# Patient Record
Sex: Male | Born: 1970 | Race: White | Hispanic: No | Marital: Single | Smoking: Never smoker
Health system: Southern US, Community
[De-identification: ages and names within clinical notes are randomized; demographics above are authoritative.]

## PROBLEM LIST (undated history)

## (undated) DIAGNOSIS — R319 Hematuria, unspecified: Secondary | ICD-10-CM

## (undated) DIAGNOSIS — K219 Gastro-esophageal reflux disease without esophagitis: Secondary | ICD-10-CM

## (undated) DIAGNOSIS — G4730 Sleep apnea, unspecified: Secondary | ICD-10-CM

## (undated) DIAGNOSIS — N200 Calculus of kidney: Secondary | ICD-10-CM

## (undated) DIAGNOSIS — I10 Essential (primary) hypertension: Secondary | ICD-10-CM

## (undated) DIAGNOSIS — N209 Urinary calculus, unspecified: Secondary | ICD-10-CM

## (undated) HISTORY — DX: Gastro-esophageal reflux disease without esophagitis: K21.9

## (undated) HISTORY — PX: KNEE SURGERY: SHX244

## (undated) HISTORY — DX: Hematuria, unspecified: R31.9

## (undated) HISTORY — DX: Sleep apnea, unspecified: G47.30

---

## 2008-01-26 ENCOUNTER — Ambulatory Visit: Payer: Self-pay | Admitting: Family Medicine

## 2008-05-31 ENCOUNTER — Emergency Department: Payer: Self-pay | Admitting: Emergency Medicine

## 2012-08-01 HISTORY — PX: LAPAROSCOPIC GASTRIC SLEEVE RESECTION: SHX5895

## 2013-02-08 ENCOUNTER — Ambulatory Visit: Payer: Self-pay | Admitting: Orthopedic Surgery

## 2013-03-12 ENCOUNTER — Ambulatory Visit: Payer: Self-pay | Admitting: Orthopedic Surgery

## 2013-03-19 ENCOUNTER — Ambulatory Visit: Payer: Self-pay | Admitting: Orthopedic Surgery

## 2013-04-10 ENCOUNTER — Other Ambulatory Visit (HOSPITAL_COMMUNITY): Payer: Self-pay | Admitting: Marriage and Family Therapist

## 2013-04-10 ENCOUNTER — Other Ambulatory Visit: Payer: Self-pay | Admitting: Bariatrics

## 2013-04-11 ENCOUNTER — Ambulatory Visit: Payer: Self-pay | Admitting: Bariatrics

## 2013-04-11 LAB — COMPREHENSIVE METABOLIC PANEL
Anion Gap: 8 (ref 7–16)
Bilirubin,Total: 0.5 mg/dL (ref 0.2–1.0)
Chloride: 107 mmol/L (ref 98–107)
Co2: 24 mmol/L (ref 21–32)
Creatinine: 0.87 mg/dL (ref 0.60–1.30)
Sodium: 139 mmol/L (ref 136–145)
Total Protein: 7.3 g/dL (ref 6.4–8.2)

## 2013-04-11 LAB — CBC WITH DIFFERENTIAL/PLATELET
Eosinophil #: 0.4 10*3/uL (ref 0.0–0.7)
Eosinophil %: 2.7 %
HCT: 47.1 % (ref 40.0–52.0)
Lymphocyte %: 18.1 %
MCHC: 33.2 g/dL (ref 32.0–36.0)
MCV: 79 fL — ABNORMAL LOW (ref 80–100)
Monocyte %: 6.9 %
RDW: 13.9 % (ref 11.5–14.5)
WBC: 13.4 10*3/uL — ABNORMAL HIGH (ref 3.8–10.6)

## 2013-04-11 LAB — IRON AND TIBC
Iron Bind.Cap.(Total): 389 ug/dL (ref 250–450)
Iron Saturation: 17 %
Iron: 66 ug/dL (ref 65–175)

## 2013-04-11 LAB — PROTIME-INR: Prothrombin Time: 12.8 secs (ref 11.5–14.7)

## 2013-04-11 LAB — FOLATE: Folic Acid: 12.1 ng/mL (ref 3.1–100.0)

## 2013-04-11 LAB — TSH: Thyroid Stimulating Horm: 1.4 u[IU]/mL

## 2013-04-11 LAB — AMYLASE: Amylase: 38 U/L (ref 25–115)

## 2013-04-22 ENCOUNTER — Other Ambulatory Visit: Payer: Self-pay

## 2013-04-25 ENCOUNTER — Ambulatory Visit: Payer: Self-pay | Admitting: Bariatrics

## 2013-05-01 ENCOUNTER — Ambulatory Visit: Payer: Self-pay | Admitting: Bariatrics

## 2013-05-03 ENCOUNTER — Ambulatory Visit: Payer: Self-pay | Admitting: Gastroenterology

## 2013-05-23 ENCOUNTER — Emergency Department: Payer: Self-pay | Admitting: Emergency Medicine

## 2013-05-23 LAB — COMPREHENSIVE METABOLIC PANEL
Albumin: 4.1 g/dL (ref 3.4–5.0)
Alkaline Phosphatase: 96 U/L (ref 50–136)
Anion Gap: 12 (ref 7–16)
BUN: 22 mg/dL — ABNORMAL HIGH (ref 7–18)
Bilirubin,Total: 0.4 mg/dL (ref 0.2–1.0)
Calcium, Total: 10 mg/dL (ref 8.5–10.1)
EGFR (Non-African Amer.): >60
Osmolality: 285 (ref 275–301)
Potassium: 3.5 mmol/L (ref 3.5–5.1)
SGOT(AST): 21 U/L (ref 15–37)
Total Protein: 7.8 g/dL (ref 6.4–8.2)

## 2013-05-23 LAB — CBC WITH DIFFERENTIAL/PLATELET
Basophil #: 0.2 10*3/uL — ABNORMAL HIGH (ref 0.0–0.1)
Basophil %: 1.1 %
Eosinophil %: 2.5 %
HGB: 16 g/dL (ref 13.0–18.0)
Lymphocyte %: 37.1 %
MCHC: 32.8 g/dL (ref 32.0–36.0)
MCV: 79 fL — ABNORMAL LOW (ref 80–100)
Monocyte #: 1.2 x10 3/mm — ABNORMAL HIGH (ref 0.2–1.0)
Neutrophil #: 7.1 10*3/uL — ABNORMAL HIGH (ref 1.4–6.5)
Neutrophil %: 50.7 %
Platelet: 410 10*3/uL (ref 150–440)
RDW: 14 % (ref 11.5–14.5)

## 2013-05-23 LAB — URINALYSIS, COMPLETE
Bacteria: NONE SEEN
Bilirubin,UR: NEGATIVE
Glucose,UR: NEGATIVE mg/dL (ref 0–75)
Nitrite: NEGATIVE
Specific Gravity: 1.027 (ref 1.003–1.030)
Squamous Epithelial: <1

## 2013-05-23 LAB — LIPASE, BLOOD: Lipase: 71 U/L — ABNORMAL LOW (ref 73–393)

## 2013-06-13 DIAGNOSIS — R350 Frequency of micturition: Secondary | ICD-10-CM | POA: Insufficient documentation

## 2013-06-13 DIAGNOSIS — R351 Nocturia: Secondary | ICD-10-CM | POA: Insufficient documentation

## 2014-11-21 NOTE — Op Note (Signed)
PATIENT NAME:  [name], [name] MR#:  [id number] DATE OF BIRTH:  04-19-1971  DATE OF PROCEDURE:  03/19/2013  PREOPERATIVE DIAGNOSES: Degenerative changes, right knee; degenerative tear, medial meniscus, lateral meniscal fraying.   POSTOPERATIVE DIAGNOSES:  Tricompartmental cartilage wear, deficient posterior horn, medial meniscus; moderate fraying of free edge of lateral meniscus of the body and posterior horn.   SURGEON: Murlean HarkShalini Mahad Newstrom, M.D.   ASSISTANT: None.   ANESTHESIA: General anesthesia.   TOURNIQUET TIME: Tourniquet was not inflated.   FINDINGS: Arthroscopy reveals  grade III chondral wear, medial compartment; grade IV chondral wear, trochlear groove; grade III chondral wear, lateral tibial plateau; moderate fraying of the body and posterior horn of the lateral meniscus, posterior horn of the medial meniscus was found to be stable but very thin.   COMPLICATIONS: No immediate intraoperative or postoperative complications were noted.   INDICATIONS FOR PROCEDURE: [name] is a 44 year old gentleman who presented to my office multiple times over the past several months for evaluation of knee pain. After undergoing an extensive conservative course of treatment, including corticosteroid injections and extensive physical therapy and anti-inflammatory treatment, he ultimately decided to pursue arthroscopic intervention, given his ongoing pain and dysfunction. Risks and benefits were explained to the patient. Informed consent was obtained.   DESCRIPTION OF PROCEDURE: [name] was identified in the preoperative holding area. Right lower extremity was marked as the operative site. Informed consent was reviewed and questions are answered.   The patient was brought into the operating room and placed on the table in supine position. General anesthesia was administered. Right lower extremity had a tourniquet applied. The right lower extremity was prepared and draped in the usual sterile  fashion.   Surgical timeout was performed identifying the patient, procedure, confirming antibiotics, confirming images, confirming laterality, confirming consent form, administration of preoperative antibiotics and allergies.   Standard lateral viewing portal was made and arthroscope was inserted. Under direct visualization, medial portal was made and probe was inserted. Due to extensive synovitis in the patellofemoral articulation, the medial compartment was evaluated first. Medial compartment demonstrated grade III chondral wear on both the femoral side and the tibial side. There was extensive fraying of the femoral condyle. Evaluation of the medial meniscus revealed the body and anterior portion relatively well preserved. The posterior portion demonstrated no evidence of significant or unstable tearing, however, did demonstrate a very deficient posterior horn that was quite thin. Attention was now turned to the notch. ACL and PCL were found to be intact. Attention was turned to the lateral compartment. Lateral femoral condyle was relatively well preserved with only slight softening of the cartilage. The tibial plateau was significantly more worn with grade III cartilage wear and extensive fraying. Lateral meniscus demonstrated moderate fraying of the free edge of the posterior horn and mild fraying of the free edge of the body. At this time, a shaver was inserted. Collene MaresShaver was used to smooth the free edge of the lateral meniscus back to a clean, stable border. A full-radius shaver was then inserted to perform a chondroplasty of the lateral tibial plateau, as well as the medial femoral condyle and medial tibial plateau. Probe was used to assure that no unstable fragments of cartilage remained. Attention was now turned to the anterior portion of the joint, and an extensive synovectomy was carried out to improve visualization of the patellofemoral articulation. Evaluation of the patellofemoral articulation  revealed a large full-thickness cartilage flap in the trochlear groove. The flap was completely unstable along its course.  It measured approximately 1.5 cm in length and 1 cm in width. A full-radius chondral shaver was used to debride this back to a stable edge. The undersurface of the patella demonstrated grade I to II cartilage changes without extensive fraying. Medial and lateral gutters were checked for loose bodies. The joint was copiously irrigated and drained of excess fluid. At this time, the arthroscope and instruments were removed from the joint and portals were closed using nylon suture. Marcaine 0.25% was inserted subcutaneously. Sterile dressings were applied. The patient was placed in a hinged knee brace locked in extension. He will follow up in 2 days and begin physical therapy then.      ____________________________ Murlean Hark, MD sr:dmm D: 03/21/2013 10:02:25 ET T: 03/21/2013 10:14:51 ET JOB#: [id number]  cc: Murlean Hark, MD, <Dictator> Murlean Hark MD ELECTRONICALLY SIGNED 03/26/2013 10:26

## 2016-01-20 ENCOUNTER — Encounter: Payer: Self-pay | Admitting: Urology

## 2016-01-20 ENCOUNTER — Ambulatory Visit (INDEPENDENT_AMBULATORY_CARE_PROVIDER_SITE_OTHER): Payer: BC Managed Care – PPO | Admitting: Urology

## 2016-01-20 VITALS — BP 132/87 | HR 64 | Ht 66.0 in | Wt 193.0 lb

## 2016-01-20 DIAGNOSIS — R310 Gross hematuria: Secondary | ICD-10-CM

## 2016-01-20 DIAGNOSIS — Z87442 Personal history of urinary calculi: Secondary | ICD-10-CM | POA: Diagnosis not present

## 2016-01-20 LAB — URINALYSIS, COMPLETE
BILIRUBIN UA: NEGATIVE
GLUCOSE, UA: NEGATIVE
KETONES UA: NEGATIVE
Leukocytes, UA: NEGATIVE
NITRITE UA: NEGATIVE
Protein, UA: NEGATIVE
SPEC GRAV UA: 1.025 (ref 1.005–1.030)
UUROB: 0.2 mg/dL (ref 0.2–1.0)
pH, UA: 7 (ref 5.0–7.5)

## 2016-01-20 LAB — MICROSCOPIC EXAMINATION
Bacteria, UA: NONE SEEN
EPITHELIAL CELLS (NON RENAL): NONE SEEN /HPF (ref 0–10)

## 2016-01-20 NOTE — Progress Notes (Addendum)
01/20/2016 3:24 PM   Lucila MainePeter W Chojnowski [birth year] [id number]  Referring provider: Dione HousekeeperMario Ernesto Olmedo, MD [address] [place], [place] [id number]  Chief Complaint  Patient presents with  . Hematuria    referred by   . New Patient (Initial Visit)    HPI: Patient is a 45 -year-old Caucasian male who presents today as a referral from their PCP, Olmedo, for gross hematuria.    Patient doesn't have a prior history of microscopic hematuria.    He does not have a prior history of recurrent urinary tract infections,  trauma to the genitourinary tract, BPH or malignancies of the genitourinary tract.  He does have a history of nephrolithiasis.    He does not have a family medical history of nephrolithiasis or hematuria.  Father was diagnosed with prostate cancer at age 47.  Today, he is not having symptoms of frequent urination, urgency, dysuria, nocturia, incontinence, hesitancy, intermittency, straining to urinate or a weak urinary stream.  His UA today demonstrates 0-2 RBC's/hpf.  He had been experiencing one month of suprapubic discomfort and intermittent episodes of gross hematuria.Marland Kitchen.  He is not experiencing any suprapubic pain, abdominal pain or flank pain. He denies any recent fevers, chills, nausea or vomiting.   He did have a non contrast CT which demonstrated mild hydronephrosis and hydroureter secondary to a 1 mm stone. had any recent imaging studies.     He is not a smoker.   He is not exposed to secondhand smoke.  He has not worked with [employer].    PMH: Past Medical History  Diagnosis Date  . Acid reflux   . Sleep apnea   . Hematuria     Surgical History: Past Surgical History  Procedure Laterality Date  . Laparoscopic gastric sleeve resection    . Knee surgery      Home Medications:    Medication List       This list is accurate as of: 01/20/16  3:24 PM.  Always use your most recent med list.               omeprazole 20 MG capsule    Commonly known as:  PRILOSEC  Take by mouth.        Allergies:  Allergies  Allergen Reactions  . Penicillins Itching and Rash    Family History: Family History  Problem Relation Age of Onset  . Prostate cancer Father   . Kidney disease Neg Hx   . Kidney cancer Neg Hx     Social History:  reports that he has never smoked. He does not have any smokeless tobacco history on file. He reports that he drinks alcohol. He reports that he does not use illicit drugs.  ROS: UROLOGY Frequent Urination?: No Hard to postpone urination?: No Burning/pain with urination?: No Get up at night to urinate?: No Leakage of urine?: No Urine stream starts and stops?: No Trouble starting stream?: No Do you have to strain to urinate?: No Blood in urine?: Yes Urinary tract infection?: No Sexually transmitted disease?: No Injury to kidneys or bladder?: No Painful intercourse?: No Weak stream?: No Erection problems?: No Penile pain?: No  Gastrointestinal Nausea?: No Vomiting?: No Indigestion/heartburn?: Yes Diarrhea?: No Constipation?: No  Constitutional Fever: No Night sweats?: No Weight loss?: No Fatigue?: No  Skin Skin rash/lesions?: No Itching?: No  Eyes Blurred vision?: No Double vision?: No  Ears/Nose/Throat Sore throat?: No Sinus problems?: No  Hematologic/Lymphatic Swollen glands?: No Easy bruising?: No  Cardiovascular  Leg swelling?: No Chest pain?: No  Respiratory Cough?: No Shortness of breath?: No  Endocrine Excessive thirst?: No  Musculoskeletal Back pain?: No Joint pain?: No  Neurological Headaches?: Yes Dizziness?: No  Psychologic Depression?: No Anxiety?: No  Physical Exam: BP 132/87 mmHg  Pulse 64  Ht  (1.676 m)  Wt 193 lb (87.544 kg)  BMI 31.17 kg/m2  Constitutional: Well nourished. Alert and oriented, No acute distress. HEENT: [place] AT, moist mucus membranes. Trachea midline, no masses. Cardiovascular: No clubbing,  cyanosis, or edema. Respiratory: Normal respiratory effort, no increased work of breathing. GI: Abdomen is soft, non tender, non distended, no abdominal masses. Liver and spleen not palpable.  No hernias appreciated.  Stool sample for occult testing is not indicated.   GU: No CVA tenderness.  No bladder fullness or masses.  Patient with circumcised phallus.  Urethral meatus is patent.  No penile discharge. No penile lesions or rashes. Scrotum without lesions, cysts, rashes and/or edema.  Testicles are located scrotally bilaterally. No masses are appreciated in the testicles. Left and right epididymis are normal. Rectal: Patient with  normal sphincter tone. Anus and perineum without scarring or rashes. No rectal masses are appreciated. Prostate is approximately 45 grams, no nodules are appreciated. Seminal vesicles are normal. Skin: No rashes, bruises or suspicious lesions. Lymph: No cervical or inguinal adenopathy. Neurologic: Grossly intact, no focal deficits, moving all 4 extremities. Psychiatric: Normal mood and affect.  Laboratory Data: Lab Results  Component Value Date   WBC 14.0* 05/23/2013   HGB 16.0 05/23/2013   HCT 48.8 05/23/2013   MCV 79* 05/23/2013   PLT 410 05/23/2013    Lab Results  Component Value Date   CREATININE 1.07 05/23/2013     Lab Results  Component Value Date   HGBA1C 5.7 04/11/2013    Lab Results  Component Value Date   TSH 1.40 04/11/2013      Lab Results  Component Value Date   AST 21 05/23/2013   Lab Results  Component Value Date   ALT 49 05/23/2013     Urinalysis Results for orders placed or performed in visit on 01/20/16  CULTURE, URINE COMPREHENSIVE  Result Value Ref Range   Urine Culture, Comprehensive Final report    Result 1 Comment   Microscopic Examination  Result Value Ref Range   WBC, UA 0-5 0 -  5 /hpf   RBC, UA 0-2 0 -  2 /hpf   Epithelial Cells (non renal) None seen 0 - 10 /hpf   Bacteria, UA None seen None seen/Few   Urinalysis, Complete  Result Value Ref Range   Specific Gravity, UA 1.025 1.005 - 1.030   pH, UA 7.0 5.0 - 7.5   Color, UA Yellow Yellow   Appearance Ur Clear Clear   Leukocytes, UA Negative Negative   Protein, UA Negative Negative/Trace   Glucose, UA Negative Negative   Ketones, UA Negative Negative   RBC, UA 1+ (A) Negative   Bilirubin, UA Negative Negative   Urobilinogen, Ur 0.2 0.2 - 1.0 mg/dL   Nitrite, UA Negative Negative   Microscopic Examination See below:   BUN+Creat  Result Value Ref Range   BUN 14 6 - 24 mg/dL   Creatinine, Ser 7.82 0.76 - 1.27 mg/dL   GFR calc non Af Amer 107 >59 mL/min/1.73   GFR calc Af Amer 124 >59 mL/min/1.73   BUN/Creatinine Ratio 18 9 - 20    Pertinent Imaging: PRIOR REPORT IMPORTED FROM THE [employer] WORKFLOW SYSTEM  REASON FOR EXAM: flank pain  COMMENTS:   PROCEDURE: CT - CT ABDOMEN /PELVIS WO (STONE) - May 23 2013 6:53PM   RESULT: Axial noncontrast CT scanning was performed through the  abdomen  and pelvis with reconstructions at 3 mm intervals and slice thicknesses.  Review of multiplanar reconstructed images was performed separately on the  VIA monitor.   There is mild hydronephrosis and hydroureter on the left. This is due to a  1  mm diameter stone on image 134 in the distal left ureter. No other stones  are demonstrated on the left. There is likely an exophytic cyst from the  lower pole posterior laterally. On the right there is a punctate mid to  upper pole stone not producing obstruction. The partially distended  urinary  bladder is normal in appearance.   The liver, gallbladder, spleen, partially distended stomach, pancreas, and  adrenal glands are normal in appearance. The caliber of the abdominal  aorta  is normal. There is a retroaortic left renal vein. Unopacified loops of  small and large bowel exhibit no evidence of ileus nor of obstruction.   The lumbar vertebral bodies  are preserved in height. The lung bases are  clear.   IMPRESSION:  1. There is mild hydronephrosis and hydroureter secondary to a 1 mm  diameter  stone in the distal third of the left ureter visible on image 134. No  other  stones on the left are demonstrated.  2. On the right there is a punctate mid upper pole stone not producing  obstruction.  3. There is no acute hepatobiliary abnormality nor acute bowel  abnormality.  4. There is no intra-abdominal nor pelvic lymphadenopathy or inflammatory  change.    Assessment & Plan:    1. Gross hematuria:    I explained to the patient that there are a number of causes that can be associated with blood in the urine, such as stones,  BPH, UTI's, damage to the urinary tract and/or cancer.  At this time, I felt that the patient warranted further urologic evaluation.   The AUA guidelines state that a CT urogram is the preferred imaging study to evaluate hematuria.  I explained to the patient that a contrast material will be injected into a vein and that in rare instances, an allergic reaction can result and may even life threatening   The patient denies any allergies to contrast, iodine and/or seafood and is not taking metformin.  Following the imaging study,  I've recommended a cystoscopy. I described how this is performed, typically in an office setting with a flexible cystoscope. We described the risks, benefits, and possible side effects, the most common of which is a minor amount of blood in the urine and/or burning which usually resolves in 24 to 48 hours.   The patient had the opportunity to ask questions which were answered. Based upon this discussion, the patient is willing to proceed. Therefore, I've ordered: a CT Urogram and cystoscopy.  He will return following all of the above for discussion of the results.     - Urinalysis, Complete - CULTURE, URINE COMPREHENSIVE - BUN+Creat  2. History of nephrolithiasis:   Stone  composition is unknown at this time.  See above.     Return for CT Urogram report and cystoscopy for hematuria.  These notes generated with voice recognition software. I apologize for typographical errors.  Michiel Cowboy, PA-C  [facility] [address], [address] [place], [place] [address] [phone]  02/20/2016: Addendum-patient was seen in the emergency room at [facility] on 02/19/2016. He was found to have a 3 mm left UVJ stone and a right UPJ stone. Patient is encouraged to drink copious amounts of fluid (2.5 mL) and continue tamsulosin for medical expulsive therapy for the left UVJ stone.  Patient will be traveling out of the state next week.   When he returns, he will undergo right ESWL for the right UPJ stone. The procedure is explained to the patient.  I stated that the ESWL may not be successful in fragmenting the stones due to the position and size. I also explained the phenomenon of Ste[place]rausse with the stones that would require a ureteral stent placement. The effectiveness of the treatment should reach 88%.   His questions are answered and he wishes to proceed.

## 2016-01-21 LAB — BUN+CREAT
BUN / CREAT RATIO: 18 (ref 9–20)
BUN: 14 mg/dL (ref 6–24)
CREATININE: 0.78 mg/dL (ref 0.76–1.27)
GFR calc Af Amer: 124 mL/min/{1.73_m2} (ref >59)
GFR, EST NON AFRICAN AMERICAN: 107 mL/min/{1.73_m2} (ref >59)

## 2016-01-24 LAB — CULTURE, URINE COMPREHENSIVE

## 2016-01-25 ENCOUNTER — Telehealth: Payer: Self-pay | Admitting: Urology

## 2016-01-25 NOTE — Telephone Encounter (Signed)
Would you please send a copy of this office visit to the patient's referring provider, Dr. Olmedo?     

## 2016-01-25 NOTE — Telephone Encounter (Signed)
faxed

## 2016-02-03 ENCOUNTER — Ambulatory Visit
Admission: RE | Admit: 2016-02-03 | Discharge: 2016-02-03 | Disposition: A | Discharge status: Home / Self Care | Payer: Self-pay | Source: Ambulatory Visit | Attending: Urology | Admitting: Urology

## 2016-02-03 DIAGNOSIS — N281 Cyst of kidney, acquired: Secondary | ICD-10-CM | POA: Insufficient documentation

## 2016-02-03 DIAGNOSIS — N200 Calculus of kidney: Secondary | ICD-10-CM | POA: Insufficient documentation

## 2016-02-03 DIAGNOSIS — Z9049 Acquired absence of other specified parts of digestive tract: Secondary | ICD-10-CM | POA: Insufficient documentation

## 2016-02-03 DIAGNOSIS — Z9884 Bariatric surgery status: Secondary | ICD-10-CM | POA: Insufficient documentation

## 2016-02-03 DIAGNOSIS — R310 Gross hematuria: Secondary | ICD-10-CM | POA: Insufficient documentation

## 2016-02-03 MED ORDER — IOPAMIDOL (ISOVUE-370) INJECTION 76%
125.0000 mL | Freq: Once | INTRAVENOUS | Status: AC | PRN
  Administered 2016-02-03: 125 mL via INTRAVENOUS

## 2016-02-08 ENCOUNTER — Telehealth: Payer: Self-pay

## 2016-02-08 ENCOUNTER — Telehealth: Payer: Self-pay | Admitting: Urology

## 2016-02-08 NOTE — Telephone Encounter (Signed)
Spoke with patient and he agreed  [name]

## 2016-02-08 NOTE — Telephone Encounter (Signed)
-----   Message from Harle BattiestShannon A McGowan, PA-C sent at 02/03/2016  5:23 PM EDT ----- Please let the patient know that he has a stone in his right kidney. I do strongly encouraged him to keep his cystoscopic appointment on August 2, so that he can discuss possible treatment for the stone

## 2016-02-08 NOTE — Telephone Encounter (Signed)
LMOM- stone in right kidney. Keep Aug 2 appt.

## 2016-02-19 ENCOUNTER — Emergency Department: Payer: BC Managed Care – PPO

## 2016-02-19 ENCOUNTER — Encounter: Payer: Self-pay | Admitting: Urgent Care

## 2016-02-19 ENCOUNTER — Telehealth: Payer: Self-pay

## 2016-02-19 ENCOUNTER — Emergency Department
Admission: EM | Admit: 2016-02-19 | Discharge: 2016-02-19 | Disposition: A | Discharge status: Home / Self Care | Payer: BC Managed Care – PPO | Attending: Emergency Medicine | Admitting: Emergency Medicine

## 2016-02-19 DIAGNOSIS — Z79899 Other long term (current) drug therapy: Secondary | ICD-10-CM | POA: Insufficient documentation

## 2016-02-19 DIAGNOSIS — N200 Calculus of kidney: Secondary | ICD-10-CM | POA: Insufficient documentation

## 2016-02-19 HISTORY — DX: Urinary calculus, unspecified: N20.9

## 2016-02-19 LAB — CBC
HEMATOCRIT: 42.5 % (ref 40.0–52.0)
HEMOGLOBIN: 14.2 g/dL (ref 13.0–18.0)
MCH: 27.6 pg (ref 26.0–34.0)
MCHC: 33.5 g/dL (ref 32.0–36.0)
MCV: 82.4 fL (ref 80.0–100.0)
Platelets: 295 10*3/uL (ref 150–440)
RBC: 5.16 MIL/uL (ref 4.40–5.90)
RDW: 13.1 % (ref 11.5–14.5)
WBC: 8.1 10*3/uL (ref 3.8–10.6)

## 2016-02-19 LAB — BASIC METABOLIC PANEL
Anion gap: 8 (ref 5–15)
BUN: 22 mg/dL — AB (ref 6–20)
CHLORIDE: 105 mmol/L (ref 101–111)
CO2: 28 mmol/L (ref 22–32)
CREATININE: 0.91 mg/dL (ref 0.61–1.24)
Calcium: 9.2 mg/dL (ref 8.9–10.3)
GFR calc Af Amer: >60 mL/min (ref >60)
GFR calc non Af Amer: >60 mL/min (ref >60)
GLUCOSE: 125 mg/dL — AB (ref 65–99)
Potassium: 3.7 mmol/L (ref 3.5–5.1)
SODIUM: 141 mmol/L (ref 135–145)

## 2016-02-19 LAB — URINALYSIS COMPLETE WITH MICROSCOPIC (ARMC ONLY)
BACTERIA UA: NONE SEEN
Bilirubin Urine: NEGATIVE
Glucose, UA: NEGATIVE mg/dL
Ketones, ur: NEGATIVE mg/dL
Leukocytes, UA: NEGATIVE
Nitrite: NEGATIVE
PH: 7 (ref 5.0–8.0)
PROTEIN: 30 mg/dL — AB
Specific Gravity, Urine: 1.015 (ref 1.005–1.030)
Squamous Epithelial / LPF: NONE SEEN

## 2016-02-19 MED ORDER — ONDANSETRON HCL 4 MG/2ML IJ SOLN
4.0000 mg | Freq: Once | INTRAMUSCULAR | Status: AC
  Administered 2016-02-19: 4 mg via INTRAVENOUS

## 2016-02-19 MED ORDER — KETOROLAC TROMETHAMINE 30 MG/ML IJ SOLN
30.0000 mg | Freq: Once | INTRAMUSCULAR | Status: AC
  Administered 2016-02-19: 30 mg via INTRAVENOUS
  Filled 2016-02-19: qty 1

## 2016-02-19 MED ORDER — TAMSULOSIN HCL 0.4 MG PO CAPS
0.4000 mg | ORAL_CAPSULE | Freq: Once | ORAL | Status: AC
  Administered 2016-02-19: 0.4 mg via ORAL
  Filled 2016-02-19: qty 1

## 2016-02-19 MED ORDER — MORPHINE SULFATE (PF) 4 MG/ML IV SOLN
4.0000 mg | Freq: Once | INTRAVENOUS | Status: AC
Start: 2016-02-19 — End: 2016-02-19
  Administered 2016-02-19: 4 mg via INTRAVENOUS

## 2016-02-19 MED ORDER — ONDANSETRON HCL 4 MG/2ML IJ SOLN
INTRAMUSCULAR | Status: AC
  Administered 2016-02-19: 4 mg via INTRAVENOUS
  Filled 2016-02-19: qty 2

## 2016-02-19 MED ORDER — SODIUM CHLORIDE 0.9 % IV BOLUS (SEPSIS)
1000.0000 mL | Freq: Once | INTRAVENOUS | Status: AC
  Administered 2016-02-19: 1000 mL via INTRAVENOUS

## 2016-02-19 MED ORDER — MORPHINE SULFATE (PF) 4 MG/ML IV SOLN
INTRAVENOUS | Status: AC
  Administered 2016-02-19: 4 mg via INTRAVENOUS
  Filled 2016-02-19: qty 1

## 2016-02-19 MED ORDER — TAMSULOSIN HCL 0.4 MG PO CAPS: 0.4000 mg | ORAL_CAPSULE | Freq: Every day | ORAL | Status: DC

## 2016-02-19 MED ORDER — OXYCODONE-ACETAMINOPHEN 5-325 MG PO TABS: 1.0000 | ORAL_TABLET | ORAL | Status: DC | PRN

## 2016-02-19 MED ORDER — ONDANSETRON 4 MG PO TBDP
4.0000 mg | ORAL_TABLET | Freq: Three times a day (TID) | ORAL | Status: DC | PRN
Start: 2016-02-19 — End: 2016-03-24

## 2016-02-19 NOTE — ED Provider Notes (Signed)
[facility] Emergency Department Provider Note  ____________________________________________  Time seen: 3:15 AM I have reviewed the triage vital signs and the nursing notes.   HISTORY  Chief Complaint Flank Pain      HPI LAMOUNT BANKSON is a 45 y.o. male presents with left flank pain acute onset 2 hours accompanied with nausea and vomiting. Patient states known right sided urolithiasis for which she seen [facility] urology with follow-up appointment scheduled for 2 weeks. Patient admits to hematuria.. Patient denies any fever. Patient states that he was not prescribed anything for pain or Flomax.     Past Medical History  Diagnosis Date  . Acid reflux   . Sleep apnea   . Hematuria   . Urolithiasis     There are no active problems to display for this patient.   Past Surgical History  Procedure Laterality Date  . Laparoscopic gastric sleeve resection    . Knee surgery      Current Outpatient Rx  Name  Route  Sig  Dispense  Refill  . omeprazole (PRILOSEC) 20 MG capsule   Oral   Take by mouth.           Allergies Penicillins  Family History  Problem Relation Age of Onset  . Prostate cancer Father   . Kidney disease Neg Hx   . Kidney cancer Neg Hx     Social History Social History  Substance Use Topics  . Smoking status: Never Smoker   . Smokeless tobacco: None  . Alcohol Use: 0.0 oz/week    0 Standard drinks or equivalent per week    Review of Systems  Constitutional: Negative for fever. Eyes: Negative for visual changes. ENT: Negative for sore throat. Cardiovascular: Negative for chest pain. Respiratory: Negative for shortness of breath. Gastrointestinal: Negative for abdominal pain, vomiting and diarrhea. Genitourinary: Negative for dysuria. Musculoskeletal: Positivefor back pain. Skin: Negative for rash. Neurological: Negative for headaches, focal weakness or numbness.  10-point ROS otherwise  negative.  ____________________________________________   PHYSICAL EXAM:  VITAL SIGNS: ED Triage Vitals  Enc Vitals Group     BP 02/19/16 0111 133/86 mmHg     Pulse Rate 02/19/16 0111 64     Resp 02/19/16 0111 18     Temp 02/19/16 0111 97.5 F (36.4 C)     Temp Source 02/19/16 0111 Oral     SpO2 02/19/16 0111 100 %     Weight 02/19/16 0111 190 lb (86.183 kg)     Height 02/19/16 0111 5\' 6"  (1.676 m)     Head Cir --      Peak Flow --      Pain Score 02/19/16 0112 7     Pain Loc --      Pain Edu? --      Excl. in GC? --      Constitutional: Alert and oriented. Apparent discomfort Eyes: Conjunctivae are normal. PERRL. Normal extraocular movements. ENT   Head: Normocephalic and atraumatic.   Nose: No congestion/rhinnorhea.   Mouth/Throat: Mucous membranes are moist.   Neck: No stridor. Hematological/Lymphatic/Immunilogical: No cervical lymphadenopathy. Cardiovascular: Normal rate, regular rhythm. Normal and symmetric distal pulses are present in all extremities. No murmurs, rubs, or gallops. Respiratory: Normal respiratory effort without tachypnea nor retractions. Breath sounds are clear and equal bilaterally. No wheezes/rales/rhonchi. Gastrointestinal: Soft and nontender. No distention. There is no CVA tenderness. Genitourinary: deferred Musculoskeletal: Nontender with normal range of motion in all extremities. No joint effusions.  No lower extremity tenderness nor  edema. Neurologic:  Normal speech and language. No gross focal neurologic deficits are appreciated. Speech is normal.  Skin:  Skin is warm, dry and intact. No rash noted. Psychiatric: Mood and affect are normal. Speech and behavior are normal. Patient exhibits appropriate insight and judgment.  ____________________________________________    LABS (pertinent positives/negatives)  Labs Reviewed  BASIC METABOLIC PANEL - Abnormal; Notable for the following:    Glucose, Bld 125 (*)    BUN 22 (*)     All other components within normal limits  URINALYSIS COMPLETEWITH MICROSCOPIC ([facility] ONLY) - Abnormal; Notable for the following:    Color, Urine YELLOW (*)    APPearance HAZY (*)    Hgb urine dipstick 3+ (*)    Protein, ur 30 (*)    All other components within normal limits  CBC         RADIOLOGY   CT Renal Stone Study (Final result) Result time: 02/19/16 02:02:10   Final result by Rad Results In Interface (02/19/16 02:02:10)   Narrative:   CLINICAL DATA: Left flank pain  EXAM: CT ABDOMEN AND PELVIS WITHOUT CONTRAST  TECHNIQUE: Multidetector CT imaging of the abdomen and pelvis was performed following the standard protocol without IV contrast.  COMPARISON: February 03, 2016  FINDINGS: There is a hiatal hernia. The lung bases are otherwise normal. No free air or free fluid. Again noted are multiple small stones in the right renal pelvis without active hydronephrosis or obstruction. A few small stones are scattered throughout the right kidney as well. There is a possible subtle stone in the mid left kidney on coronal images. Persistent hydronephrosis on the left, stable in the interval. The proximal left ureter is also prominent. There is a stone in the left ureter on series 2, image 54 measuring 3 mm, in retrospect present previously. There is been slight advancement of the stone down the ureter by only 1 or 2 cm. The ureters are otherwise normal. Multiple cysts and probable hyperdense cysts associated with the left kidney, unchanged.  The patient is status post cholecystectomy. The liver, spleen, adrenal glands, and pancreas are normal. Minimal atherosclerotic change in the non aneurysmal aorta. The patient is status post gastric surgery. The small bowel is normal. The colon is normal in appearance. The appendix is not seen but there is no secondary evidence of appendicitis. No adenopathy.  The pelvis demonstrates no adenopathy or mass. The bladder  is unremarkable. The prostate and seminal vesicles are within normal limits.  The visualized bones demonstrate scattered degenerative changes but no other acute bony abnormalities.  IMPRESSION: 1. There is a 3 mm stone in the left ureter with left-sided hydronephrosis. In retrospect, the stone was present on the February 03, 2016 study. This stone has only migrated distally 1 or 2 cm in the interval. 2. Stones in of the right renal pelvis without active obstruction. The stones could certainly cause intermittent obstruction given their location. 3. Mild atherosclerotic change in the abdominal aorta.   Electronically Signed By: Gerome Sam III M.D On: 02/19/2016 02:02        ECG Results       Procedures     INITIAL IMPRESSION / ASSESSMENT AND PLAN / ED COURSE  Pertinent labs & imaging results that were available during my care of the patient were reviewed by me and considered in my medical decision making (see chart for details).  Patient given morphine 4 mg in the emergency department as well as Zofran. Following confirmation on CT scan patient  received Toradol 30 mg current pain score 2 out of 10 in addition patient was given Flomax  ____________________________________________   FINAL CLINICAL IMPRESSION(S) / ED DIAGNOSES  Final diagnoses:  Bilateral kidney stones      Darci Currentandolph N Roel Douthat, MD 02/19/16 [phone]

## 2016-02-19 NOTE — Discharge Instructions (Signed)
Kidney Stones °Kidney stones (urolithiasis) are deposits that form inside your kidneys. The intense pain is caused by the stone moving through the urinary tract. When the stone moves, the ureter goes into spasm around the stone. The stone is usually passed in the urine.  °CAUSES  °· A disorder that makes certain neck glands produce too much parathyroid hormone (primary hyperparathyroidism). °· A buildup of uric acid crystals, similar to gout in your joints. °· Narrowing (stricture) of the ureter. °· A kidney obstruction present at birth (congenital obstruction). °· Previous surgery on the kidney or ureters. °· Numerous kidney infections. °SYMPTOMS  °· Feeling sick to your stomach (nauseous). °· Throwing up (vomiting). °· Blood in the urine (hematuria). °· Pain that usually spreads (radiates) to the groin. °· Frequency or urgency of urination. °DIAGNOSIS  °· Taking a history and physical exam. °· Blood or urine tests. °· CT scan. °· Occasionally, an examination of the inside of the urinary bladder (cystoscopy) is performed. °TREATMENT  °· Observation. °· Increasing your fluid intake. °· Extracorporeal shock wave lithotripsy--This is a noninvasive procedure that uses shock waves to break up kidney stones. °· Surgery may be needed if you have severe pain or persistent obstruction. There are various surgical procedures. Most of the procedures are performed with the use of small instruments. Only small incisions are needed to accommodate these instruments, so recovery time is minimized. °The size, location, and chemical composition are all important variables that will determine the proper choice of action for you. Talk to your health care provider to better understand your situation so that you will minimize the risk of injury to yourself and your kidney.  °HOME CARE INSTRUCTIONS  °· Drink enough water and fluids to keep your urine clear or pale yellow. This will help you to pass the stone or stone fragments. °· Strain  all urine through the provided strainer. Keep all particulate matter and stones for your health care provider to see. The stone causing the pain may be as small as a grain of salt. It is very important to use the strainer each and every time you pass your urine. The collection of your stone will allow your health care provider to analyze it and verify that a stone has actually passed. The stone analysis will often identify what you can do to reduce the incidence of recurrences. °· Only take over-the-counter or prescription medicines for pain, discomfort, or fever as directed by your health care provider. °· Keep all follow-up visits as told by your health care provider. This is important. °· Get follow-up X-rays if required. The absence of pain does not always mean that the stone has passed. It may have only stopped moving. If the urine remains completely obstructed, it can cause loss of kidney function or even complete destruction of the kidney. It is your responsibility to make sure X-rays and follow-ups are completed. Ultrasounds of the kidney can show blockages and the status of the kidney. Ultrasounds are not associated with any radiation and can be performed easily in a matter of minutes. °· Make changes to your daily diet as told by your health care provider. You may be told to: °¨ Limit the amount of salt that you eat. °¨ Eat 5 or more servings of fruits and vegetables each day. °¨ Limit the amount of meat, poultry, fish, and eggs that you eat. °· Collect a 24-hour urine sample as told by your health care provider. You may need to collect another urine sample every 6-12   months. °SEEK MEDICAL CARE IF: °· You experience pain that is progressive and unresponsive to any pain medicine you have been prescribed. °SEEK IMMEDIATE MEDICAL CARE IF:  °· Pain cannot be controlled with the prescribed medicine. °· You have a fever or shaking chills. °· The severity or intensity of pain increases over 18 hours and is not  relieved by pain medicine. °· You develop a new onset of abdominal pain. °· You feel faint or pass out. °· You are unable to urinate. °  °This information is not intended to replace advice given to you by your health care provider. Make sure you discuss any questions you have with your health care provider. °  °Document Released: 07/18/2005 Document Revised: 04/08/2015 Document Reviewed: 12/19/2012 °Elsevier Interactive Patient Education ©2016 [employer] ° °

## 2016-02-19 NOTE — Telephone Encounter (Signed)
Spoke with patient.  Instructed patient to increase his fluid intake to 2.5 mL daily, take the tamsulosin, oxycodone as needed and nausea medication as needed.  We will address the right pelvic stone when he returns on 03/03/2016 with ESWL.  I advised the patient of the risks of the procedure, particularly the issue of [place].

## 2016-02-19 NOTE — ED Notes (Signed)
Pt discharged to home.  Family member driving.  Discharge instructions reviewed.  Verbalized understanding.  No questions or concerns at this time.  Teach back verified.  Pt in NAD.  No items left in ED.   

## 2016-02-19 NOTE — Telephone Encounter (Signed)
Pt called stating he was in the ER last night due to kidney stone pain. Per pt ER physician told him he has a 3mm obstructing stone on the left side and a 9mm non obstructing stone on the right side. Pt stated "now I am very pissed off! I was told at my last appt that there would be no problem having this stone taken care of before I go to [place]. And now I leave Wednesday and I dont know if I am going to be able to have surgery!" please advise.

## 2016-02-19 NOTE — ED Notes (Signed)
Patient presents with LEFT flank pain with (+) radiation into groin. (+) N/V reported. Symptoms x 2 hours. Patient with known urolithiasis; had imaging done x 2 weeks ago; states, "I know that there are stones in there".

## 2016-03-02 ENCOUNTER — Ambulatory Visit: Payer: BC Managed Care – PPO

## 2016-03-03 ENCOUNTER — Ambulatory Visit
Admission: RE | Admit: 2016-03-03 | Discharge: 2016-03-03 | Disposition: A | Discharge status: Home / Self Care | Payer: BLUE CROSS/BLUE SHIELD | Source: Ambulatory Visit | Attending: Urology | Admitting: Urology

## 2016-03-03 ENCOUNTER — Encounter
Admission: RE | Disposition: A | Discharge status: Home / Self Care | Payer: Self-pay | Source: Ambulatory Visit | Attending: Urology

## 2016-03-03 ENCOUNTER — Encounter: Payer: Self-pay | Admitting: *Deleted

## 2016-03-03 ENCOUNTER — Ambulatory Visit: Payer: BLUE CROSS/BLUE SHIELD

## 2016-03-03 DIAGNOSIS — N200 Calculus of kidney: Secondary | ICD-10-CM | POA: Diagnosis not present

## 2016-03-03 DIAGNOSIS — Z9884 Bariatric surgery status: Secondary | ICD-10-CM | POA: Insufficient documentation

## 2016-03-03 HISTORY — PX: EXTRACORPOREAL SHOCK WAVE LITHOTRIPSY: SHX1557

## 2016-03-03 SURGERY — LITHOTRIPSY, ESWL
Anesthesia: Moderate Sedation | Laterality: Right

## 2016-03-03 MED ORDER — CIPROFLOXACIN HCL 500 MG PO TABS
ORAL_TABLET | ORAL | Status: AC
  Administered 2016-03-03: 500 mg via ORAL
  Filled 2016-03-03: qty 1

## 2016-03-03 MED ORDER — DIPHENHYDRAMINE HCL 25 MG PO CAPS
ORAL_CAPSULE | ORAL | Status: AC
  Administered 2016-03-03: 25 mg via ORAL
  Filled 2016-03-03: qty 1

## 2016-03-03 MED ORDER — DIAZEPAM 5 MG PO TABS
ORAL_TABLET | ORAL | Status: AC
  Administered 2016-03-03: 10 mg via ORAL
  Filled 2016-03-03: qty 2

## 2016-03-03 MED ORDER — DIAZEPAM 5 MG PO TABS
10.0000 mg | ORAL_TABLET | ORAL | Status: AC
  Administered 2016-03-03: 10 mg via ORAL

## 2016-03-03 MED ORDER — CIPROFLOXACIN HCL 500 MG PO TABS
500.0000 mg | ORAL_TABLET | ORAL | Status: AC
  Administered 2016-03-03: 500 mg via ORAL

## 2016-03-03 MED ORDER — DIPHENHYDRAMINE HCL 25 MG PO CAPS
25.0000 mg | ORAL_CAPSULE | ORAL | Status: AC
  Administered 2016-03-03: 25 mg via ORAL

## 2016-03-03 MED ORDER — DEXTROSE-NACL 5-0.45 % IV SOLN
INTRAVENOUS | Status: DC
  Administered 2016-03-03: 08:00:00 via INTRAVENOUS

## 2016-03-03 NOTE — H&P (Signed)
See paper H&P  Alex Scotland, MD

## 2016-03-03 NOTE — Discharge Instructions (Signed)
Lithotripsy, Care After °Refer to this sheet in the next few weeks. These instructions provide you with information on caring for yourself after your procedure. Your health care provider may also give you more specific instructions. Your treatment has been planned according to current medical practices, but problems sometimes occur. Call your health care provider if you have any problems or questions after your procedure. °WHAT TO EXPECT AFTER THE PROCEDURE  °· Your urine may have a red tinge for a few days after treatment. Blood loss is usually minimal. °· You may have soreness in the back or flank area. This usually goes away after a few days. The procedure can cause blotches or bruises on the back where the pressure wave enters the skin. These marks usually cause only minimal discomfort and should disappear in a short time. °· Stone fragments should begin to pass within 24 hours of treatment. However, a delayed passage is not unusual. °· You may have pain, discomfort, and feel sick to your stomach (nauseated) when the crushed fragments of stone are passed down the tube from the kidney to the bladder. Stone fragments can pass soon after the procedure and may last for up to 4-8 weeks. °· A small number of patients may have severe pain when stone fragments are not able to pass, which leads to an obstruction. °· If your stone is greater than 1 inch (2.5 cm) in diameter or if you have multiple stones that have a combined diameter greater than 1 inch (2.5 cm), you may require more than one treatment. °· If you had a stent placed prior to your procedure, you may experience some discomfort, especially during urination. You may experience the pain or discomfort in your flank or back, or you may experience a sharp pain or discomfort at the base of your penis or in your lower abdomen. The discomfort usually lasts only a few minutes after urinating. °HOME CARE INSTRUCTIONS  °· Rest at home until you feel your energy  improving. °· Only take over-the-counter or prescription medicines for pain, discomfort, or fever as directed by your health care provider. Depending on the type of lithotripsy, you may need to take antibiotics and anti-inflammatory medicines for a few days. °· Drink enough water and fluids to keep your urine clear or pale yellow. This helps "flush" your kidneys. It helps pass any remaining pieces of stone and prevents stones from coming back. °· Most people can resume daily activities within 1-2 days after standard lithotripsy. It can take longer to recover from laser and percutaneous lithotripsy. °· Strain all urine through the provided strainer. Keep all particulate matter and stones for your health care provider to see. The stone may be as small as a grain of salt. It is very important to use the strainer each and every time you pass your urine. Any stones that are found can be sent to a medical lab for examination. °· Visit your health care provider for a follow-up appointment in a few weeks. Your doctor may remove your stent if you have one. Your health care provider will also check to see whether stone particles still remain. °SEEK MEDICAL CARE IF:  °· Your pain is not relieved by medicine. °· You have a lasting nauseous feeling. °· You feel there is too much blood in the urine. °· You develop persistent problems with frequent or painful urination that does not at least partially improve after 2 days following the procedure. °· You have a congested cough. °· You feel   lightheaded.  You develop a rash or any other signs that might suggest an allergic problem.  You develop any reaction or side effects to your medicine(s). SEEK IMMEDIATE MEDICAL CARE IF:   You experience severe back or flank pain or both.  You see nothing but blood when you urinate.  You cannot pass any urine at all.  You have a fever or shaking chills.  You develop shortness of breath, difficulty breathing, or chest pain.  You  develop vomiting that will not stop after 6-8 hours.  You have a fainting episode.   This information is not intended to replace advice given to you by your health care provider. Make sure you discuss any questions you have with your health care provider.   Document Released: 08/07/2007 Document Revised: 04/08/2015 Document Reviewed: 01/31/2013 Elsevier Interactive Patient Education 2016 [employer].   See [facility] discharge instructions in chart.   AMBULATORY SURGERY  DISCHARGE INSTRUCTIONS   1) The drugs that you were given will stay in your system until tomorrow so for the next 24 hours you should not:  A) Drive an automobile B) Make any legal decisions C) Drink any alcoholic beverage   2) You may resume regular meals tomorrow.  Today it is better to start with liquids and gradually work up to solid foods.  You may eat anything you prefer, but it is better to start with liquids, then soup and crackers, and gradually work up to solid foods.   3) Please notify your doctor immediately if you have any unusual bleeding, trouble breathing, redness and pain at the surgery site, drainage, fever, or pain not relieved by medication.    4) Additional Instructions:        Please contact your physician with any problems or Same Day Surgery at [phone], Monday through Friday 6 am to 4 pm, or [place] at [facility] number at [phone].

## 2016-03-03 NOTE — Progress Notes (Signed)
Voided 200cc bloody urine   Strained   No stones

## 2016-03-04 ENCOUNTER — Telehealth: Payer: Self-pay

## 2016-03-04 ENCOUNTER — Encounter
Admission: EM | Disposition: A | Discharge status: Home / Self Care | Payer: Self-pay | Source: Home / Self Care | Attending: Emergency Medicine

## 2016-03-04 ENCOUNTER — Observation Stay
Admission: EM | Admit: 2016-03-04 | Discharge: 2016-03-05 | Disposition: A | Discharge status: Home / Self Care | Payer: BLUE CROSS/BLUE SHIELD | Attending: Internal Medicine | Admitting: Internal Medicine

## 2016-03-04 ENCOUNTER — Emergency Department: Payer: BLUE CROSS/BLUE SHIELD

## 2016-03-04 DIAGNOSIS — K449 Diaphragmatic hernia without obstruction or gangrene: Secondary | ICD-10-CM | POA: Insufficient documentation

## 2016-03-04 DIAGNOSIS — N132 Hydronephrosis with renal and ureteral calculous obstruction: Principal | ICD-10-CM | POA: Insufficient documentation

## 2016-03-04 DIAGNOSIS — N200 Calculus of kidney: Secondary | ICD-10-CM

## 2016-03-04 DIAGNOSIS — G4730 Sleep apnea, unspecified: Secondary | ICD-10-CM | POA: Insufficient documentation

## 2016-03-04 DIAGNOSIS — Z880 Allergy status to penicillin: Secondary | ICD-10-CM | POA: Diagnosis not present

## 2016-03-04 DIAGNOSIS — Z87442 Personal history of urinary calculi: Secondary | ICD-10-CM | POA: Insufficient documentation

## 2016-03-04 DIAGNOSIS — Z9049 Acquired absence of other specified parts of digestive tract: Secondary | ICD-10-CM | POA: Diagnosis not present

## 2016-03-04 DIAGNOSIS — Z9889 Other specified postprocedural states: Secondary | ICD-10-CM | POA: Diagnosis not present

## 2016-03-04 DIAGNOSIS — Z9884 Bariatric surgery status: Secondary | ICD-10-CM | POA: Diagnosis not present

## 2016-03-04 DIAGNOSIS — K219 Gastro-esophageal reflux disease without esophagitis: Secondary | ICD-10-CM | POA: Diagnosis not present

## 2016-03-04 HISTORY — DX: Calculus of kidney: N20.0

## 2016-03-04 LAB — URINALYSIS COMPLETE WITH MICROSCOPIC (ARMC ONLY)
BILIRUBIN URINE: NEGATIVE
Bacteria, UA: NONE SEEN
Glucose, UA: NEGATIVE mg/dL
LEUKOCYTES UA: NEGATIVE
NITRITE: NEGATIVE
PH: 7 (ref 5.0–8.0)
PROTEIN: NEGATIVE mg/dL
SPECIFIC GRAVITY, URINE: 1.005 (ref 1.005–1.030)
SQUAMOUS EPITHELIAL / LPF: NONE SEEN

## 2016-03-04 LAB — BASIC METABOLIC PANEL
ANION GAP: 12 (ref 5–15)
BUN: 16 mg/dL (ref 6–20)
CALCIUM: 9.8 mg/dL (ref 8.9–10.3)
CO2: 25 mmol/L (ref 22–32)
Chloride: 98 mmol/L — ABNORMAL LOW (ref 101–111)
Creatinine, Ser: 1.29 mg/dL — ABNORMAL HIGH (ref 0.61–1.24)
Glucose, Bld: 110 mg/dL — ABNORMAL HIGH (ref 65–99)
Potassium: 3.9 mmol/L (ref 3.5–5.1)
Sodium: 135 mmol/L (ref 135–145)

## 2016-03-04 LAB — CBC
HEMATOCRIT: 45 % (ref 40.0–52.0)
Hemoglobin: 15.4 g/dL (ref 13.0–18.0)
MCH: 27.6 pg (ref 26.0–34.0)
MCHC: 34.3 g/dL (ref 32.0–36.0)
MCV: 80.5 fL (ref 80.0–100.0)
PLATELETS: 318 10*3/uL (ref 150–440)
RBC: 5.58 MIL/uL (ref 4.40–5.90)
RDW: 13.1 % (ref 11.5–14.5)
WBC: 16.8 10*3/uL — AB (ref 3.8–10.6)

## 2016-03-04 SURGERY — CYSTOSCOPY, WITH STENT INSERTION
Anesthesia: Choice | Laterality: Bilateral

## 2016-03-04 MED ORDER — ACETAMINOPHEN 325 MG PO TABS: 650.0000 mg | ORAL_TABLET | Freq: Four times a day (QID) | ORAL | Status: DC | PRN

## 2016-03-04 MED ORDER — MORPHINE SULFATE (PF) 4 MG/ML IV SOLN
4.0000 mg | Freq: Once | INTRAVENOUS | Status: AC
  Administered 2016-03-04: 4 mg via INTRAVENOUS
  Filled 2016-03-04: qty 1

## 2016-03-04 MED ORDER — ACETAMINOPHEN 650 MG RE SUPP: 650.0000 mg | Freq: Four times a day (QID) | RECTAL | Status: DC | PRN

## 2016-03-04 MED ORDER — OXYCODONE HCL 5 MG PO TABS
5.0000 mg | ORAL_TABLET | ORAL | Status: DC | PRN
  Administered 2016-03-04 – 2016-03-05 (×2): 5 mg via ORAL
  Filled 2016-03-04 (×2): qty 1

## 2016-03-04 MED ORDER — ONDANSETRON HCL 4 MG/2ML IJ SOLN: 4.0000 mg | Freq: Four times a day (QID) | INTRAMUSCULAR | Status: DC | PRN

## 2016-03-04 MED ORDER — TAMSULOSIN HCL 0.4 MG PO CAPS
0.4000 mg | ORAL_CAPSULE | Freq: Every day | ORAL | Status: DC
  Administered 2016-03-04: 0.4 mg via ORAL
  Filled 2016-03-04: qty 1

## 2016-03-04 MED ORDER — SODIUM CHLORIDE 0.9 % IV SOLN: INTRAVENOUS | Status: DC

## 2016-03-04 MED ORDER — KETOROLAC TROMETHAMINE 30 MG/ML IJ SOLN
30.0000 mg | Freq: Four times a day (QID) | INTRAMUSCULAR | Status: DC | PRN
  Administered 2016-03-04: 30 mg via INTRAVENOUS
  Filled 2016-03-04: qty 1

## 2016-03-04 MED ORDER — TAMSULOSIN HCL 0.4 MG PO CAPS: 0.4000 mg | ORAL_CAPSULE | Freq: Every day | ORAL | Status: DC

## 2016-03-04 MED ORDER — ONDANSETRON 4 MG PO TBDP
4.0000 mg | ORAL_TABLET | Freq: Once | ORAL | Status: AC | PRN
Start: 2016-03-04 — End: 2016-03-04
  Administered 2016-03-04: 4 mg via ORAL

## 2016-03-04 MED ORDER — DOCUSATE SODIUM 100 MG PO CAPS
100.0000 mg | ORAL_CAPSULE | Freq: Two times a day (BID) | ORAL | Status: DC
  Administered 2016-03-04: 100 mg via ORAL
  Filled 2016-03-04: qty 1

## 2016-03-04 MED ORDER — ONDANSETRON HCL 4 MG PO TABS
4.0000 mg | ORAL_TABLET | Freq: Four times a day (QID) | ORAL | Status: DC | PRN
Start: 2016-03-04 — End: 2016-03-05

## 2016-03-04 MED ORDER — ONDANSETRON 4 MG PO TBDP
ORAL_TABLET | ORAL | Status: AC
  Filled 2016-03-04: qty 1

## 2016-03-04 MED ORDER — SODIUM CHLORIDE 0.9 % IV SOLN
INTRAVENOUS | Status: DC
  Administered 2016-03-04: 1000 mL via INTRAVENOUS
  Administered 2016-03-05: 07:00:00 via INTRAVENOUS

## 2016-03-04 MED ORDER — MORPHINE SULFATE (PF) 4 MG/ML IV SOLN
4.0000 mg | INTRAVENOUS | Status: DC | PRN
  Administered 2016-03-04: 4 mg via INTRAVENOUS
  Filled 2016-03-04: qty 1

## 2016-03-04 MED ORDER — PANTOPRAZOLE SODIUM 40 MG PO TBEC
40.0000 mg | DELAYED_RELEASE_TABLET | Freq: Every day | ORAL | Status: DC
  Administered 2016-03-05: 40 mg via ORAL
  Filled 2016-03-04: qty 1

## 2016-03-04 MED ORDER — ONDANSETRON HCL 4 MG/2ML IJ SOLN
4.0000 mg | Freq: Once | INTRAMUSCULAR | Status: AC
  Administered 2016-03-04: 4 mg via INTRAVENOUS
  Filled 2016-03-04: qty 2

## 2016-03-04 MED ORDER — SODIUM CHLORIDE 0.9 % IV BOLUS (SEPSIS)
2000.0000 mL | Freq: Once | INTRAVENOUS | Status: AC
  Administered 2016-03-04: 2000 mL via INTRAVENOUS

## 2016-03-04 MED ORDER — POLYETHYLENE GLYCOL 3350 17 G PO PACK
17.0000 g | PACK | Freq: Every day | ORAL | Status: DC
  Administered 2016-03-04: 17 g via ORAL

## 2016-03-04 MED ORDER — POLYETHYLENE GLYCOL 3350 17 G PO PACK
PACK | ORAL | Status: AC
  Administered 2016-03-04: 17 g via ORAL
  Filled 2016-03-04: qty 1

## 2016-03-04 SURGICAL SUPPLY — 22 items
BACTOSHIELD CHG 4% 4OZ (MISCELLANEOUS) ×1
CATH URETL 5X70 OPEN END (CATHETERS) ×2 IMPLANT
CONRAY 43 FOR UROLOGY 50M (MISCELLANEOUS) ×2 IMPLANT
GLOVE BIO SURGEON STRL SZ7 (GLOVE) ×4 IMPLANT
GLOVE BIO SURGEON STRL SZ7.5 (GLOVE) ×2 IMPLANT
GOWN STRL REUS W/ TWL LRG LVL3 (GOWN DISPOSABLE) ×1 IMPLANT
GOWN STRL REUS W/ TWL LRG LVL4 (GOWN DISPOSABLE) ×1 IMPLANT
GOWN STRL REUS W/TWL LRG LVL3 (GOWN DISPOSABLE) ×1
GOWN STRL REUS W/TWL LRG LVL4 (GOWN DISPOSABLE) ×1
GOWN STRL REUS W/TWL XL LVL3 (GOWN DISPOSABLE) ×2 IMPLANT
KIT RM TURNOVER CYSTO AR (KITS) ×2 IMPLANT
PACK CYSTO AR (MISCELLANEOUS) ×2 IMPLANT
SCRUB CHG 4% DYNA-HEX 4OZ (MISCELLANEOUS) ×1 IMPLANT
SENSORWIRE 0.038 NOT ANGLED (WIRE)
SET CYSTO W/LG BORE CLAMP LF (SET/KITS/TRAYS/PACK) ×2 IMPLANT
SOL .9 NS 3000ML IRR  AL (IV SOLUTION) ×1
SOL .9 NS 3000ML IRR UROMATIC (IV SOLUTION) ×1 IMPLANT
STENT URET 6FRX24 CONTOUR (STENTS) IMPLANT
STENT URET 6FRX26 CONTOUR (STENTS) IMPLANT
SURGILUBE 2OZ TUBE FLIPTOP (MISCELLANEOUS) ×2 IMPLANT
WATER STERILE IRR 1000ML POUR (IV SOLUTION) ×2 IMPLANT
WIRE SENSOR 0.038 NOT ANGLED (WIRE) IMPLANT

## 2016-03-04 NOTE — ED Provider Notes (Signed)
[facility] Emergency Department Provider Note  ____________________________________________  Time seen: Approximately 4:16 PM  I have reviewed the triage vital signs and the nursing notes.   HISTORY  Chief Complaint Flank Pain; Nausea; and Emesis   HPI [name] is a 45 y.o. male the history of gastric bypass and kidney stones and presents for evaluation of right flank pain. Patient was seen here on 7/21 and had a CT scan showing left-sided kidney stones and also 9 mm stones in his right kidney. Patient followed up with urology and it was recommended that he underwent a lithotripsy for the stones on the right side. He underwent lithotripsy yesterday. Patient reports no pain before the procedure and since going home has had severe pain on his right flank. Has had 6 episodes initially of nonbloody nonbilious emesis with the last 2 with some blood. Patient has been taking oxycodone 5 mg every 4 hours. He currently endorses 10 out of 10 pain, located in the left flank, radiated to his left groin, constant, sharp. Patient denies dysuria or fever. He endorses hematuria.  Past Medical History:  Diagnosis Date  . Acid reflux   . Hematuria   . Kidney stones   . Sleep apnea   . Urolithiasis     Patient Active Problem List   Diagnosis Date Noted  . Nephrolithiasis 03/04/2016    Past Surgical History:  Procedure Laterality Date  . EXTRACORPOREAL SHOCK WAVE LITHOTRIPSY Right 03/03/2016   Procedure: EXTRACORPOREAL SHOCK WAVE LITHOTRIPSY (ESWL);  Surgeon: Vanna Scotland, MD;  Location: [facility] ORS;  Service: Urology;  Laterality: Right;  . KNEE SURGERY    . LAPAROSCOPIC GASTRIC SLEEVE RESECTION      Prior to Admission medications   Medication Sig Start Date End Date Taking? Authorizing Provider  ondansetron (ZOFRAN ODT) 4 MG disintegrating tablet Take 1 tablet (4 mg total) by mouth every 8 (eight) hours as needed for nausea or vomiting. 02/19/16  Yes Darci Current, MD  oxyCODONE-acetaminophen (ROXICET) 5-325 MG tablet Take 1 tablet by mouth every 4 (four) hours as needed for severe pain. 02/19/16  Yes Darci Current, MD  tamsulosin [facility]) 0.4 MG CAPS capsule Take 1 capsule (0.4 mg total) by mouth daily after breakfast. 02/19/16  Yes Darci Current, MD  omeprazole (PRILOSEC) 20 MG capsule Take 20 mg by mouth at bedtime.  12/30/15 12/29/16  Historical Provider, MD    Allergies Penicillins  Family History  Problem Relation Age of Onset  . Prostate cancer Father   . Kidney disease Neg Hx   . Kidney cancer Neg Hx     Social History Social History  Substance Use Topics  . Smoking status: Never Smoker  . Smokeless tobacco: Never Used  . Alcohol use 0.0 oz/week     Comment: occ.     Review of Systems  Constitutional: Negative for fever. Eyes: Negative for visual changes. ENT: Negative for sore throat. Cardiovascular: Negative for chest pain. Respiratory: Negative for shortness of breath. Gastrointestinal: Negative for abdominal pain, diarrhea. + vomiting Genitourinary: Negative for dysuria. + Right flank pain and hematuria Musculoskeletal: Negative for back pain. Skin: Negative for rash. Neurological: Negative for headaches, weakness or numbness.  ____________________________________________   PHYSICAL EXAM:  VITAL SIGNS: ED Triage Vitals  Enc Vitals Group     BP 03/04/16 1437 (!) 152/108     Pulse Rate 03/04/16 1437 71     Resp 03/04/16 1437 18     Temp 03/04/16 1437 98.7  F (37.1 C)     Temp Source 03/04/16 1437 Oral     SpO2 03/04/16 1437 100 %     Weight 03/04/16 1437 190 lb (86.2 kg)     Height 03/04/16 1437 5\' 6"  (1.676 m)     Head Circumference --      Peak Flow --      Pain Score 03/04/16 1442 8     Pain Loc --      Pain Edu? --      Excl. in GC? --     Constitutional: Alert and oriented. In distress due to pain HEENT:      Head: Normocephalic and atraumatic.         Eyes: Conjunctivae are normal.  Sclera is non-icteric. EOMI. PERRL      Mouth/Throat: Mucous membranes are moist.       Neck: Supple with no signs of meningismus. Cardiovascular: Regular rate and rhythm. No murmurs, gallops, or rubs. 2+ symmetrical distal pulses are present in all extremities. No JVD. Respiratory: Normal respiratory effort. Lungs are clear to auscultation bilaterally. No wheezes, crackles, or rhonchi.  Gastrointestinal: Soft, ttp on the RLQ, and non distended with positive bowel sounds. No rebound or guarding. Genitourinary: R CVA tenderness. Musculoskeletal: Nontender with normal range of motion in all extremities. No edema, cyanosis, or erythema of extremities. Neurologic: Normal speech and language. Face is symmetric. Moving all extremities. No gross focal neurologic deficits are appreciated. Skin: Skin is warm, dry and intact. No rash noted. Psychiatric: Mood and affect are normal. Speech and behavior are normal.  ____________________________________________   LABS (all labs ordered are listed, but only abnormal results are displayed)  Labs Reviewed  URINALYSIS COMPLETEWITH MICROSCOPIC ([facility] ONLY) - Abnormal; Notable for the following:       Result Value   Color, Urine STRAW (*)    APPearance CLEAR (*)    Ketones, ur 1+ (*)    Hgb urine dipstick 2+ (*)    All other components within normal limits  BASIC METABOLIC PANEL - Abnormal; Notable for the following:    Chloride 98 (*)    Glucose, Bld 110 (*)    Creatinine, Ser 1.29 (*)    All other components within normal limits  CBC - Abnormal; Notable for the following:    WBC 16.8 (*)    All other components within normal limits  URINE CULTURE  BASIC METABOLIC PANEL   ____________________________________________  EKG  none  ____________________________________________  RADIOLOGY  CT renal: 1. There are 3 stones in the right ureter and 1 stone in the distal left ureter with right greater than left hydronephrosis and right-sided  perinephric stranding. This accounts for the patient's symptoms. Multiple other stones are seen in the right kidney as described above.  ____________________________________________   PROCEDURES  Procedure(s) performed: None Procedures Critical Care performed:  None ____________________________________________   INITIAL IMPRESSION / ASSESSMENT AND PLAN / ED COURSE  45 y.o. male the history of gastric bypass and kidney stones and presents for evaluation of severe right flank pain and hematuria. Patient is postop day 1 from lithotripsy. We'll treat his symptoms with IV fluids, IV Zofran, IV morphine. We'll repeat a CT renal protocol.  Clinical Course  Comment By Time  Spoke with Dr. Sherryl Barters nephrology who recommended admitting patient for observation and make him nothing by mouth at midnight for possible repeat procedure in the morning. He is concerned the patient has stones in his bilateral ureters and is concerned that he might develop  kidney dysfunction. He would like to start patient on Flomax, maintenance fluids, nothing by mouth at midnight, recheck creatinine in the morning. We'll discuss with the hospitalist for admission. Nita Sickle, MD 08/04 1921    Pertinent labs & imaging results that were available during my care of the patient were reviewed by me and considered in my medical decision making (see chart for details).    ____________________________________________   FINAL CLINICAL IMPRESSION(S) / ED DIAGNOSES  Final diagnoses:  Kidney stone      NEW MEDICATIONS STARTED DURING THIS VISIT:  Current Discharge Medication List       Note:  This document was prepared using Dragon voice recognition software and may include unintentional dictation errors.    Nita Sickle, MD 03/04/16 2312

## 2016-03-04 NOTE — Telephone Encounter (Signed)
Patient called stating he is not doing well after litho. Patient states he is not able to keep his pain under control with the pain medication given he is a 8/10 with right flank pain. He is also experiencing nausea and vomiting since yesterday he has only had some crackers at 3pm yesterday and some water.  Patient states he has passed some blood and small sand like fragments in his urine but no other urinary symptoms. Patient states his temp is normal but when he vomited an hour ago it was black. Per Dr. Apolinar Junes patient should go to the ER for further evaluation. Patient verbalized understanding

## 2016-03-04 NOTE — ED Notes (Signed)
Pt had vomiting episode in [place].

## 2016-03-04 NOTE — H&P (Signed)
[facility] [place] [facility]   PATIENT NAME: [name]    MR#:  [id number]  DATE OF BIRTH:  Jun 12, 1971   DATE OF ADMISSION:  03/04/2016  PRIMARY CARE PHYSICIAN: Dione Housekeeper, MD   REQUESTING/REFERRING PHYSICIAN: Don Perking  CHIEF COMPLAINT:   Chief Complaint  Patient presents with  . Flank Pain  . Nausea  . Emesis    HISTORY OF PRESENT ILLNESS:  [name]  is a 45 y.o. male with a known history of Nephrolithiasis presenting with bilateral flank pain right worse than left. Patient underwent lithotripsy yesterday however now having 1 day duration of worsening flank pain sharp in quality radiating to groin intensity 10/10 no worsening or relieving factors associated nauseousness.  PAST MEDICAL HISTORY:   Past Medical History:  Diagnosis Date  . Acid reflux   . Hematuria   . Kidney stones   . Sleep apnea   . Urolithiasis     PAST SURGICAL HISTORY:   Past Surgical History:  Procedure Laterality Date  . EXTRACORPOREAL SHOCK WAVE LITHOTRIPSY Right 03/03/2016   Procedure: EXTRACORPOREAL SHOCK WAVE LITHOTRIPSY (ESWL);  Surgeon: Vanna Scotland, MD;  Location: [facility] ORS;  Service: Urology;  Laterality: Right;  . KNEE SURGERY    . LAPAROSCOPIC GASTRIC SLEEVE RESECTION      SOCIAL HISTORY:   Social History  Substance Use Topics  . Smoking status: Never Smoker  . Smokeless tobacco: Never Used  . Alcohol use 0.0 oz/week     Comment: occ.     FAMILY HISTORY:   Family History  Problem Relation Age of Onset  . Prostate cancer Father   . Kidney disease Neg Hx   . Kidney cancer Neg Hx     DRUG ALLERGIES:   Allergies  Allergen Reactions  . Penicillins Itching and Rash    Has patient had a PCN reaction causing immediate rash, facial/tongue/throat swelling, SOB or lightheadedness with hypotension: No Has patient had a PCN reaction causing severe rash involving mucus membranes or skin necrosis: No Has patient had a PCN reaction that  required hospitalization No Has patient had a PCN reaction occurring within the last 10 years: No If all of the above answers are "NO", then may proceed with Cephalosporin use.    REVIEW OF SYSTEMS:  REVIEW OF SYSTEMS:  CONSTITUTIONAL: Denies fevers, chills, fatigue, weakness.  EYES: Denies blurred vision, double vision, or eye pain.  EARS, NOSE, THROAT: Denies tinnitus, ear pain, hearing loss.  RESPIRATORY: denies cough, shortness of breath, wheezing  CARDIOVASCULAR: Denies chest pain, palpitations, edema.  GASTROINTESTINAL: Denies nausea, vomiting, diarrhea, abdominal pain.  GENITOURINARY: Denies dysuria, hematuria.  ENDOCRINE: Denies nocturia or thyroid problems. HEMATOLOGIC AND LYMPHATIC: Denies easy bruising or bleeding.  SKIN: Denies rash or lesions.  MUSCULOSKELETAL: Denies pain in neck, back, shoulder, knees, hips, or further arthritic symptoms.  NEUROLOGIC: Denies paralysis, paresthesias.  PSYCHIATRIC: Denies anxiety or depressive symptoms. Otherwise full review of systems performed by me is negative.   MEDICATIONS AT HOME:   Prior to Admission medications   Medication Sig Start Date End Date Taking? Authorizing Provider  ondansetron (ZOFRAN ODT) 4 MG disintegrating tablet Take 1 tablet (4 mg total) by mouth every 8 (eight) hours as needed for nausea or vomiting. 02/19/16  Yes Darci Current, MD  oxyCODONE-acetaminophen (ROXICET) 5-325 MG tablet Take 1 tablet by mouth every 4 (four) hours as needed for severe pain. 02/19/16  Yes Darci Current, MD  tamsulosin (FLOMAX) 0.4 MG CAPS capsule Take 1  capsule (0.4 mg total) by mouth daily after breakfast. 02/19/16  Yes Darci Current, MD  omeprazole (PRILOSEC) 20 MG capsule Take 20 mg by mouth at bedtime.  12/30/15 12/29/16  Historical Provider, MD      VITAL SIGNS:  Blood pressure 124/80, pulse 73, temperature 98.7 F (37.1 C), temperature source Oral, resp. rate 16, height  (1.676 m), weight 190 lb (86.2 kg), SpO2 95  %.  PHYSICAL EXAMINATION:  VITAL SIGNS: Vitals:   03/04/16 1730 03/04/16 1930  BP: (!) 142/97 124/80  Pulse: 65 73  Resp: (!) 26 16  Temp:     GENERAL:45 y.o.male currently in no acute distress.  HEAD: Normocephalic, atraumatic.  EYES: Pupils equal, round, reactive to light. Extraocular muscles intact. No scleral icterus.  MOUTH: Moist mucosal membrane. Dentition intact. No abscess noted.  EAR, NOSE, THROAT: Clear without exudates. No external lesions.  NECK: Supple. No thyromegaly. No nodules. No JVD.  PULMONARY: Clear to ascultation, without wheeze rails or rhonci. No use of accessory muscles, Good respiratory effort. good air entry bilaterally CHEST: Nontender to palpation.  CARDIOVASCULAR: S1 and S2. Regular rate and rhythm. No murmurs, rubs, or gallops. No edema. Pedal pulses 2+ bilaterally.  GASTROINTESTINAL: Soft, nontender, nondistended. No masses. Positive bowel sounds. No hepatosplenomegaly. Positive CVA tenderness MUSCULOSKELETAL: No swelling, clubbing, or edema. Range of motion full in all extremities.  NEUROLOGIC: Cranial nerves II through XII are intact. No gross focal neurological deficits. Sensation intact. Reflexes intact.  SKIN: No ulceration, lesions, rashes, or cyanosis. Skin warm and dry. Turgor intact.  PSYCHIATRIC: Mood, affect within normal limits. The patient is awake, alert and oriented x 3. Insight, judgment intact.    LABORATORY PANEL:   CBC  Recent Labs Lab 03/04/16 1443  WBC 16.8*  HGB 15.4  HCT 45.0  PLT 318   ------------------------------------------------------------------------------------------------------------------  Chemistries   Recent Labs Lab 03/04/16 1443  NA 135  K 3.9  CL 98*  CO2 25  GLUCOSE 110*  BUN 16  CREATININE 1.29*  CALCIUM 9.8   ------------------------------------------------------------------------------------------------------------------  Cardiac Enzymes No results for input(s): TROPONINI in the last  168 hours. ------------------------------------------------------------------------------------------------------------------  RADIOLOGY:  Dg Abd 1 View  Result Date: 03/03/2016 CLINICAL DATA:  Preoperative lithotripsy EXAM: ABDOMEN - 1 VIEW COMPARISON:  CT abdomen and pelvis February 19, 2016 FINDINGS: There are several clustered calculi in the midportion of the right kidney. These calculi range in size from as small as 2 mm to as large as 10 x 5 mm. There is a probable small phlebolith in the inferior left pelvis. There is moderate stool in the colon. There is no bowel dilatation or air-fluid level suggesting obstruction. No free air. There are surgical clips in the right upper quadrant in the gallbladder fossa region. There are also surgical clips in the left upper abdomen consistent with gastric bypass surgery. IMPRESSION: Multiple right renal calculi in a cluster as noted above. Probable small phlebolith inferior left pelvis. Areas of postoperative change. Bowel gas pattern unremarkable without bowel obstruction or free air evident. Electronically Signed   By: Bretta Bang III M.D.   On: 03/03/2016 08:19   Ct Renal Stone Study  Result Date: 03/04/2016 CLINICAL DATA:  Right-sided flank pain. Lithotripsy done yesterday. Hematuria. EXAM: CT ABDOMEN AND PELVIS WITHOUT CONTRAST TECHNIQUE: Multidetector CT imaging of the abdomen and pelvis was performed following the standard protocol without IV contrast. COMPARISON:  February 19, 2016 CT scan and March 03, 2016 KUB FINDINGS: There is mild thickening of the distal  esophagus. This was noted to represent a small hiatal hernia on the comparison CT scan. The lung bases are otherwise normal. No free air or free fluid. Multiple clustered stones seen in the lower right kidney. Several tiny stones are seen in the upper pole calices on the right. Several small stones are seen in the right renal pelvis. Two stones are seen in the proximal right ureter best seen on coronal  image 37 with the largest measuring 4 mm in transverse dimension. The more inferior of the 2 measures 5.5 mm in cranial caudal dimension. Another stone is seen in the mid right ureter near the pelvic brim measuring nearly 4 mm. There is also a stone in the distal left ureter on axial image 79 measuring 3.5 mm. No other left ureteral stones are identified. There is bilateral hydronephrosis, right greater than left with no significant perinephric stranding on the left but mild perinephric stranding on the right. An exophytic mass off the left kidney has been present since October 2014. It demonstrates an attenuation of 19 Hounsfield units today, likely a hyperdense cyst. Another adjacent cyst is seen in the left kidney. The patient is status post cholecystectomy. The liver, spleen, adrenal glands, and pancreas are normal. Non aneurysmal aorta with minimal atherosclerotic change. No adenopathy. Previous gastric surgery. The small bowel is normal. The colon is normal. No evidence of appendicitis. The pelvis demonstrates a distal left ureteral stone. No adenopathy or mass. No change in the bones. IMPRESSION: 1. There are 3 stones in the right ureter and 1 stone in the distal left ureter with right greater than left hydronephrosis and right-sided perinephric stranding. This accounts for the patient's symptoms. Multiple other stones are seen in the right kidney as described above. Electronically Signed   By: Gerome Sam III M.D   On: 03/04/2016 18:29    EKG:   Orders placed or performed in visit on 05/31/08  . EKG 12-Lead    IMPRESSION AND PLAN:   45 year old Caucasian gentleman history of nephrolithiasis who is presenting with flank pain  1. Nephrolithiasis: Intractable pain: Patient recently underwent lithotripsy stones removed but still present, no signs of obstruction based on imaging and blood work, however given pain provide supportive care urology consult planning for likely cystoscopy tomorrow.   Start Flomax 2. GERD without esophagitis PPI therapy    All the records are reviewed and case discussed with ED provider. Management plans discussed with the patient, family and they are in agreement.  CODE STATUS: Full  TOTAL TIME TAKING CARE OF THIS PATIENT: 33 minutes.    Hower,  Mardi Mainland.D on 03/04/2016 at 8:06 PM  Between 7am to 6pm - Pager - [phone]  After 6pm: House Pager: - [phone]  [facility]  Office  [phone]  CC: Primary care physician; Dione Housekeeper, MD

## 2016-03-04 NOTE — Consult Note (Signed)
Full consult to follow in AM   Patient with right lithotripsy yesterday. Now with stones in right ureter as well as small stone in left ureter. Presented with right flank pain/n/v.  Cr: 1.29. Pain now controlled. No signs of sepsis.  Bilateral ureteral stones concerning though Cr is relatively normal.   Recommend: -admission with IV fluids -repeat Cr around 6 AM saturday -strain all urine -flomax 0.4 mg daily. First dose now. -NPO after midnight -will hold OR spot for cystoscopy, bilateral ureteral stents for late Saturday morning pending Cr results and discussion with patient in the Morning. No emergent intervention tonight given patient's Cr and lack of signs of infection 

## 2016-03-04 NOTE — ED Triage Notes (Signed)
Pt arrives to ER via POV c/o right sided flank pain, nausea and vomiting since lithotripsy that was done yesterday. Pt reports hematuria and cointinued right flank pain. Prescribed zofran for nausea, last taken at 200PM and it is not helping. Hx of kidney stones.

## 2016-03-05 ENCOUNTER — Encounter: Payer: Self-pay | Admitting: Anesthesiology

## 2016-03-05 ENCOUNTER — Encounter
Admission: EM | Disposition: A | Discharge status: Home / Self Care | Payer: Self-pay | Source: Home / Self Care | Attending: Emergency Medicine

## 2016-03-05 ENCOUNTER — Observation Stay: Payer: BLUE CROSS/BLUE SHIELD | Admitting: Certified Registered Nurse Anesthetist

## 2016-03-05 DIAGNOSIS — N132 Hydronephrosis with renal and ureteral calculous obstruction: Secondary | ICD-10-CM | POA: Diagnosis not present

## 2016-03-05 HISTORY — PX: CYSTOSCOPY WITH STENT PLACEMENT: SHX5790

## 2016-03-05 HISTORY — PX: CYSTOSCOPY W/ RETROGRADES: SHX1426

## 2016-03-05 LAB — BASIC METABOLIC PANEL
Anion gap: 6 (ref 5–15)
BUN: 18 mg/dL (ref 6–20)
CALCIUM: 8.7 mg/dL — AB (ref 8.9–10.3)
CHLORIDE: 106 mmol/L (ref 101–111)
CO2: 26 mmol/L (ref 22–32)
CREATININE: 1.2 mg/dL (ref 0.61–1.24)
GFR calc Af Amer: >60 mL/min (ref >60)
GFR calc non Af Amer: >60 mL/min (ref >60)
Glucose, Bld: 103 mg/dL — ABNORMAL HIGH (ref 65–99)
Potassium: 3.9 mmol/L (ref 3.5–5.1)
Sodium: 138 mmol/L (ref 135–145)

## 2016-03-05 SURGERY — CYSTOSCOPY, WITH RETROGRADE PYELOGRAM
Anesthesia: General | Laterality: Bilateral

## 2016-03-05 MED ORDER — OXYCODONE-ACETAMINOPHEN 5-325 MG PO TABS: 1.0000 | ORAL_TABLET | ORAL | 0 refills | Status: DC | PRN

## 2016-03-05 MED ORDER — CIPROFLOXACIN HCL 500 MG PO TABS: 500.0000 mg | ORAL_TABLET | Freq: Two times a day (BID) | ORAL | 0 refills | Status: DC

## 2016-03-05 MED ORDER — FENTANYL CITRATE (PF) 100 MCG/2ML IJ SOLN
INTRAMUSCULAR | Status: DC | PRN
  Administered 2016-03-05 (×2): 50 ug via INTRAVENOUS

## 2016-03-05 MED ORDER — FENTANYL CITRATE (PF) 100 MCG/2ML IJ SOLN: 25.0000 ug | INTRAMUSCULAR | Status: DC | PRN

## 2016-03-05 MED ORDER — MIDAZOLAM HCL 2 MG/2ML IJ SOLN
INTRAMUSCULAR | Status: DC | PRN
  Administered 2016-03-05: 2 mg via INTRAVENOUS

## 2016-03-05 MED ORDER — TAMSULOSIN HCL 0.4 MG PO CAPS: 0.4000 mg | ORAL_CAPSULE | Freq: Every day | ORAL | 0 refills | Status: DC

## 2016-03-05 MED ORDER — PHENYLEPHRINE HCL 10 MG/ML IJ SOLN
INTRAMUSCULAR | Status: DC | PRN
  Administered 2016-03-05: 100 ug via INTRAVENOUS

## 2016-03-05 MED ORDER — DEXAMETHASONE SODIUM PHOSPHATE 10 MG/ML IJ SOLN
INTRAMUSCULAR | Status: DC | PRN
  Administered 2016-03-05: 10 mg via INTRAVENOUS

## 2016-03-05 MED ORDER — CIPROFLOXACIN IN D5W 400 MG/200ML IV SOLN
400.0000 mg | INTRAVENOUS | Status: AC
  Administered 2016-03-05: 400 mg via INTRAVENOUS
  Filled 2016-03-05: qty 200

## 2016-03-05 MED ORDER — LIDOCAINE HCL (CARDIAC) 20 MG/ML IV SOLN
INTRAVENOUS | Status: DC | PRN
  Administered 2016-03-05: 100 mg via INTRAVENOUS

## 2016-03-05 MED ORDER — PROMETHAZINE HCL 25 MG/ML IJ SOLN: 6.2500 mg | INTRAMUSCULAR | Status: DC | PRN

## 2016-03-05 MED ORDER — PROPOFOL 10 MG/ML IV BOLUS
INTRAVENOUS | Status: DC | PRN
  Administered 2016-03-05: 160 mg via INTRAVENOUS

## 2016-03-05 SURGICAL SUPPLY — 22 items
BACTOSHIELD CHG 4% 4OZ (MISCELLANEOUS) ×1
CATH URETL 5X70 OPEN END (CATHETERS) ×2 IMPLANT
CONRAY 43 FOR UROLOGY 50M (MISCELLANEOUS) ×2 IMPLANT
GLOVE BIO SURGEON STRL SZ7 (GLOVE) ×4 IMPLANT
GLOVE BIO SURGEON STRL SZ7.5 (GLOVE) ×2 IMPLANT
GOWN STRL REUS W/ TWL LRG LVL3 (GOWN DISPOSABLE) ×1 IMPLANT
GOWN STRL REUS W/ TWL LRG LVL4 (GOWN DISPOSABLE) ×1 IMPLANT
GOWN STRL REUS W/TWL LRG LVL3 (GOWN DISPOSABLE) ×1
GOWN STRL REUS W/TWL LRG LVL4 (GOWN DISPOSABLE) ×1
GOWN STRL REUS W/TWL XL LVL3 (GOWN DISPOSABLE) ×2 IMPLANT
KIT RM TURNOVER CYSTO AR (KITS) ×2 IMPLANT
PACK CYSTO AR (MISCELLANEOUS) ×2 IMPLANT
SCRUB CHG 4% DYNA-HEX 4OZ (MISCELLANEOUS) ×1 IMPLANT
SENSORWIRE 0.038 NOT ANGLED (WIRE) ×2
SET CYSTO W/LG BORE CLAMP LF (SET/KITS/TRAYS/PACK) ×2 IMPLANT
SOL .9 NS 3000ML IRR  AL (IV SOLUTION) ×1
SOL .9 NS 3000ML IRR UROMATIC (IV SOLUTION) ×1 IMPLANT
STENT URET 6FRX24 CONTOUR (STENTS) IMPLANT
STENT URET 6FRX26 CONTOUR (STENTS) ×4 IMPLANT
SURGILUBE 2OZ TUBE FLIPTOP (MISCELLANEOUS) ×2 IMPLANT
WATER STERILE IRR 1000ML POUR (IV SOLUTION) ×2 IMPLANT
WIRE SENSOR 0.038 NOT ANGLED (WIRE) ×1 IMPLANT

## 2016-03-05 NOTE — Progress Notes (Signed)
Patient discharge teaching given, including activity, diet, follow-up appoints, and medications. Patient verbalized understanding of all discharge instructions. IV access was d/c'd. Vitals are stable. Skin is intact except as charted in most recent assessments. Pt to be escorted out by family, pt refused wheelchair.  [name]

## 2016-03-05 NOTE — Anesthesia Preprocedure Evaluation (Signed)
Anesthesia Evaluation  Patient identified by MRN, date of birth, ID band Patient awake    Reviewed: Allergy & Precautions, H&P , NPO status , Patient's Chart, lab work & pertinent test results, reviewed documented beta blocker date and time   History of Anesthesia Complications Negative for: history of anesthetic complications  Airway Mallampati: IV  TM Distance: >3 FB Neck ROM: full    Dental no notable dental hx. (+) Caps, Poor Dentition   Pulmonary neg shortness of breath, sleep apnea (history, but none after weight loss) , neg COPD, neg recent URI,    Pulmonary exam normal breath sounds clear to auscultation       Cardiovascular Exercise Tolerance: Good negative cardio ROS Normal cardiovascular exam Rhythm:regular Rate:Normal     Neuro/Psych negative neurological ROS  negative psych ROS   GI/Hepatic Neg liver ROS, GERD  Medicated,  Endo/Other  negative endocrine ROS  Renal/GU Renal disease (kidney stones)  negative genitourinary   Musculoskeletal   Abdominal   Peds  Hematology negative hematology ROS (+)   Anesthesia Other Findings Past Medical History: No date: Acid reflux No date: Hematuria No date: Kidney stones No date: Sleep apnea No date: Urolithiasis   Reproductive/Obstetrics negative OB ROS                             Anesthesia Physical Anesthesia Plan  ASA: II  Anesthesia Plan: General   Post-op Pain Management:    Induction:   Airway Management Planned:   Additional Equipment:   Intra-op Plan:   Post-operative Plan:   Informed Consent: I have reviewed the patients History and Physical, chart, labs and discussed the procedure including the risks, benefits and alternatives for the proposed anesthesia with the patient or authorized representative who has indicated his/her understanding and acceptance.   Dental Advisory Given  Plan Discussed with:  Anesthesiologist, CRNA and Surgeon  Anesthesia Plan Comments:         Anesthesia Quick Evaluation

## 2016-03-05 NOTE — Discharge Summary (Signed)
[facility] [place] [facility]   PATIENT NAME: [name]    MR#:  [id number]  DATE OF BIRTH:  1971/06/23  DATE OF ADMISSION:  03/04/2016 ADMITTING PHYSICIAN: Wyatt Haste, MD  DATE OF DISCHARGE: 03/05/2016  PRIMARY CARE PHYSICIAN: Dione Housekeeper, MD    ADMISSION DIAGNOSIS:  Kidney stone [N20.0]  DISCHARGE DIAGNOSIS:  Active Problems:   Nephrolithiasis   SECONDARY DIAGNOSIS:   Past Medical History:  Diagnosis Date  . Acid reflux   . Hematuria   . Kidney stones   . Sleep apnea   . Urolithiasis     HOSPITAL COURSE:  45 year old male with history of kidney stones who presents after lithotripsy with bilateral ureteral stones.  1. Bilateral ureteral stones: Patient underwent for cystoscopy and stent placement. Continue pain medications and Flomax. Follow up UROLOGY in 1-2 weeks.  Continue hydration at discharge.  2. GERD: Continue PPI   DISCHARGE CONDITIONS AND DIET:    stable Regular diet with fluids  CONSULTS OBTAINED:  Treatment Team:  Wyatt Haste, MD Hildred Laser, MD  DRUG ALLERGIES:   Allergies  Allergen Reactions  . Penicillins Itching and Rash    Has patient had a PCN reaction causing immediate rash, facial/tongue/throat swelling, SOB or lightheadedness with hypotension: No Has patient had a PCN reaction causing severe rash involving mucus membranes or skin necrosis: No Has patient had a PCN reaction that required hospitalization No Has patient had a PCN reaction occurring within the last 10 years: No If all of the above answers are "NO", then may proceed with Cephalosporin use.    DISCHARGE MEDICATIONS:   Current Discharge Medication List    START taking these medications   Details  ciprofloxacin (CIPRO) 500 MG tablet Take 1 tablet (500 mg total) by mouth 2 (two) times daily. Qty: 4 tablet, Refills: 0      CONTINUE these medications which have CHANGED   Details  !! oxyCODONE-acetaminophen (ROXICET)  5-325 MG tablet Take 1 tablet by mouth every 4 (four) hours as needed for severe pain. Qty: 20 tablet, Refills: 0    !! oxyCODONE-acetaminophen (ROXICET) 5-325 MG tablet Take 1-2 tablets by mouth every 4 (four) hours as needed for severe pain. Qty: 30 tablet, Refills: 0    tamsulosin (FLOMAX) 0.4 MG CAPS capsule Take 1 capsule (0.4 mg total) by mouth daily after breakfast. Qty: 14 capsule, Refills: 0     !! - Potential duplicate medications found. Please discuss with provider.    CONTINUE these medications which have NOT CHANGED   Details  ondansetron (ZOFRAN ODT) 4 MG disintegrating tablet Take 1 tablet (4 mg total) by mouth every 8 (eight) hours as needed for nausea or vomiting. Qty: 20 tablet, Refills: 0    omeprazole (PRILOSEC) 20 MG capsule Take 20 mg by mouth at bedtime.               Today   CHIEF COMPLAINT:  Pain controlled with pain medications   VITAL SIGNS:  Blood pressure 105/71, pulse 68, temperature 98.1 F (36.7 C), temperature source Tympanic, resp. rate 13, height 5\' 6"  (1.676 m), weight 85.7 kg (188 lb 14.4 oz), SpO2 100 %.   REVIEW OF SYSTEMS:  Review of Systems  Constitutional: Negative.  Negative for chills, fever and malaise/fatigue.  HENT: Negative.  Negative for ear discharge, ear pain, hearing loss, nosebleeds and sore throat.   Eyes: Negative.  Negative for blurred vision and pain.  Respiratory: Negative.  Negative for cough,  hemoptysis, shortness of breath and wheezing.   Cardiovascular: Negative.  Negative for chest pain, palpitations and leg swelling.  Gastrointestinal: Negative.  Negative for abdominal pain, blood in stool, diarrhea, nausea and vomiting.  Genitourinary: Negative.  Negative for dysuria.  Musculoskeletal: Negative.  Negative for back pain.  Skin: Negative.   Neurological: Negative for dizziness, tremors, speech change, focal weakness, seizures and headaches.  Endo/Heme/Allergies: Negative.  Does not bruise/bleed easily.   Psychiatric/Behavioral: Negative.  Negative for depression, hallucinations and suicidal ideas.     PHYSICAL EXAMINATION:  GENERAL:  45 y.o.-year-old patient lying in the bed with no acute distress.  NECK:  Supple, no jugular venous distention. No thyroid enlargement, no tenderness.  LUNGS: Normal breath sounds bilaterally, no wheezing, rales,rhonchi  No use of accessory muscles of respiration.  CARDIOVASCULAR: S1, S2 normal. No murmurs, rubs, or gallops.  ABDOMEN: Soft, non-tender, non-distended. Bowel sounds present. No organomegaly or mass.  EXTREMITIES: No pedal edema, cyanosis, or clubbing.  PSYCHIATRIC: The patient is alert and oriented x 3.  SKIN: No obvious rash, lesion, or ulcer.   DATA REVIEW:   CBC  Recent Labs Lab 03/04/16 1443  WBC 16.8*  HGB 15.4  HCT 45.0  PLT 318    Chemistries   Recent Labs Lab 03/05/16 0457  NA 138  K 3.9  CL 106  CO2 26  GLUCOSE 103*  BUN 18  CREATININE 1.20  CALCIUM 8.7*    Cardiac Enzymes No results for input(s): TROPONINI in the last 168 hours.  Microbiology Results  @  RADIOLOGY:  Ct Renal Stone Study  Result Date: 03/04/2016 CLINICAL DATA:  Right-sided flank pain. Lithotripsy done yesterday. Hematuria. EXAM: CT ABDOMEN AND PELVIS WITHOUT CONTRAST TECHNIQUE: Multidetector CT imaging of the abdomen and pelvis was performed following the standard protocol without IV contrast. COMPARISON:  February 19, 2016 CT scan and March 03, 2016 KUB FINDINGS: There is mild thickening of the distal esophagus. This was noted to represent a small hiatal hernia on the comparison CT scan. The lung bases are otherwise normal. No free air or free fluid. Multiple clustered stones seen in the lower right kidney. Several tiny stones are seen in the upper pole calices on the right. Several small stones are seen in the right renal pelvis. Two stones are seen in the proximal right ureter best seen on coronal image 37 with the largest measuring 4  mm in transverse dimension. The more inferior of the 2 measures 5.5 mm in cranial caudal dimension. Another stone is seen in the mid right ureter near the pelvic brim measuring nearly 4 mm. There is also a stone in the distal left ureter on axial image 79 measuring 3.5 mm. No other left ureteral stones are identified. There is bilateral hydronephrosis, right greater than left with no significant perinephric stranding on the left but mild perinephric stranding on the right. An exophytic mass off the left kidney has been present since October 2014. It demonstrates an attenuation of 19 Hounsfield units today, likely a hyperdense cyst. Another adjacent cyst is seen in the left kidney. The patient is status post cholecystectomy. The liver, spleen, adrenal glands, and pancreas are normal. Non aneurysmal aorta with minimal atherosclerotic change. No adenopathy. Previous gastric surgery. The small bowel is normal. The colon is normal. No evidence of appendicitis. The pelvis demonstrates a distal left ureteral stone. No adenopathy or mass. No change in the bones. IMPRESSION: 1. There are 3 stones in the right ureter and 1 stone in the distal left  ureter with right greater than left hydronephrosis and right-sided perinephric stranding. This accounts for the patient's symptoms. Multiple other stones are seen in the right kidney as described above. Electronically Signed   By: Gerome Sam III M.D   On: 03/04/2016 18:29      Management plans discussed with the patient and he is in agreement. Stable for discharge home  Patient should follow up with urology  CODE STATUS:     Code Status Orders        Start     Ordered   03/04/16 1941  Full code  Continuous     03/04/16 1941    Code Status History    Date Active Date Inactive Code Status Order ID Comments User Context   This patient has a current code status but no historical code status.      TOTAL TIME TAKING CARE OF THIS PATIENT: 36 minutes.     Note: This dictation was prepared with Dragon dictation along with smaller phrase technology. Any transcriptional errors that result from this process are unintentional.  Tymeka Privette M.D on 03/05/2016 at 11:24 AM  Between 7am to 6pm - Pager - [phone] After 6pm go to [email or web address] - Social research officer, government  [facility]  Office  [phone]  CC: Primary care physician; Dione Housekeeper, MD

## 2016-03-05 NOTE — Anesthesia Postprocedure Evaluation (Signed)
Anesthesia Post Note  Patient: [name]  Procedure(s) Performed: Procedure(s) (LRB): CYSTOSCOPY WITH RETROGRADE PYELOGRAM (Bilateral) CYSTOSCOPY WITH STENT PLACEMENT (Bilateral)  Patient location during evaluation: PACU Anesthesia Type: General Level of consciousness: awake and alert Pain management: pain level controlled Vital Signs Assessment: post-procedure vital signs reviewed and stable Respiratory status: spontaneous breathing, nonlabored ventilation, respiratory function stable and patient connected to nasal cannula oxygen Cardiovascular status: blood pressure returned to baseline and stable Postop Assessment: no signs of nausea or vomiting Anesthetic complications: no    Last Vitals:  Vitals:   03/05/16 1153 03/05/16 1200  BP:  111/74  Pulse:  67  Resp:  17  Temp: 36.6 C 36.2 C    Last Pain:  Vitals:   03/05/16 1153  TempSrc:   PainSc: 0-No pain                 Lenard Simmer

## 2016-03-05 NOTE — Transfer of Care (Signed)
Immediate Anesthesia Transfer of Care Note  Patient: [name]  Procedure(s) Performed: Procedure(s): CYSTOSCOPY WITH RETROGRADE PYELOGRAM (Bilateral) CYSTOSCOPY WITH STENT PLACEMENT (Bilateral)  Patient Location: PACU  Anesthesia Type:General  Level of Consciousness: awake and sedated  Airway & Oxygen Therapy: Patient Spontanous Breathing and Patient connected to face mask oxygen  Post-op Assessment: Report given to RN and Post -op Vital signs reviewed and stable  Post vital signs: Reviewed and stable  Last Vitals:  Vitals:   03/05/16 0434 03/05/16 1115  BP: 102/61 105/71  Pulse: 60 68  Resp: 20 13  Temp: 36.7 C 36.7 C    Last Pain:  Vitals:   03/05/16 1115  TempSrc: Tympanic  PainSc:       Patients Stated Pain Goal: 0 (03/05/16 0636)  Complications: No apparent anesthesia complications

## 2016-03-05 NOTE — H&P (Signed)
Patient voided bloody urine moderate amount

## 2016-03-05 NOTE — Consult Note (Signed)
9:51 AM   [name] 1971/01/10 [id number]  Referring provider: Dr. Camillia Herter  Chief Complaint  Patient presents with  . Flank Pain  . Nausea  . Emesis    HPI: The patient is a 45 year old male who is 2 days status post right lithotripsy presents with right flank pain, nausea, vomiting. His son have 3 stones in his right ureter as well as a stone in his left ureter. Markedly, he is still making urine and his creatinine is currently 1.2. His pain is modestly controlled. He's had no fevers or chills. He's had no left flank pain. He denies dysuria.     PMH: Past Medical History:  Diagnosis Date  . Acid reflux   . Hematuria   . Kidney stones   . Sleep apnea   . Urolithiasis     Surgical History: Past Surgical History:  Procedure Laterality Date  . EXTRACORPOREAL SHOCK WAVE LITHOTRIPSY Right 03/03/2016   Procedure: EXTRACORPOREAL SHOCK WAVE LITHOTRIPSY (ESWL);  Surgeon: Vanna Scotland, MD;  Location: [facility] ORS;  Service: Urology;  Laterality: Right;  . KNEE SURGERY    . LAPAROSCOPIC GASTRIC SLEEVE RESECTION      Home Medications:    Medication List    TAKE these medications   omeprazole 20 MG capsule Commonly known as:  PRILOSEC Take 20 mg by mouth at bedtime.   ondansetron 4 MG disintegrating tablet Commonly known as:  ZOFRAN ODT Take 1 tablet (4 mg total) by mouth every 8 (eight) hours as needed for nausea or vomiting.   oxyCODONE-acetaminophen 5-325 MG tablet Commonly known as:  ROXICET Take 1 tablet by mouth every 4 (four) hours as needed for severe pain.   tamsulosin 0.4 MG Caps capsule Commonly known as:  FLOMAX Take 1 capsule (0.4 mg total) by mouth daily after breakfast.       Allergies:  Allergies  Allergen Reactions  . Penicillins Itching and Rash    Has patient had a PCN reaction causing immediate rash, facial/tongue/throat swelling, SOB or lightheadedness with hypotension: No Has patient had a PCN reaction causing severe rash involving  mucus membranes or skin necrosis: No Has patient had a PCN reaction that required hospitalization No Has patient had a PCN reaction occurring within the last 10 years: No If all of the above answers are "NO", then may proceed with Cephalosporin use.    Family History: Family History  Problem Relation Age of Onset  . Prostate cancer Father   . Kidney disease Neg Hx   . Kidney cancer Neg Hx     Social History:  reports that he has never smoked. He has never used smokeless tobacco. He reports that he drinks alcohol. He reports that he does not use drugs.  ROS: 12 point ROS otherwise negative                                        Physical Exam: BP 102/61 (BP Location: Left Arm)   Pulse 60   Temp 98.1 F (36.7 C) (Oral)   Resp 20   Ht  (1.676 m)   Wt 188 lb 14.4 oz (85.7 kg)   SpO2 98%   BMI 30.49 kg/m   Constitutional:  Alert and oriented, No acute distress. HEENT: [place] AT, moist mucus membranes.  Trachea midline, no masses. Cardiovascular: No clubbing, cyanosis, or edema. Respiratory: Normal respiratory effort, no increased work of  breathing. GI: Abdomen is soft, nontender, nondistended, no abdominal masses GU: Right CVA tenderness Skin: No rashes, bruises or suspicious lesions. Lymph: No cervical or inguinal adenopathy. Neurologic: Grossly intact, no focal deficits, moving all 4 extremities. Psychiatric: Normal mood and affect.  Laboratory Data: Lab Results  Component Value Date   WBC 16.8 (H) 03/04/2016   HGB 15.4 03/04/2016   HCT 45.0 03/04/2016   MCV 80.5 03/04/2016   PLT 318 03/04/2016    Lab Results  Component Value Date   CREATININE 1.20 03/05/2016    No results found for: PSA  No results found for: TESTOSTERONE  Lab Results  Component Value Date   HGBA1C 5.7 04/11/2013    Urinalysis    Component Value Date/Time   COLORURINE STRAW (A) 03/04/2016 1549   APPEARANCEUR CLEAR (A) 03/04/2016 1549   APPEARANCEUR Clear  01/20/2016 1444   LABSPEC 1.005 03/04/2016 1549   LABSPEC 1.027 05/23/2013 1933   PHURINE 7.0 03/04/2016 1549   GLUCOSEU NEGATIVE 03/04/2016 1549   GLUCOSEU Negative 05/23/2013 1933   HGBUR 2+ (A) 03/04/2016 1549   BILIRUBINUR NEGATIVE 03/04/2016 1549   BILIRUBINUR Negative 01/20/2016 1444   BILIRUBINUR Negative 05/23/2013 1933   KETONESUR 1+ (A) 03/04/2016 1549   PROTEINUR NEGATIVE 03/04/2016 1549   NITRITE NEGATIVE 03/04/2016 1549   LEUKOCYTESUR NEGATIVE 03/04/2016 1549   LEUKOCYTESUR Negative 01/20/2016 1444   LEUKOCYTESUR Negative 05/23/2013 1933    Pertinent Imaging: CLINICAL DATA:  Right-sided flank pain. Lithotripsy done yesterday. Hematuria.  EXAM: CT ABDOMEN AND PELVIS WITHOUT CONTRAST  TECHNIQUE: Multidetector CT imaging of the abdomen and pelvis was performed following the standard protocol without IV contrast.  COMPARISON:  February 19, 2016 CT scan and March 03, 2016 KUB  FINDINGS: There is mild thickening of the distal esophagus. This was noted to represent a small hiatal hernia on the comparison CT scan. The lung bases are otherwise normal.  No free air or free fluid.  Multiple clustered stones seen in the lower right kidney. Several tiny stones are seen in the upper pole calices on the right. Several small stones are seen in the right renal pelvis. Two stones are seen in the proximal right ureter best seen on coronal image 37 with the largest measuring 4 mm in transverse dimension. The more inferior of the 2 measures 5.5 mm in cranial caudal dimension. Another stone is seen in the mid right ureter near the pelvic brim measuring nearly 4 mm. There is also a stone in the distal left ureter on axial image 79 measuring 3.5 mm. No other left ureteral stones are identified. There is bilateral hydronephrosis, right greater than left with no significant perinephric stranding on the left but mild perinephric stranding on the right. An exophytic mass off the  left kidney has been present since October 2014. It demonstrates an attenuation of 19 Hounsfield units today, likely a hyperdense cyst. Another adjacent cyst is seen in the left kidney. The patient is status post cholecystectomy. The liver, spleen, adrenal glands, and pancreas are normal. Non aneurysmal aorta with minimal atherosclerotic change. No adenopathy. Previous gastric surgery. The small bowel is normal. The colon is normal. No evidence of appendicitis.  The pelvis demonstrates a distal left ureteral stone. No adenopathy or mass.  No change in the bones.  IMPRESSION: 1. There are 3 stones in the right ureter and 1 stone in the distal left ureter with right greater than left hydronephrosis and right-sided perinephric stranding. This accounts for the patient's symptoms. Multiple other stones are  seen in the right kidney as described above.  Assessment & Plan:    1. Bilateral ureteral stones I discussed the patient has bilateral ureteral stones in his right flank pain. Though his creatinine is currently relatively normal, it is concerning that he has stones in both ureters and could lead to full obstruction and renal failure at some point. We discussed continued medical expulsive therapy versus bilateral ureteral stent placement. The patient has elected to undergo stent placement for both pain control and prevent renal failure. He understands the risks, benefits, indications this procedure. He understands the risks include but are not limited to bleeding, infection, iatrogenic injury, and the need for definitive stone management surgery at a later date. The patient is agreeable to proceeding.    Hildred Laser, MD  [facility] [address], [address] [place], [place] [address] [phone]

## 2016-03-05 NOTE — Op Note (Signed)
Date of procedure: 03/05/16  Preoperative diagnosis:  1. Bilateral ureteral stones 2. Bilateral hydronephrosis   Postoperative diagnosis:  1. Bilateral ureteral stones 2. Bilateral hydronephrosis   Procedure: 1. Cystoscopy 2. Bilateral retrograde pyelograms with interpretation 3. Bilateral ureteral stent placement 6 French by 26 cm  Surgeon: Baruch Gouty, MD  Anesthesia: General  Complications: None  Intraoperative findings: The patient had bilateral ureteral obstruction on retrograde pyelograms. The right was much worse than the left. He had tortuosity of his right ureter and high-grade obstruction. There are multiple stones within the right ureter with one being distal near the UVJ. On the left, he had a filling defect consistent with his known distal left ureteral stone with retention of contrast proximal this level. Bilateral ureteral stents were successfully placed.  EBL: None  Specimens: None  Drains: Bilateral 6 French by 26 cm double-J ureteral stents  Disposition: Stable to the postanesthesia care unit  Indication for procedure: The patient is a 45 y.o. male with bilateral ureteral obstruction. He has 3 stones in the right ureter status post lithotripsy a few days ago. He also has a stone 3.5 mm in size in the left ureter. Surprisingly, he has a creatinine of 1.2 and has been making urine. He presents today for bilateral ureteral stent placement due to severe right flank pain as well as to prevent acute renal failure from bilateral ureteral stones with obstruction.  After reviewing the management options for treatment, the patient elected to proceed with the above surgical procedure(s). We have discussed the potential benefits and risks of the procedure, side effects of the proposed treatment, the likelihood of the patient achieving the goals of the procedure, and any potential problems that might occur during the procedure or recuperation. Informed consent has been  obtained.  Description of procedure: The patient was met in the preoperative area. All risks, benefits, and indications of the procedure were described in great detail. The patient consented to the procedure. Preoperative antibiotics were given. The patient was taken to the operative theater. General anesthesia was induced per the anesthesia service. The patient was then placed in the dorsal lithotomy position and prepped and draped in the usual sterile fashion. A preoperative timeout was called.   A 21 French 30 cystoscope was inserted in the patient's bladder per urethra. A retrograde pyelogram was obtained a left with results described above. A sensor wire was advanced to the level of the left renal pelvis under fluoroscopy. A 6 [place] by 2670 double-J ureteral stent was then placed over this wire and the wire removed. A curl was seen in the patient's renal pelvis on fluoroscopy and in the urinary bladder direct visualization. Retrograde powder was obtained on the right with the results as documented above. A 6 [place] by 2670 double-J ureter stent was then placed on the right side in identical fashion as to the left side. There was significant drainage of clear yellow urine after placement of the stent from the right side. The patient's bladder was then drained. His anesthesia and transferred in stable condition to the post anesthesia care unit.  Plan: The patient will be discharged home. He'll follow-up in a few weeks to discuss bilateral ureteroscopy for definitive stone management.  Baruch Gouty, M.D.

## 2016-03-05 NOTE — Anesthesia Procedure Notes (Signed)
Procedure Name: LMA Insertion Date/Time: 03/05/2016 10:31 AM Performed by: Ginger Carne Pre-anesthesia Checklist: Patient identified, Emergency Drugs available, Suction available, Patient being monitored and Timeout performed Patient Re-evaluated:Patient Re-evaluated prior to inductionOxygen Delivery Method: Circle system utilized Preoxygenation: Pre-oxygenation with 100% oxygen Intubation Type: IV induction LMA: LMA inserted LMA Size: 4.5 Tube type: Oral Number of attempts: 1 Placement Confirmation: positive ETCO2 and breath sounds checked- equal and bilateral Tube secured with: Tape Dental Injury: Teeth and Oropharynx as per pre-operative assessment

## 2016-03-05 NOTE — Progress Notes (Signed)
[employer] -  at [facility]   PATIENT NAME: [name]    MR#:  [id number]  DATE OF BIRTH:  03/01/1971  SUBJECTIVE:   Pain controlled with medications.  REVIEW OF SYSTEMS:    Review of Systems  Constitutional: Positive for fever. Negative for chills and malaise/fatigue.  HENT: Negative.  Negative for ear discharge, ear pain, hearing loss, nosebleeds and sore throat.   Eyes: Negative.  Negative for blurred vision and pain.  Respiratory: Negative.  Negative for cough, hemoptysis, shortness of breath and wheezing.   Cardiovascular: Negative.  Negative for chest pain, palpitations and leg swelling.  Gastrointestinal: Negative.  Negative for abdominal pain, blood in stool, diarrhea, nausea and vomiting.  Genitourinary: Negative.  Negative for dysuria.  Musculoskeletal: Positive for back pain.  Skin: Negative.   Neurological: Negative for dizziness, tremors, speech change, focal weakness, seizures and headaches.  Endo/Heme/Allergies: Negative.  Does not bruise/bleed easily.  Psychiatric/Behavioral: Negative.  Negative for depression, hallucinations and suicidal ideas.    Tolerating Diet: npo      DRUG ALLERGIES:   Allergies  Allergen Reactions  . Penicillins Itching and Rash    Has patient had a PCN reaction causing immediate rash, facial/tongue/throat swelling, SOB or lightheadedness with hypotension: No Has patient had a PCN reaction causing severe rash involving mucus membranes or skin necrosis: No Has patient had a PCN reaction that required hospitalization No Has patient had a PCN reaction occurring within the last 10 years: No If all of the above answers are "NO", then may proceed with Cephalosporin use.    VITALS:  Blood pressure 105/71, pulse 68, temperature 98.1 F (36.7 C), temperature source Tympanic, resp. rate 13, height  (1.676 m), weight 85.7 kg (188 lb 14.4 oz), SpO2 100 %.  PHYSICAL EXAMINATION:   Physical Exam   Constitutional: He is oriented to person, place, and time and well-developed, well-nourished, and in no distress. No distress.  HENT:  Head: Normocephalic.  Eyes: No scleral icterus.  Neck: Normal range of motion. Neck supple. No JVD present. No tracheal deviation present.  Cardiovascular: Normal rate, regular rhythm and normal heart sounds.  Exam reveals no gallop and no friction rub.   No murmur heard. Pulmonary/Chest: Effort normal and breath sounds normal. No respiratory distress. He has no wheezes. He has no rales. He exhibits no tenderness.  Abdominal: Soft. Bowel sounds are normal. He exhibits no distension and no mass. There is no tenderness. There is no rebound and no guarding.  Musculoskeletal: Normal range of motion. He exhibits no edema.  Neurological: He is alert and oriented to person, place, and time.  Skin: Skin is warm. No rash noted. No erythema.  Psychiatric: Affect and judgment normal.      LABORATORY PANEL:   CBC  Recent Labs Lab 03/04/16 1443  WBC 16.8*  HGB 15.4  HCT 45.0  PLT 318   ------------------------------------------------------------------------------------------------------------------  Chemistries   Recent Labs Lab 03/05/16 0457  NA 138  K 3.9  CL 106  CO2 26  GLUCOSE 103*  BUN 18  CREATININE 1.20  CALCIUM 8.7*   ------------------------------------------------------------------------------------------------------------------  Cardiac Enzymes No results for input(s): TROPONINI in the last 168 hours. ------------------------------------------------------------------------------------------------------------------  RADIOLOGY:  Ct Renal Stone Study  Result Date: 03/04/2016 CLINICAL DATA:  Right-sided flank pain. Lithotripsy done yesterday. Hematuria. EXAM: CT ABDOMEN AND PELVIS WITHOUT CONTRAST TECHNIQUE: Multidetector CT imaging of the abdomen and pelvis was performed following the standard protocol without IV contrast.  COMPARISON:  February 19, 2016 CT  scan and March 03, 2016 KUB FINDINGS: There is mild thickening of the distal esophagus. This was noted to represent a small hiatal hernia on the comparison CT scan. The lung bases are otherwise normal. No free air or free fluid. Multiple clustered stones seen in the lower right kidney. Several tiny stones are seen in the upper pole calices on the right. Several small stones are seen in the right renal pelvis. Two stones are seen in the proximal right ureter best seen on coronal image 37 with the largest measuring 4 mm in transverse dimension. The more inferior of the 2 measures 5.5 mm in cranial caudal dimension. Another stone is seen in the mid right ureter near the pelvic brim measuring nearly 4 mm. There is also a stone in the distal left ureter on axial image 79 measuring 3.5 mm. No other left ureteral stones are identified. There is bilateral hydronephrosis, right greater than left with no significant perinephric stranding on the left but mild perinephric stranding on the right. An exophytic mass off the left kidney has been present since October 2014. It demonstrates an attenuation of 19 Hounsfield units today, likely a hyperdense cyst. Another adjacent cyst is seen in the left kidney. The patient is status post cholecystectomy. The liver, spleen, adrenal glands, and pancreas are normal. Non aneurysmal aorta with minimal atherosclerotic change. No adenopathy. Previous gastric surgery. The small bowel is normal. The colon is normal. No evidence of appendicitis. The pelvis demonstrates a distal left ureteral stone. No adenopathy or mass. No change in the bones. IMPRESSION: 1. There are 3 stones in the right ureter and 1 stone in the distal left ureter with right greater than left hydronephrosis and right-sided perinephric stranding. This accounts for the patient's symptoms. Multiple other stones are seen in the right kidney as described above. Electronically Signed   By: Gerome Sam III M.D   On: 03/04/2016 18:29     ASSESSMENT AND PLAN:   45 year old male with history of kidney stones who presents after lithotripsy with bilateral ureteral stones.  1. Bilateral ureteral stones: Patient is plan for cystoscopy and stent placement today. Continue pain medications and Flomax. Appreciate urology  Consult Continue fluids.  2. GERD: Continue PPI    Management plans discussed with the patient and he is in agreement. Discussed with urology  CODE STATUS: full  TOTAL TIME TAKING CARE OF THIS PATIENT: 30 minutes.     POSSIBLE D/C today, DEPENDING ON CLINICAL CONDITION.   Kolsen Choe M.D on 03/05/2016 at 11:22 AM  Between 7am to 6pm - Pager - [phone] After 6pm go to [email or web address] - password [email or web address]  [facility]  Office  [phone]  CC: Primary care physician; Dione Housekeeper, MD  Note: This dictation was prepared with Dragon dictation along with smaller phrase technology. Any transcriptional errors that result from this process are unintentional.

## 2016-03-06 LAB — URINE CULTURE: CULTURE: NO GROWTH

## 2016-03-07 ENCOUNTER — Encounter: Payer: Self-pay | Admitting: Urology

## 2016-03-07 ENCOUNTER — Telehealth: Payer: Self-pay | Admitting: Urology

## 2016-03-07 NOTE — Telephone Encounter (Signed)
Spoke with pt in reference to returning to work after stent placement. 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 Per Dr. Sherryl BartersBudzyn pt can return to work today without any restrictions. Made pt aware. Pt voiced understanding. Pt then voiced concern of what his "plan" is for the stones. Per Dr. Sherryl BartersBudzyn pt should keep f/u appt to discuss further stone management. Pt voiced understanding.

## 2016-03-08 ENCOUNTER — Encounter: Payer: Self-pay | Admitting: Urology

## 2016-03-09 ENCOUNTER — Telehealth: Payer: Self-pay

## 2016-03-09 NOTE — Telephone Encounter (Signed)
Pt called stating his urine continues to have blood in it, dysuria, and increased frequency. Reinforced with pt to continue to drink plenty of fluids and take d/c medications. Reinforced with pt these symptoms are normal for a stent being placed. Pt voiced understanding of whole conversation.

## 2016-03-10 NOTE — Interval H&P Note (Signed)
History and Physical Interval Note:  03/10/2016 9:21 PM  [name]  has presented today for surgery, with the diagnosis of kidney stones  The various methods of treatment have been discussed with the patient and family. After consideration of risks, benefits and other options for treatment, the patient has consented to  Procedure(s): EXTRACORPOREAL SHOCK WAVE LITHOTRIPSY (ESWL) (Right) as a surgical intervention .  The patient's history has been reviewed, patient examined, no change in status, stable for surgery.  I have reviewed the patient's chart and labs.  Questions were answered to the patient's satisfaction.     Vanna ScotlandAshley Krishna Dancel

## 2016-03-10 NOTE — H&P (View-Only) (Signed)
Full consult to follow in AM   Patient with right lithotripsy yesterday. Now with stones in right ureter as well as small stone in left ureter. Presented with right flank pain/n/v.  Cr: 1.29. Pain now controlled. No signs of sepsis.  Bilateral ureteral stones concerning though Cr is relatively normal.   Recommend: -admission with IV fluids -repeat Cr around 6 AM saturday -strain all urine -flomax 0.4 mg daily. First dose now. -NPO after midnight -will hold OR spot for cystoscopy, bilateral ureteral stents for late Saturday morning pending Cr results and discussion with patient in the Morning. No emergent intervention tonight given patient's Cr and lack of signs of infection

## 2016-03-15 ENCOUNTER — Encounter: Payer: Self-pay | Admitting: Emergency Medicine

## 2016-03-15 ENCOUNTER — Inpatient Hospital Stay: Payer: BLUE CROSS/BLUE SHIELD

## 2016-03-15 ENCOUNTER — Inpatient Hospital Stay
Admission: EM | Admit: 2016-03-15 | Discharge: 2016-03-18 | DRG: 872 | Disposition: A | Discharge status: Home / Self Care | Payer: BLUE CROSS/BLUE SHIELD | Attending: Internal Medicine | Admitting: Internal Medicine

## 2016-03-15 DIAGNOSIS — N12 Tubulo-interstitial nephritis, not specified as acute or chronic: Secondary | ICD-10-CM

## 2016-03-15 DIAGNOSIS — Z8042 Family history of malignant neoplasm of prostate: Secondary | ICD-10-CM

## 2016-03-15 DIAGNOSIS — Z9884 Bariatric surgery status: Secondary | ICD-10-CM | POA: Diagnosis not present

## 2016-03-15 DIAGNOSIS — A419 Sepsis, unspecified organism: Secondary | ICD-10-CM | POA: Diagnosis not present

## 2016-03-15 DIAGNOSIS — N136 Pyonephrosis: Secondary | ICD-10-CM | POA: Diagnosis present

## 2016-03-15 DIAGNOSIS — N200 Calculus of kidney: Secondary | ICD-10-CM | POA: Diagnosis not present

## 2016-03-15 DIAGNOSIS — N1330 Unspecified hydronephrosis: Secondary | ICD-10-CM | POA: Diagnosis not present

## 2016-03-15 DIAGNOSIS — K219 Gastro-esophageal reflux disease without esophagitis: Secondary | ICD-10-CM | POA: Diagnosis present

## 2016-03-15 DIAGNOSIS — N390 Urinary tract infection, site not specified: Secondary | ICD-10-CM | POA: Diagnosis present

## 2016-03-15 DIAGNOSIS — Z87442 Personal history of urinary calculi: Secondary | ICD-10-CM

## 2016-03-15 DIAGNOSIS — R310 Gross hematuria: Secondary | ICD-10-CM | POA: Diagnosis present

## 2016-03-15 DIAGNOSIS — N201 Calculus of ureter: Secondary | ICD-10-CM | POA: Diagnosis not present

## 2016-03-15 DIAGNOSIS — G4730 Sleep apnea, unspecified: Secondary | ICD-10-CM | POA: Diagnosis present

## 2016-03-15 LAB — URINALYSIS COMPLETE WITH MICROSCOPIC (ARMC ONLY)
BILIRUBIN URINE: NEGATIVE
Glucose, UA: NEGATIVE mg/dL
Ketones, ur: NEGATIVE mg/dL
NITRITE: POSITIVE — AB
PH: 8 (ref 5.0–8.0)
SQUAMOUS EPITHELIAL / LPF: NONE SEEN
Specific Gravity, Urine: 1.015 (ref 1.005–1.030)

## 2016-03-15 LAB — CBC
HCT: 41.4 % (ref 40.0–52.0)
Hemoglobin: 13.8 g/dL (ref 13.0–18.0)
MCH: 26.8 pg (ref 26.0–34.0)
MCHC: 33.3 g/dL (ref 32.0–36.0)
MCV: 80.7 fL (ref 80.0–100.0)
PLATELETS: 339 10*3/uL (ref 150–440)
RBC: 5.13 MIL/uL (ref 4.40–5.90)
RDW: 12.7 % (ref 11.5–14.5)
WBC: 11.5 10*3/uL — ABNORMAL HIGH (ref 3.8–10.6)

## 2016-03-15 LAB — BASIC METABOLIC PANEL
Anion gap: 7 (ref 5–15)
BUN: 11 mg/dL (ref 6–20)
CALCIUM: 9.2 mg/dL (ref 8.9–10.3)
CO2: 26 mmol/L (ref 22–32)
Chloride: 102 mmol/L (ref 101–111)
Creatinine, Ser: 0.99 mg/dL (ref 0.61–1.24)
Glucose, Bld: 104 mg/dL — ABNORMAL HIGH (ref 65–99)
Potassium: 3.7 mmol/L (ref 3.5–5.1)
Sodium: 135 mmol/L (ref 135–145)

## 2016-03-15 LAB — LACTIC ACID, PLASMA: LACTIC ACID, VENOUS: 1.3 mmol/L (ref 0.5–1.9)

## 2016-03-15 MED ORDER — SODIUM CHLORIDE 0.9 % IV BOLUS (SEPSIS)
1000.0000 mL | Freq: Once | INTRAVENOUS | Status: AC
  Administered 2016-03-15: 1000 mL via INTRAVENOUS

## 2016-03-15 MED ORDER — LEVOFLOXACIN IN D5W 750 MG/150ML IV SOLN
INTRAVENOUS | Status: AC
  Administered 2016-03-15: 750 mg via INTRAVENOUS
  Filled 2016-03-15: qty 150

## 2016-03-15 MED ORDER — MORPHINE SULFATE (PF) 2 MG/ML IV SOLN
2.0000 mg | Freq: Once | INTRAVENOUS | Status: AC
  Administered 2016-03-15: 2 mg via INTRAVENOUS

## 2016-03-15 MED ORDER — MORPHINE SULFATE (PF) 2 MG/ML IV SOLN
INTRAVENOUS | Status: AC
  Administered 2016-03-15: 2 mg via INTRAVENOUS
  Filled 2016-03-15: qty 1

## 2016-03-15 MED ORDER — DEXTROSE 5 % IV SOLN
2.0000 g | Freq: Once | INTRAVENOUS | Status: AC
  Administered 2016-03-15: 2 g via INTRAVENOUS
  Filled 2016-03-15: qty 2

## 2016-03-15 MED ORDER — LEVOFLOXACIN IN D5W 750 MG/150ML IV SOLN
750.0000 mg | Freq: Once | INTRAVENOUS | Status: AC
  Administered 2016-03-15: 750 mg via INTRAVENOUS

## 2016-03-15 MED ORDER — ACETAMINOPHEN 325 MG PO TABS
650.0000 mg | ORAL_TABLET | Freq: Four times a day (QID) | ORAL | Status: DC | PRN
  Administered 2016-03-16: 650 mg via ORAL
  Filled 2016-03-15: qty 2

## 2016-03-15 MED ORDER — ACETAMINOPHEN 650 MG RE SUPP: 650.0000 mg | Freq: Four times a day (QID) | RECTAL | Status: DC | PRN

## 2016-03-15 NOTE — ED Notes (Signed)
Patient transported to Ultrasound 

## 2016-03-15 NOTE — ED Notes (Signed)
Pt to ED for fever, increased weakness. Pt has lithotripsy last week and bilat stents placed. Pt c/o flu like symptoms. Pt states still hematuria and tenderness to lower abd. Pt A&O. Code sepsis called at this time

## 2016-03-15 NOTE — Progress Notes (Addendum)
Pharmacy Antibiotic Note  [name] is a 45 y.o. male admitted on 03/15/2016 with UTI.  Pharmacy has been consulted for aztreonam and levofloxacin dosing. Patient has history of recent hospital stay for urinary stents and positive fever.   Plan: 1. Aztreonam 2 gm IV x 1 in ED followed by aztreonam 1 gm IV Q8H. 2. Levofloxacin 750 mg IV Q24H   Height: 5\' 6"  (167.6 cm) Weight: 187 lb (84.8 kg) IBW/kg (Calculated) : 63.8  Temp (24hrs), Avg:100.7 F (38.2 C), Min:100.3 F (37.9 C), Max:101.1 F (38.4 C)   Recent Labs Lab 03/15/16 1835  WBC 11.5*  CREATININE 0.99    Estimated Creatinine Clearance: 94.2 mL/min (by C-G formula based on SCr of 0.99 mg/dL).    Allergies  Allergen Reactions  . Penicillins Itching and Rash    Has patient had a PCN reaction causing immediate rash, facial/tongue/throat swelling, SOB or lightheadedness with hypotension: No Has patient had a PCN reaction causing severe rash involving mucus membranes or skin necrosis: No Has patient had a PCN reaction that required hospitalization No Has patient had a PCN reaction occurring within the last 10 years: No If all of the above answers are "NO", then may proceed with Cephalosporin use.    Thank you for allowing pharmacy to be a part of this patient's care.  Carola FrostNathan A Kashina Mecum, Pharm.D., BCPS Clinical Pharmacist 03/15/2016 10:28 PM

## 2016-03-15 NOTE — ED Provider Notes (Signed)
[facility] Emergency Department Provider Note   ____________________________________________   First MD Initiated Contact with Patient 03/15/16 2142     (approximate)  I have reviewed the triage vital signs and the nursing notes.   HISTORY  Chief Complaint Fever; Urinary Frequency; and Back Pain   HPI [name] is a 45 y.o. male with a history of recent hospitalization for kidney stones who is presenting to the emergency department today with a fever. He says that he was in the hospital about 10 days ago with multiple kidney stones. He had a lithotripsy with bilateral ureteral stenting. He began to develop a fever this morning of 99.9 which was to maximum of 102.4 home. He says he has had body aches as well as right back pain today which has been worsening. Not currently on any antibiotics but had several doses of antibiotics over the first 2 days when he came home from the hospital.   Past Medical History:  Diagnosis Date  . Acid reflux   . Hematuria   . Kidney stones   . Sleep apnea   . Urolithiasis     Patient Active Problem List   Diagnosis Date Noted  . Nephrolithiasis 03/04/2016    Past Surgical History:  Procedure Laterality Date  . CYSTOSCOPY W/ RETROGRADES Bilateral 03/05/2016   Procedure: CYSTOSCOPY WITH RETROGRADE PYELOGRAM;  Surgeon: Hildred LaserBrian James Budzyn, MD;  Location: [facility] ORS;  Service: Urology;  Laterality: Bilateral;  . CYSTOSCOPY WITH STENT PLACEMENT Bilateral 03/05/2016   Procedure: CYSTOSCOPY WITH STENT PLACEMENT;  Surgeon: Hildred LaserBrian James Budzyn, MD;  Location: [facility] ORS;  Service: Urology;  Laterality: Bilateral;  . EXTRACORPOREAL SHOCK WAVE LITHOTRIPSY Right 03/03/2016   Procedure: EXTRACORPOREAL SHOCK WAVE LITHOTRIPSY (ESWL);  Surgeon: Vanna ScotlandAshley Brandon, MD;  Location: [facility] ORS;  Service: Urology;  Laterality: Right;  . KNEE SURGERY    . LAPAROSCOPIC GASTRIC SLEEVE RESECTION      Prior to Admission medications   Medication Sig  Start Date End Date Taking? Authorizing Provider  omeprazole (PRILOSEC) 20 MG capsule Take 20 mg by mouth at bedtime.  12/30/15 12/29/16 Yes Historical Provider, MD  oxyCODONE-acetaminophen (ROXICET) 5-325 MG tablet Take 1 tablet by mouth every 4 (four) hours as needed for severe pain. 03/05/16  Yes Adrian SaranSital Mody, MD  tamsulosin (FLOMAX) 0.4 MG CAPS capsule Take 1 capsule (0.4 mg total) by mouth daily after breakfast. 03/05/16  Yes Adrian SaranSital Mody, MD  ciprofloxacin (CIPRO) 500 MG tablet Take 1 tablet (500 mg total) by mouth 2 (two) times daily. Patient not taking: Reported on 03/15/2016 03/05/16   Hildred LaserBrian James Budzyn, MD  ondansetron (ZOFRAN ODT) 4 MG disintegrating tablet Take 1 tablet (4 mg total) by mouth every 8 (eight) hours as needed for nausea or vomiting. Patient not taking: Reported on 03/15/2016 02/19/16   Darci Currentandolph N Brown, MD    Allergies Penicillins  Family History  Problem Relation Age of Onset  . Prostate cancer Father   . Kidney disease Neg Hx   . Kidney cancer Neg Hx     Social History Social History  Substance Use Topics  . Smoking status: Never Smoker  . Smokeless tobacco: Never Used  . Alcohol use 0.0 oz/week     Comment: occ.     Review of Systems Constitutional:As above Eyes: No visual changes. ENT: No sore throat. Cardiovascular: Denies chest pain. Respiratory: Denies shortness of breath. Gastrointestinal: No abdominal pain.  No nausea, no vomiting.  No diarrhea.  No constipation. Genitourinary: Negative for dysuria.  Musculoskeletal: As above Skin: Negative for rash. Neurological: Negative for headaches, focal weakness or numbness.  10-point ROS otherwise negative.  ____________________________________________   PHYSICAL EXAM:  VITAL SIGNS: ED Triage Vitals  Enc Vitals Group     BP 03/15/16 1832 129/82     Pulse Rate 03/15/16 1832 94     Resp 03/15/16 1832 20     Temp 03/15/16 1832 (!) 101.1 F (38.4 C)     Temp Source 03/15/16 1832 Oral     SpO2 03/15/16  1832 100 %     Weight 03/15/16 1832 187 lb (84.8 kg)     Height 03/15/16 1832 5\' 6"  (1.676 m)     Head Circumference --      Peak Flow --      Pain Score 03/15/16 1833 3     Pain Loc --      Pain Edu? --      Excl. in GC? --     Constitutional: Alert and oriented. Well appearing and in no acute distress. Eyes: Conjunctivae are normal. PERRL. EOMI. Head: Atraumatic. Nose: No congestion/rhinnorhea. Mouth/Throat: Mucous membranes are moist.  Neck: No stridor.   Cardiovascular: Normal rate, regular rhythm. Grossly normal heart sounds.  Good peripheral circulation. Respiratory: Normal respiratory effort.  No retractions. Lungs CTAB. Gastrointestinal: Soft With mild right upper quadrant tenderness palpation. No distention.  Mild right-sided CVA tenderness palpation.  Musculoskeletal: No lower extremity tenderness nor edema.  No joint effusions. Neurologic:  Normal speech and language. No gross focal neurologic deficits are appreciated. No gait instability. Skin:  Skin is warm, dry and intact. No rash noted. Psychiatric: Mood and affect are normal. Speech and behavior are normal.  ____________________________________________   LABS (all labs ordered are listed, but only abnormal results are displayed)  Labs Reviewed  URINALYSIS COMPLETEWITH MICROSCOPIC ([facility] ONLY) - Abnormal; Notable for the following:       Result Value   Color, Urine YELLOW (*)    APPearance CLOUDY (*)    Hgb urine dipstick 3+ (*)    Protein, ur >500 (*)    Nitrite POSITIVE (*)    Leukocytes, UA 3+ (*)    Bacteria, UA FEW (*)    All other components within normal limits  BASIC METABOLIC PANEL - Abnormal; Notable for the following:    Glucose, Bld 104 (*)    All other components within normal limits  CBC - Abnormal; Notable for the following:    WBC 11.5 (*)    All other components within normal limits  URINE CULTURE  CULTURE, BLOOD (ROUTINE X 2)  CULTURE, BLOOD (ROUTINE X 2)  LACTIC ACID, PLASMA    LACTIC ACID, PLASMA   ____________________________________________  EKG   ____________________________________________  RADIOLOGY  Pending renal ultrasound ____________________________________________   PROCEDURES  Procedure(s) performed:  Procedures  Critical Care performed:   ____________________________________________   INITIAL IMPRESSION / ASSESSMENT AND PLAN / ED COURSE  Pertinent labs & imaging results that were available during my care of the patient were reviewed by me and considered in my medical decision making (see chart for details).  ----------------------------------------- 10:43 PM on 03/15/2016 -----------------------------------------  Discussed case with Roanna Raider of urology who agrees with admission to the hospital and IV antibiotics. She recommends checking a renal ultrasound to make sure that the stents are still flowing at there is no obstruction. Sepsis alert was called on this patient. I'll sign the patient out to the hospitalist, Dr. Emmit Pomfret, who will follow the ultrasound. The patient understands the  need for admission and is willing to comply.  Clinical Course     ____________________________________________   FINAL CLINICAL IMPRESSION(S) / ED DIAGNOSES  Final diagnoses:  Pyelonephritis  Sepsis, due to unspecified organism [facility](HCC)      NEW MEDICATIONS STARTED DURING THIS VISIT:  New Prescriptions   No medications on file     Note:  This document was prepared using Dragon voice recognition software and may include unintentional dictation errors.    Myrna Blazeravid Matthew Rosamary Boudreau, MD 03/15/16 [phone]

## 2016-03-15 NOTE — ED Triage Notes (Signed)
Pt presents with fever started this am. Also has urinary frequency. Pt recently had urinary stents placed at this hospital.

## 2016-03-16 ENCOUNTER — Inpatient Hospital Stay: Payer: BLUE CROSS/BLUE SHIELD

## 2016-03-16 DIAGNOSIS — N12 Tubulo-interstitial nephritis, not specified as acute or chronic: Secondary | ICD-10-CM

## 2016-03-16 DIAGNOSIS — N201 Calculus of ureter: Secondary | ICD-10-CM

## 2016-03-16 DIAGNOSIS — A419 Sepsis, unspecified organism: Principal | ICD-10-CM

## 2016-03-16 DIAGNOSIS — N200 Calculus of kidney: Secondary | ICD-10-CM

## 2016-03-16 DIAGNOSIS — N1330 Unspecified hydronephrosis: Secondary | ICD-10-CM

## 2016-03-16 LAB — CBC
HEMATOCRIT: 39 % — AB (ref 40.0–52.0)
HEMOGLOBIN: 12.7 g/dL — AB (ref 13.0–18.0)
MCH: 26.4 pg (ref 26.0–34.0)
MCHC: 32.6 g/dL (ref 32.0–36.0)
MCV: 81 fL (ref 80.0–100.0)
Platelets: 290 10*3/uL (ref 150–440)
RBC: 4.81 MIL/uL (ref 4.40–5.90)
RDW: 12.8 % (ref 11.5–14.5)
WBC: 11.9 10*3/uL — ABNORMAL HIGH (ref 3.8–10.6)

## 2016-03-16 LAB — GLUCOSE, CAPILLARY: Glucose-Capillary: 103 mg/dL — ABNORMAL HIGH (ref 65–99)

## 2016-03-16 LAB — COMPREHENSIVE METABOLIC PANEL
ALT: 11 U/L — ABNORMAL LOW (ref 17–63)
ANION GAP: 5 (ref 5–15)
AST: 12 U/L — AB (ref 15–41)
Albumin: 3.1 g/dL — ABNORMAL LOW (ref 3.5–5.0)
Alkaline Phosphatase: 54 U/L (ref 38–126)
BILIRUBIN TOTAL: 0.9 mg/dL (ref 0.3–1.2)
BUN: 9 mg/dL (ref 6–20)
CHLORIDE: 108 mmol/L (ref 101–111)
CO2: 21 mmol/L — AB (ref 22–32)
Calcium: 8 mg/dL — ABNORMAL LOW (ref 8.9–10.3)
Creatinine, Ser: 0.8 mg/dL (ref 0.61–1.24)
GFR calc Af Amer: >60 mL/min (ref >60)
GFR calc non Af Amer: >60 mL/min (ref >60)
GLUCOSE: 104 mg/dL — AB (ref 65–99)
POTASSIUM: 3.4 mmol/L — AB (ref 3.5–5.1)
SODIUM: 134 mmol/L — AB (ref 135–145)
TOTAL PROTEIN: 6 g/dL — AB (ref 6.5–8.1)

## 2016-03-16 LAB — LACTIC ACID, PLASMA: Lactic Acid, Venous: 1 mmol/L (ref 0.5–1.9)

## 2016-03-16 MED ORDER — OXYCODONE-ACETAMINOPHEN 5-325 MG PO TABS
1.0000 | ORAL_TABLET | ORAL | Status: DC | PRN
  Administered 2016-03-16: 1 via ORAL
  Filled 2016-03-16: qty 1

## 2016-03-16 MED ORDER — ONDANSETRON HCL 4 MG PO TABS: 4.0000 mg | ORAL_TABLET | Freq: Four times a day (QID) | ORAL | Status: DC | PRN

## 2016-03-16 MED ORDER — TAMSULOSIN HCL 0.4 MG PO CAPS
0.4000 mg | ORAL_CAPSULE | Freq: Every day | ORAL | Status: DC
  Administered 2016-03-16 – 2016-03-18 (×3): 0.4 mg via ORAL
  Filled 2016-03-16 (×3): qty 1

## 2016-03-16 MED ORDER — PANTOPRAZOLE SODIUM 40 MG PO TBEC
40.0000 mg | DELAYED_RELEASE_TABLET | Freq: Every day | ORAL | Status: DC
  Administered 2016-03-16 – 2016-03-17 (×3): 40 mg via ORAL
  Filled 2016-03-16 (×3): qty 1

## 2016-03-16 MED ORDER — DEXTROSE 5 % IV SOLN
1.0000 g | Freq: Three times a day (TID) | INTRAVENOUS | Status: DC
  Administered 2016-03-16 – 2016-03-17 (×5): 1 g via INTRAVENOUS
  Filled 2016-03-16 (×6): qty 1

## 2016-03-16 MED ORDER — LEVOFLOXACIN IN D5W 750 MG/150ML IV SOLN
750.0000 mg | INTRAVENOUS | Status: DC
  Filled 2016-03-16: qty 150

## 2016-03-16 MED ORDER — OXYCODONE-ACETAMINOPHEN 5-325 MG PO TABS
1.0000 | ORAL_TABLET | ORAL | Status: DC | PRN
  Administered 2016-03-16 – 2016-03-17 (×3): 1 via ORAL
  Filled 2016-03-16 (×3): qty 1

## 2016-03-16 MED ORDER — ONDANSETRON HCL 4 MG/2ML IJ SOLN: 4.0000 mg | Freq: Four times a day (QID) | INTRAMUSCULAR | Status: DC | PRN

## 2016-03-16 MED ORDER — POTASSIUM CHLORIDE CRYS ER 20 MEQ PO TBCR
40.0000 meq | EXTENDED_RELEASE_TABLET | Freq: Once | ORAL | Status: AC
  Administered 2016-03-16: 40 meq via ORAL
  Filled 2016-03-16: qty 2

## 2016-03-16 MED ORDER — SODIUM CHLORIDE 0.9 % IV SOLN
INTRAVENOUS | Status: DC
  Administered 2016-03-16 – 2016-03-18 (×5): via INTRAVENOUS

## 2016-03-16 NOTE — H&P (Addendum)
[facility] [place] [facility] Admission History and Physical Tonye Royalty, D.O.  ---------------------------------------------------------------------------------------------------------------------   PATIENT NAME: [name] MR#: [id number] DATE OF BIRTH: May 05, 1971 DATE OF ADMISSION: 03/15/2016 PRIMARY CARE PHYSICIAN: Dione Housekeeper, MD  REQUESTING/REFERRING PHYSICIAN: ED Dr. Pershing Proud  CHIEF COMPLAINT: Chief Complaint  Patient presents with  . Fever  . Urinary Frequency  . Back Pain    HISTORY OF PRESENT ILLNESS: [name] is a 45 y.o. male with a known history of nephrolithiasis status post ureteral stenting was in a usual state of health until today when he developed a fever to 102 at home associated with low back pain, abdominal pain, myalgia and fatigue. Patient states that he was hospitalized 10 days ago with lithotripsy and bilateral ureteral stenting. Since the procedure he has been having polyuria with gross hematuria and was generally feeling unwell but today became acutely ill with the above symptoms. He was discharged on antibiotics but completed that about 8 days ago.  Otherwise there has been no change in status. Patient has been taking medication as prescribed and there has been no recent change in medication or diet.  There has been no recent illness, travel or sick contacts.    Patient denies chills, weakness, dizziness, chest pain, shortness of breath, N/V/C/D, changes in mental status.    PAST MEDICAL HISTORY: Past Medical History:  Diagnosis Date  . Acid reflux   . Hematuria   . Kidney stones   . Sleep apnea   . Urolithiasis       PAST SURGICAL HISTORY: Past Surgical History:  Procedure Laterality Date  . CYSTOSCOPY W/ RETROGRADES Bilateral 03/05/2016   Procedure: CYSTOSCOPY WITH RETROGRADE PYELOGRAM;  Surgeon: Hildred Laser, MD;  Location: [facility] ORS;  Service: Urology;  Laterality: Bilateral;  . CYSTOSCOPY WITH STENT  PLACEMENT Bilateral 03/05/2016   Procedure: CYSTOSCOPY WITH STENT PLACEMENT;  Surgeon: Hildred Laser, MD;  Location: [facility] ORS;  Service: Urology;  Laterality: Bilateral;  . EXTRACORPOREAL SHOCK WAVE LITHOTRIPSY Right 03/03/2016   Procedure: EXTRACORPOREAL SHOCK WAVE LITHOTRIPSY (ESWL);  Surgeon: Vanna Scotland, MD;  Location: [facility] ORS;  Service: Urology;  Laterality: Right;  . KNEE SURGERY    . LAPAROSCOPIC GASTRIC SLEEVE RESECTION        SOCIAL HISTORY: Social History  Substance Use Topics  . Smoking status: Never Smoker  . Smokeless tobacco: Never Used  . Alcohol use 0.0 oz/week     Comment: occ.       FAMILY HISTORY: Family History  Problem Relation Age of Onset  . Prostate cancer Father   . Kidney disease Neg Hx   . Kidney cancer Neg Hx      MEDICATIONS AT HOME: Prior to Admission medications   Medication Sig Start Date End Date Taking? Authorizing Provider  omeprazole (PRILOSEC) 20 MG capsule Take 20 mg by mouth at bedtime.  12/30/15 12/29/16 Yes Historical Provider, MD  oxyCODONE-acetaminophen (ROXICET) 5-325 MG tablet Take 1 tablet by mouth every 4 (four) hours as needed for severe pain. 03/05/16  Yes Adrian Saran, MD  tamsulosin (FLOMAX) 0.4 MG CAPS capsule Take 1 capsule (0.4 mg total) by mouth daily after breakfast. 03/05/16  Yes Adrian Saran, MD  ciprofloxacin (CIPRO) 500 MG tablet Take 1 tablet (500 mg total) by mouth 2 (two) times daily. Patient not taking: Reported on 03/15/2016 03/05/16   Hildred Laser, MD  ondansetron (ZOFRAN ODT) 4 MG disintegrating tablet Take 1 tablet (4 mg total) by mouth every 8 (eight) hours as needed  for nausea or vomiting. Patient not taking: Reported on 03/15/2016 02/19/16   Darci Currentandolph N Brown, MD      DRUG ALLERGIES: Allergies  Allergen Reactions  . Penicillins Itching and Rash    Has patient had a PCN reaction causing immediate rash, facial/tongue/throat swelling, SOB or lightheadedness with hypotension: No Has patient had a PCN reaction  causing severe rash involving mucus membranes or skin necrosis: No Has patient had a PCN reaction that required hospitalization No Has patient had a PCN reaction occurring within the last 10 years: No If all of the above answers are "NO", then may proceed with Cephalosporin use.     REVIEW OF SYSTEMS: CONSTITUTIONAL: Positive fever, fatigue, weakness, negative weight gain/loss, headache EYES: No blurry or double vision. ENT: No tinnitus, postnasal drip, redness or soreness of the oropharynx. RESPIRATORY: No cough, wheeze, hemoptysis, dyspnea. CARDIOVASCULAR: No chest pain, orthopnea, palpitations, syncope. GASTROINTESTINAL: No nausea, vomiting, constipation, diarrhea, abdominal pain, hematemesis, melena or hematochezia. GENITOURINARY: Positive dysuria and hematuria. ENDOCRINE: Positive  polyuria neg nocturia. No heat or cold intolerance. HEMATOLOGY: No anemia, bruising, bleeding. INTEGUMENTARY: No rashes, ulcers, lesions. MUSCULOSKELETAL: No arthritis, swelling, gout. NEUROLOGIC: No numbness, tingling, weakness or ataxia. No seizure-type activity. PSYCHIATRIC: No anxiety, depression, insomnia.  PHYSICAL EXAMINATION: VITAL SIGNS: Blood pressure 127/79, pulse 86, temperature 99.6 F (37.6 C), temperature source Oral, resp. rate 19, height 5\' 6"  (1.676 m), weight 86.6 kg (190 lb 14.4 oz), SpO2 99 %.  GENERAL: 45 y.o.-year-old white male patient, well-developed, well-nourished lying in the bed in no acute distress.  Pleasant and cooperative.   HEENT: Head atraumatic, normocephalic. Pupils equal, round, reactive to light and accommodation. No scleral icterus. Extraocular muscles intact. Nares are patent. Oropharynx is clear. Mucus membranes moist. NECK: Supple, full range of motion. No JVD, no bruit heard. No thyroid enlargement, no tenderness, no cervical lymphadenopathy. CHEST: Normal breath sounds bilaterally. No wheezing, rales, rhonchi or crackles. No use of accessory muscles of  respiration.  No reproducible chest wall tenderness.  CARDIOVASCULAR: S1, S2 normal. No murmurs, rubs, or gallops. Cap refill <2 seconds. ABDOMEN: Soft, nontender, nondistended. No rebound, guarding, rigidity. Normoactive bowel sounds present in all four quadrants. No organomegaly or mass.  Positive right-sided CVA tenderness EXTREMITIES: Full range of motion. No pedal edema, cyanosis, or clubbing. NEUROLOGIC: Cranial nerves II through XII are grossly intact with no focal sensorimotor deficit. Muscle strength 5/5 in all extremities. Sensation intact. Gait not checked. PSYCHIATRIC: The patient is alert and oriented x 3. Normal affect, mood, thought content. SKIN: Warm, dry, and intact without obvious rash, lesion, or ulcer.  LABORATORY PANEL:  CBC  Recent Labs Lab 03/16/16 0043  WBC 11.9*  HGB 12.7*  HCT 39.0*  PLT 290   ----------------------------------------------------------------------------------------------------------------- Chemistries  Recent Labs Lab 03/15/16 1835  NA 135  K 3.7  CL 102  CO2 26  GLUCOSE 104*  BUN 11  CREATININE 0.99  CALCIUM 9.2   ------------------------------------------------------------------------------------------------------------------ Cardiac Enzymes No results for input(s): TROPONINI in the last 168 hours. ------------------------------------------------------------------------------------------------------------------  RADIOLOGY: [place] Renal  Result Date: 03/16/2016 CLINICAL DATA:  Acute onset of fever. Bilateral ureteral stents in place. Initial encounter. EXAM: RENAL / URINARY TRACT ULTRASOUND COMPLETE COMPARISON:  CT of the abdomen and pelvis performed 03/04/2016 FINDINGS: Right Kidney: Length: 11.3 cm. Echogenicity within normal limits. No mass or hydronephrosis visualized. Left Kidney: Length: 12.7 cm. Echogenicity within normal limits. No mass seen. Mild left-sided hydronephrosis is noted. Bladder: Appears normal for degree of  bladder distention. Bilateral ureteral stents are seen  ending at the bladder. IMPRESSION: 1. Mild left-sided hydronephrosis noted. 2. Bilateral ureteral stents noted ending at the bladder. Electronically Signed   By: Roanna RaiderJeffery  Chang M.D.   On: 03/16/2016 00:03    IMPRESSION AND PLAN:  This is a 45 y.o. male with a history of Nephrolithiasis with recent lithotripsy and bilateral ureteral stenting now being admitted with: 1. Sepsis secondary to urinary tract infection/pyelonephritis-Will admit for IV antibiotics, IV fluid hydration, renal ultrasound at the recommendation of urology and inpatient neurology consultation. We'll send urine cultures and blood cultures. Pain control and antipyretics.  Otherwise continue home medications.   Diet/Nutrition: Regular Fluids: IV normal saline DVT Px: SCDs and early ambulation Code Status: Full  All the records are reviewed and case discussed with ED provider. Management plans discussed with the patient and/or family who express understanding and agree with plan of care.   TOTAL TIME TAKING CARE OF THIS PATIENT: 60 minutes.   Khiana Camino D.O. on 03/16/2016 at 1:18 AM Between 7am to 6pm - Pager - [phone] After 6pm go to [email or web address] - Social research officer, governmentpassword EPAS [facility] [place] [facility] Office [phone] CC: Primary care physician; Dione Housekeeperlmedo, Mario Ernesto, MD     Note: This dictation was prepared with Dragon dictation along with smaller phrase technology. Any transcriptional errors that result from this process are unintentional.

## 2016-03-16 NOTE — Consult Note (Signed)
Urology Consult  I have been asked to see the patient by Dr. Janit BernScheavitz, for evaluation and management of fevers s/p EWSL, stent placement.  Chief Complaint: fevers  History of Present Illness: [name] is a 45 y.o. year old with a history of nephrolithiasis who presented yesterday evening to the emergency room with fevers to 102. [facility] is well [facility] after presumably passed a small left ureteral stone followed by right ESWL for a large nonobstructing stone which is complicated by steinstrasse. He ultimately returned to the operating room on the following day found to have bilateral ureteral obstruction with multiple right ureteral calculi at high-grade obstruction and a small left ureteral stone with low-grade obstruction for bilateral ureteral stent placement. She was ultimately discharged with plans for outpatient follow-up and ultimately planned return to the operating room to address his calculi.  Yesterday evening, he began feeling overall weak with bilateral flank pain and having fevers to 102. He also had symptoms consistent with possibly UTI versus stent pain with urinary frequency, urgency, dysuria. In the emergency room, his UA was grossly positive including positive for nitrates. He was also febrile to 101 but otherwise hemodynamically stable with a mild leukocytosis.  He denied any chills, headaches, nausea, or vomiting. His pain was able to be easily controlled.  He has complained of some testicular tenderness without swelling.  Follow-up imaging in the form of renal ultrasound shows very subtle left renal pelvic fullness. KUB confirms stents in good position.  He has been admitted to the medicine service for IV antibiotics in the form of Levaquin and Aztreonam.   Past Medical History:  Diagnosis Date  . Acid reflux   . Hematuria   . Kidney stones   . Sleep apnea   . Urolithiasis     Past Surgical History:  Procedure Laterality  Date  . CYSTOSCOPY W/ RETROGRADES Bilateral 03/05/2016   Procedure: CYSTOSCOPY WITH RETROGRADE PYELOGRAM;  Surgeon: Hildred LaserBrian James Budzyn, MD;  Location: [facility] OR[facility];  Service: Urology;  Laterality: Bilateral;  . CYSTOSCOPY WITH STENT PLACEMENT Bilateral 03/05/2016   Procedure: CYSTOSCOPY WITH STENT PLACEMENT;  Surgeon: Hildred LaserBrian James Budzyn, MD;  Location: [facility];  Service: Urology;  Laterality: Bilateral;  . EXTRACORPOREAL SHOCK WAVE LITHOTRIPSY Right 03/03/2016   Procedure: EXTRACORPOREAL SHOCK WAVE LITHOTRIPSY (ESWL);  Surgeon: Vanna ScotlandAshley Eloisa Chokshi, MD;  Location: [facility] OR[facility];  Service: Urology;  Laterality: Right;  . KNEE SURGERY    . LAPAROSCOPIC GASTRIC SLEEVE RESECTION      Home Medications:  Current Meds  Medication Sig  . omeprazole (PRILOSEC) 20 MG capsule Take 20 mg by mouth at bedtime.   Marland Kitchen. oxyCODONE-acetaminophen (ROXICET) 5-325 MG tablet Take 1 tablet by mouth every 4 (four) hours as needed for severe pain.  . tamsulosin (FLOMAX) 0.4 MG CAPS capsule Take 1 capsule (0.4 mg total) by mouth daily after breakfast.    Allergies:  Allergies  Allergen Reactions  . Penicillins Itching and Rash    Has patient had a PCN reaction causing immediate rash, facial/tongue/throat swelling, SOB or lightheadedness with hypotension: No Has patient had a PCN reaction causing severe rash involving mucus membranes or skin necrosis: No Has patient had a PCN reaction that required hospitalization No Has patient had a PCN reaction occurring within the last 10 years: No If all of the above answers are "NO", then may proceed with Cephalosporin use.    Family History  Problem Relation Age of Onset  . Prostate cancer Father   .  Kidney disease Neg Hx   . Kidney cancer Neg Hx     Social History:  reports that he has never smoked. He has never used smokeless tobacco. He reports that he drinks alcohol. He reports that he does not use drugs.  ROS: A complete review of systems was performed.  All systems are negative  except for pertinent findings as noted.  Physical Exam:  Vital signs in last 24 hours: Temp:  [98.2 F (36.8 C)-101.1 F (38.4 C)] 98.2 F (36.8 C) (08/16 1257) Pulse Rate:  [75-94] 75 (08/16 1257) Resp:  [18-20] 18 (08/16 1257) BP: (118-129)/(73-84) 119/74 (08/16 1257) SpO2:  [98 %-100 %] 99 % (08/16 1257) Weight:  [187 lb (84.8 kg)-190 lb 14.4 oz (86.6 kg)] 190 lb 14.4 oz (86.6 kg) (08/16 0029) Constitutional:  Alert and oriented, No acute distress HEENT: Venturia AT, moist mucus membranes.  Trachea midline, no masses Cardiovascular: Regular rate and rhythm, no clubbing, cyanosis, or edema. Respiratory: Normal respiratory effort, lungs clear bilaterally GI: Abdomen is soft, nontender, nondistended, no abdominal masses.  Mild tenderness in the suprapubic area. GU: Mild bilateral CVA tenderness to percussion..  Circumcised phallus. Scrotum unremarkable with bilateral descended testicles. Testicles nontender, not enlarged, no masses. Skin: No rashes, bruises or suspicious lesions Lymph: No cervical or inguinal adenopathy Neurologic: Grossly intact, no focal deficits, moving all 4 extremities Psychiatric: Normal mood and affect   Laboratory Data:   Recent Labs  03/15/16 1835 03/16/16 0043  WBC 11.5* 11.9*  HGB 13.8 12.7*  HCT 41.4 39.0*    Recent Labs  03/15/16 1835 03/16/16 0043  NA 135 134*  K 3.7 3.4*  CL 102 108  CO2 26 21*  GLUCOSE 104* 104*  BUN 11 9  CREATININE 0.99 0.80  CALCIUM 9.2 8.0*   No results for input(s): LABPT, INR in the last 72 hours. No results for input(s): LABURIN in the last 72 hours. Results for orders placed or performed during the hospital encounter of 03/15/16  Blood Culture (routine x 2)     Status: None (Preliminary result)   Collection Time: 03/15/16  9:50 PM  Result Value Ref Range Status   Specimen Description BLOOD RIGHT ASSIST CONTROL  Final   Special Requests BOTTLES DRAWN AEROBIC AND ANAEROBIC 12CC  Final   Culture NO GROWTH < 12  HOURS  Final   Report Status PENDING  Incomplete  Blood Culture (routine x 2)     Status: None (Preliminary result)   Collection Time: 03/15/16  9:50 PM  Result Value Ref Range Status   Specimen Description BLOOD LEFT HAND  Final   Special Requests BOTTLES DRAWN AEROBIC AND ANAEROBIC 7CC  Final   Culture NO GROWTH < 12 HOURS  Final   Report Status PENDING  Incomplete   Component     Latest Ref Rng & Units 03/15/2016  pH     5.0 - 8.0 8.0  Protein     NEGATIVE mg/dL >161>500 (A)  Nitrite     NEGATIVE POSITIVE (A)  Color, Urine     YELLOW YELLOW (A)  Appearance     CLEAR CLOUDY (A)  Glucose     NEGATIVE mg/dL NEGATIVE  Bilirubin Urine     NEGATIVE NEGATIVE  Ketones, ur     NEGATIVE mg/dL NEGATIVE  Specific Gravity, Urine     1.005 - 1.030 1.015  Hgb urine dipstick     NEGATIVE 3+ (A)  Leukocytes, UA     NEGATIVE 3+ (A)  RBC / HPF  0 - 5 RBC/hpf TOO NUMEROUS TO COUNT  WBC, UA     0 - 5 WBC/hpf TOO NUMEROUS TO COUNT  Bacteria, UA     NONE SEEN FEW (A)  Squamous Epithelial / LPF     NONE SEEN NONE SEEN    Radiologic Imaging: Dg Abd 1 View  Result Date: 03/16/2016 CLINICAL DATA:  Kidney stone EXAM: ABDOMEN - 1 VIEW COMPARISON:  CT scan 03/04/2016 FINDINGS: There is normal small bowel gas pattern. Study is limited by moderate gas within small bowel and colon. Bilateral ureteral stent in place. No definite renal calcifications are identified. Question right ureteral calculus at the level of L3-L4 disc space measure about 3 mm. IMPRESSION: Study is limited by moderate gas within small bowel and colon. Bilateral ureteral stent in place. No definite renal calcifications are identified. Question right ureteral calculus at the level of L3-L4 disc space measure about 3 mm. Electronically Signed   By: Natasha Mead M.D.   On: 03/16/2016 10:19   US Renal  Result Date: 03/16/2016 CLINICAL DATA:  Acute onset of fever. Bilateral ureteral stents in place. Initial encounter. EXAM: RENAL /  URINARY TRACT ULTRASOUND COMPLETE COMPARISON:  CT of the abdomen and pelvis performed 03/04/2016 FINDINGS: Right Kidney: Length: 11.3 cm. Echogenicity within normal limits. No mass or hydronephrosis visualized. Left Kidney: Length: 12.7 cm. Echogenicity within normal limits. No mass seen. Mild left-sided hydronephrosis is noted. Bladder: Appears normal for degree of bladder distention. Bilateral ureteral stents are seen ending at the bladder. IMPRESSION: 1. Mild left-sided hydronephrosis noted. 2. Bilateral ureteral stents noted ending at the bladder. Electronically Signed   By: Roanna Raider M.D.   On: 03/16/2016 00:03   Both KUB and renal ultrasound were personally reviewed today.  Impression/Assessment:  45 year old male with bilateral obstructing stone status post bilateral ureteral stent placement admitted with fevers and UA consistent with pyelonephritis.  Renal ultrasound does show some mild left renal pelvic fullness but no severe hydronephrosis and is improved following stent placement. Stents are in good position.  Fullness/mild hydronephrosis likely secondary to vesicoureteral reflux with ureteral stent.  Plan:  1) Bilateral pyelonephritis-  agree with IV antibiotics. Transition to oral by mouth abx for at least 10-14 days following discharge based on culture and sensitivity data which is pending. Natural history of pyelonephritis is for fevers to continuous spike for anywhere up to 72 hours following initiation of antibiotics. If fevers continue after appropriate antibiotics longer than expected, consider cross-sectional imaging in the form of CT abdomen with contrast to rule out renal abscess.  2) Ureteral calculi- outpatient follow-up was artery arranged next week at Dr. Sherryl Barters. We'll go ahead and arrange for bilateral ureteroscopy in the near future after infection is adequately treated. This was discussed with the patient and his sister today at bedside.  3) Mild left hydronephrosis-  as above, likely secondary to reflux and improved since stent placement.  KUB with good stent position.  We will sign off but contact us with questions or concerns.    03/16/2016, 4:15 PM  Vanna Scotland,  MD

## 2016-03-16 NOTE — Progress Notes (Signed)
[facility] [place] at [facility]                                                                                                                                                                                            Patient Demographics   [name], is a 45 y.o. male, DOB - 01-17-1971, ZOX:[id number]  Admit date - 03/15/2016   Admitting Physician Enid Baasadhika Kalisetti, MD  Outpatient Primary MD for the patient is Olmedo, Joycie PeekMario Ernesto, MD   LOS - 1  Subjective:Patient had testicular pain earlier nauseous earlier but now feeling better no chest pain or shortness of breath     Review of Systems:   CONSTITUTIONAL: No documented fever. No fatigue, weakness. No weight gain, no weight loss.  EYES: No blurry or double vision.  ENT: No tinnitus. No postnasal drip. No redness of the oropharynx.  RESPIRATORY: No cough, no wheeze, no hemoptysis. No dyspnea.  CARDIOVASCULAR: No chest pain. No orthopnea. No palpitations. No syncope.  GASTROINTESTINAL: No nausea, no vomiting or diarrhea. No abdominal pain. No melena or hematochezia.  GENITOURINARY: No dysuria or hematuria. Testicular pain positive ENDOCRINE: No polyuria or nocturia. No heat or cold intolerance.  HEMATOLOGY: No anemia. No bruising. No bleeding.  INTEGUMENTARY: No rashes. No lesions.  MUSCULOSKELETAL: No arthritis. No swelling. No gout.  NEUROLOGIC: No numbness, tingling, or ataxia. No seizure-type activity.  PSYCHIATRIC: No anxiety. No insomnia. No ADD.    Vitals:   Vitals:   03/16/16 0005 03/16/16 0029 03/16/16 0303 03/16/16 1257  BP: 128/84 127/79 118/73 119/74  Pulse: 91 86 84 75  Resp: 18 19 18 18   Temp:  99.6 F (37.6 C) 99.3 F (37.4 C) 98.2 F (36.8 C)  TempSrc:  Oral Oral Oral  SpO2: 100% 99% 98% 99%  Weight:  86.6 kg (190 lb 14.4 oz)    Height:  5\' 6"  (1.676 m)      Wt Readings from Last 3 Encounters:  03/16/16 86.6 kg (190 lb 14.4 oz)  03/04/16 85.7 kg (188 lb 14.4 oz)   03/03/16 86.2 kg (190 lb)     Intake/Output Summary (Last 24 hours) at 03/16/16 1437 Last data filed at 03/16/16 1300  Gross per 24 hour  Intake             1665 ml  Output             2150 ml  Net             -485 ml    Physical Exam:   GENERAL: Pleasant-appearing in no apparent distress.  HEAD, EYES, EARS, NOSE AND THROAT:  Atraumatic, normocephalic. Extraocular muscles are intact. Pupils equal and reactive to light. Sclerae anicteric. No conjunctival injection. No oro-pharyngeal erythema.  NECK: Supple. There is no jugular venous distention. No bruits, no lymphadenopathy, no thyromegaly.  HEART: Regular rate and rhythm,. No murmurs, no rubs, no clicks.  LUNGS: Clear to auscultation bilaterally. No rales or rhonchi. No wheezes.  ABDOMEN: Soft, flat, nontender, nondistended. Has good bowel sounds. No hepatosplenomegaly appreciated.  EXTREMITIES: No evidence of any cyanosis, clubbing, or peripheral edema.  +2 pedal and radial pulses bilaterally.  NEUROLOGIC: The patient is alert, awake, and oriented x3 with no focal motor or sensory deficits appreciated bilaterally.  SKIN: Moist and warm with no rashes appreciated.  Psych: Not anxious, depressed LN: No inguinal LN enlargement    Antibiotics   Anti-infectives    Start     Dose/Rate Route Frequency Ordered Stop   03/16/16 2200  levofloxacin (LEVAQUIN) IVPB 750 mg     750 mg 100 mL/hr over 90 Minutes Intravenous Every 24 hours 03/16/16 0025     03/16/16 0600  aztreonam (AZACTAM) 1 g in dextrose 5 % 50 mL IVPB     1 g 100 mL/hr over 30 Minutes Intravenous Every 8 hours 03/16/16 0025     03/15/16 2200  levofloxacin (LEVAQUIN) IVPB 750 mg     750 mg 100 mL/hr over 90 Minutes Intravenous  Once 03/15/16 2152 03/15/16 2332   03/15/16 2200  aztreonam (AZACTAM) 2 g in dextrose 5 % 50 mL IVPB     2 g 100 mL/hr over 30 Minutes Intravenous  Once 03/15/16 2152 03/15/16 2250      Medications   Scheduled Meds: . aztreonam  1 g  Intravenous Q8H  . levofloxacin (LEVAQUIN) IV  750 mg Intravenous Q24H  . pantoprazole  40 mg Oral QHS  . tamsulosin  0.4 mg Oral QPC breakfast   Continuous Infusions: . sodium chloride 100 mL/hr at 03/16/16 1259   PRN Meds:.acetaminophen **OR** acetaminophen, ondansetron **OR** ondansetron (ZOFRAN) IV, oxyCODONE-acetaminophen   Data Review:   Micro Results Recent Results (from the past 240 hour(s))  Blood Culture (routine x 2)     Status: None (Preliminary result)   Collection Time: 03/15/16  9:50 PM  Result Value Ref Range Status   Specimen Description BLOOD RIGHT ASSIST CONTROL  Final   Special Requests BOTTLES DRAWN AEROBIC AND ANAEROBIC 12CC  Final   Culture NO GROWTH < 12 HOURS  Final   Report Status PENDING  Incomplete  Blood Culture (routine x 2)     Status: None (Preliminary result)   Collection Time: 03/15/16  9:50 PM  Result Value Ref Range Status   Specimen Description BLOOD LEFT HAND  Final   Special Requests BOTTLES DRAWN AEROBIC AND ANAEROBIC 7CC  Final   Culture NO GROWTH < 12 HOURS  Final   Report Status PENDING  Incomplete    Radiology Reports Dg Abd 1 View  Result Date: 03/16/2016 CLINICAL DATA:  Kidney stone EXAM: ABDOMEN - 1 VIEW COMPARISON:  CT scan 03/04/2016 FINDINGS: There is normal small bowel gas pattern. Study is limited by moderate gas within small bowel and colon. Bilateral ureteral stent in place. No definite renal calcifications are identified. Question right ureteral calculus at the level of L3-L4 disc space measure about 3 mm. IMPRESSION: Study is limited by moderate gas within small bowel and colon. Bilateral ureteral stent in place. No definite renal calcifications are identified. Question right ureteral calculus at the level of L3-L4 disc space measure about  3 mm. Electronically Signed   By: Natasha Mead M.D.   On: 03/16/2016 10:19   Dg Abd 1 View  Result Date: 03/03/2016 CLINICAL DATA:  Preoperative lithotripsy EXAM: ABDOMEN - 1 VIEW  COMPARISON:  CT abdomen and pelvis February 19, 2016 FINDINGS: There are several clustered calculi in the midportion of the right kidney. These calculi range in size from as small as 2 mm to as large as 10 x 5 mm. There is a probable small phlebolith in the inferior left pelvis. There is moderate stool in the colon. There is no bowel dilatation or air-fluid level suggesting obstruction. No free air. There are surgical clips in the right upper quadrant in the gallbladder fossa region. There are also surgical clips in the left upper abdomen consistent with gastric bypass surgery. IMPRESSION: Multiple right renal calculi in a cluster as noted above. Probable small phlebolith inferior left pelvis. Areas of postoperative change. Bowel gas pattern unremarkable without bowel obstruction or free air evident. Electronically Signed   By: Bretta Bang III M.D.   On: 03/03/2016 08:19   US Renal  Result Date: 03/16/2016 CLINICAL DATA:  Acute onset of fever. Bilateral ureteral stents in place. Initial encounter. EXAM: RENAL / URINARY TRACT ULTRASOUND COMPLETE COMPARISON:  CT of the abdomen and pelvis performed 03/04/2016 FINDINGS: Right Kidney: Length: 11.3 cm. Echogenicity within normal limits. No mass or hydronephrosis visualized. Left Kidney: Length: 12.7 cm. Echogenicity within normal limits. No mass seen. Mild left-sided hydronephrosis is noted. Bladder: Appears normal for degree of bladder distention. Bilateral ureteral stents are seen ending at the bladder. IMPRESSION: 1. Mild left-sided hydronephrosis noted. 2. Bilateral ureteral stents noted ending at the bladder. Electronically Signed   By: Roanna Raider M.D.   On: 03/16/2016 00:03   Ct Renal Stone Study  Result Date: 03/04/2016 CLINICAL DATA:  Right-sided flank pain. Lithotripsy done yesterday. Hematuria. EXAM: CT ABDOMEN AND PELVIS WITHOUT CONTRAST TECHNIQUE: Multidetector CT imaging of the abdomen and pelvis was performed following the standard protocol  without IV contrast. COMPARISON:  February 19, 2016 CT scan and March 03, 2016 KUB FINDINGS: There is mild thickening of the distal esophagus. This was noted to represent a small hiatal hernia on the comparison CT scan. The lung bases are otherwise normal. No free air or free fluid. Multiple clustered stones seen in the lower right kidney. Several tiny stones are seen in the upper pole calices on the right. Several small stones are seen in the right renal pelvis. Two stones are seen in the proximal right ureter best seen on coronal image 37 with the largest measuring 4 mm in transverse dimension. The more inferior of the 2 measures 5.5 mm in cranial caudal dimension. Another stone is seen in the mid right ureter near the pelvic brim measuring nearly 4 mm. There is also a stone in the distal left ureter on axial image 79 measuring 3.5 mm. No other left ureteral stones are identified. There is bilateral hydronephrosis, right greater than left with no significant perinephric stranding on the left but mild perinephric stranding on the right. An exophytic mass off the left kidney has been present since October 2014. It demonstrates an attenuation of 19 Hounsfield units today, likely a hyperdense cyst. Another adjacent cyst is seen in the left kidney. The patient is status post cholecystectomy. The liver, spleen, adrenal glands, and pancreas are normal. Non aneurysmal aorta with minimal atherosclerotic change. No adenopathy. Previous gastric surgery. The small bowel is normal. The colon is normal. No  evidence of appendicitis. The pelvis demonstrates a distal left ureteral stone. No adenopathy or mass. No change in the bones. IMPRESSION: 1. There are 3 stones in the right ureter and 1 stone in the distal left ureter with right greater than left hydronephrosis and right-sided perinephric stranding. This accounts for the patient's symptoms. Multiple other stones are seen in the right kidney as described above. Electronically  Signed   By: Gerome Sam III M.D   On: 03/04/2016 18:29   Ct Renal Stone Study  Result Date: 02/19/2016 CLINICAL DATA:  Left flank pain EXAM: CT ABDOMEN AND PELVIS WITHOUT CONTRAST TECHNIQUE: Multidetector CT imaging of the abdomen and pelvis was performed following the standard protocol without IV contrast. COMPARISON:  February 03, 2016 FINDINGS: There is a hiatal hernia. The lung bases are otherwise normal. No free air or free fluid. Again noted are multiple small stones in the right renal pelvis without active hydronephrosis or obstruction. A few small stones are scattered throughout the right kidney as well. There is a possible subtle stone in the mid left kidney on coronal images. Persistent hydronephrosis on the left, stable in the interval. The proximal left ureter is also prominent. There is a stone in the left ureter on series 2, image 54 measuring 3 mm, in retrospect present previously. There is been slight advancement of the stone down the ureter by only 1 or 2 cm. The ureters are otherwise normal. Multiple cysts and probable hyperdense cysts associated with the left kidney, unchanged. The patient is status post cholecystectomy. The liver, spleen, adrenal glands, and pancreas are normal. Minimal atherosclerotic change in the non aneurysmal aorta. The patient is status post gastric surgery. The small bowel is normal. The colon is normal in appearance. The appendix is not seen but there is no secondary evidence of appendicitis. No adenopathy. The pelvis demonstrates no adenopathy or mass. The bladder is unremarkable. The prostate and seminal vesicles are within normal limits. The visualized bones demonstrate scattered degenerative changes but no other acute bony abnormalities. IMPRESSION: 1. There is a 3 mm stone in the left ureter with left-sided hydronephrosis. In retrospect, the stone was present on the February 03, 2016 study. This stone has only migrated distally 1 or 2 cm in the interval. 2. Stones in  of the right renal pelvis without active obstruction. The stones could certainly cause intermittent obstruction given their location. 3. Mild atherosclerotic change in the abdominal aorta. Electronically Signed   By: Gerome Sam III M.D   On: 02/19/2016 02:02     CBC  Recent Labs Lab 03/15/16 1835 03/16/16 0043  WBC 11.5* 11.9*  HGB 13.8 12.7*  HCT 41.4 39.0*  PLT 339 290  MCV 80.7 81.0  MCH 26.8 26.4  MCHC 33.3 32.6  RDW 12.7 12.8    Chemistries   Recent Labs Lab 03/15/16 1835 03/16/16 0043  NA 135 134*  K 3.7 3.4*  CL 102 108  CO2 26 21*  GLUCOSE 104* 104*  BUN 11 9  CREATININE 0.99 0.80  CALCIUM 9.2 8.0*  AST  --  12*  ALT  --  11*  ALKPHOS  --  54  BILITOT  --  0.9   ------------------------------------------------------------------------------------------------------------------ estimated creatinine clearance is 117.7 mL/min (by C-G formula based on SCr of 0.8 mg/dL). ------------------------------------------------------------------------------------------------------------------ No results for input(s): HGBA1C in the last 72 hours. ------------------------------------------------------------------------------------------------------------------ No results for input(s): CHOL, HDL, LDLCALC, TRIG, CHOLHDL, LDLDIRECT in the last 72 hours. ------------------------------------------------------------------------------------------------------------------ No results for input(s): TSH, T4TOTAL, T3FREE, THYROIDAB  in the last 72 hours.  Invalid input(s): FREET3 ------------------------------------------------------------------------------------------------------------------ No results for input(s): VITAMINB12, FOLATE, FERRITIN, TIBC, IRON, RETICCTPCT in the last 72 hours.  Coagulation profile No results for input(s): INR, PROTIME in the last 168 hours.  No results for input(s): DDIMER in the last 72 hours.  Cardiac Enzymes No results for input(s): CKMB,  TROPONINI, MYOGLOBIN in the last 168 hours.  Invalid input(s): CK ------------------------------------------------------------------------------------------------------------------ Invalid input(s): POCBNP    Assessment & Plan   This is a 45 y.o. male with a history of Nephrolithiasis with recent lithotripsy and bilateral ureteral stenting now being admitted with: 1. Sepsis secondary to urinary tract infection/pyelonephritis- Recent hospitalization for urinary tract infection renal stones requiring stents Patient again has hydronephrosis Will ask urology to evaluate He still has a stent in place  2. GERD continue Protonix  3. Miscellaneous Lovenox for DVT prophylaxis     Code Status Orders        Start     Ordered   03/16/16 0026  Full code  Continuous     03/16/16 0025    Code Status History    Date Active Date Inactive Code Status Order ID Comments User Context   03/04/2016  7:41 PM 03/05/2016  5:02 PM Full Code [id number]  Wyatt Haste, MD ED           Consults  urology   DVT Prophylaxis  Lovenox    Lab Results  Component Value Date   PLT 290 03/16/2016     Time Spent in minutes   Greater than 50% of time spent in care coordination and counseling patient regarding the condition and plan of care.   Auburn Bilberry M.D on 03/16/2016 at 2:37 PM  Between 7am to 6pm - Pager - [phone]  After 6pm go to [email or web address] - password [email or web address] [facility]  [facility]   Office  [phone]

## 2016-03-17 ENCOUNTER — Other Ambulatory Visit: Payer: Self-pay | Admitting: Radiology

## 2016-03-17 DIAGNOSIS — N200 Calculus of kidney: Secondary | ICD-10-CM

## 2016-03-17 MED ORDER — FLUTICASONE PROPIONATE 50 MCG/ACT NA SUSP
1.0000 | Freq: Every day | NASAL | Status: DC
  Administered 2016-03-17 – 2016-03-18 (×2): 1 via NASAL
  Filled 2016-03-17: qty 16

## 2016-03-17 MED ORDER — DEXTROSE 5 % IV SOLN
2.0000 g | INTRAVENOUS | Status: DC
  Administered 2016-03-17: 2 g via INTRAVENOUS
  Filled 2016-03-17 (×2): qty 2

## 2016-03-17 MED ORDER — GENTAMICIN IN SALINE 1.6-0.9 MG/ML-% IV SOLN
80.0000 mg | INTRAVENOUS | Status: DC
  Filled 2016-03-17: qty 50

## 2016-03-17 MED ORDER — POTASSIUM CHLORIDE CRYS ER 20 MEQ PO TBCR
40.0000 meq | EXTENDED_RELEASE_TABLET | Freq: Once | ORAL | Status: AC
  Administered 2016-03-17: 40 meq via ORAL
  Filled 2016-03-17: qty 2

## 2016-03-17 MED ORDER — LEVOFLOXACIN IN D5W 500 MG/100ML IV SOLN
500.0000 mg | INTRAVENOUS | Status: DC
  Filled 2016-03-17: qty 100

## 2016-03-17 MED ORDER — IBUPROFEN 400 MG PO TABS: 400.0000 mg | ORAL_TABLET | Freq: Four times a day (QID) | ORAL | Status: DC | PRN

## 2016-03-17 NOTE — Progress Notes (Signed)
Pharmacy Antibiotic Note  Lucila Maineeter W Yahnke is a 45 y.o. male admitted on 03/15/2016 with UTI.  Pharmacy has been consulted for aztreonam dosing. Patient has history of recent hospital stay for urinary stents and positive fever. Levaquin d/c'd 8/16.  Plan: 1. Aztreonam 2 gm IV x 1 in ED followed by aztreonam 1 gm IV Q8H.    Height: 5\' 6"  (167.6 cm) Weight: 190 lb 14.4 oz (86.6 kg) IBW/kg (Calculated) : 63.8  Temp (24hrs), Avg:98.3 F (36.8 C), Min:97.7 F (36.5 C), Max:98.7 F (37.1 C)   Recent Labs Lab 03/15/16 1835 03/15/16 2150 03/16/16 0043  WBC 11.5*  --  11.9*  CREATININE 0.99  --  0.80  LATICACIDVEN  --  1.3 1.0    Estimated Creatinine Clearance: 117.7 mL/min (by C-G formula based on SCr of 0.8 mg/dL).    Allergies  Allergen Reactions  . Penicillins Itching and Rash    Has patient had a PCN reaction causing immediate rash, facial/tongue/throat swelling, SOB or lightheadedness with hypotension: No Has patient had a PCN reaction causing severe rash involving mucus membranes or skin necrosis: No Has patient had a PCN reaction that required hospitalization No Has patient had a PCN reaction occurring within the last 10 years: No If all of the above answers are "NO", then may proceed with Cephalosporin use.    Microbiology: 8/15 UCx >100k coag neg staph - Paged Dr. Allena KatzPatel, per MD no need to treat coag neg staph in the urine. UCx still pending. Pt is afebrile today.   Thank you for allowing pharmacy to be a part of this patient's care.  Marty HeckWang, Derin Granquist L, Pharm.D., BCPS Clinical Pharmacist 03/17/2016 10:01 AM

## 2016-03-17 NOTE — Progress Notes (Signed)
[facility] - [place] at [facility]                                                                                                                                                                                            Patient Demographics   [name], is a 45 y.o. male, DOB - 1971-06-15, WJX:[id number]  Admit date - 03/15/2016   Admitting Physician Enid Baas, MD  Outpatient Primary MD for the patient is Olmedo, Joycie Peek, MD   LOS - 2  Subjective:Complains of headache No fevers or chills     Review of Systems:   CONSTITUTIONAL: No documented fever. No fatigue, weakness. No weight gain, no weight loss.  EYES: No blurry or double vision.  ENT: No tinnitus. No postnasal drip. No redness of the oropharynx.  RESPIRATORY: No cough, no wheeze, no hemoptysis. No dyspnea.  CARDIOVASCULAR: No chest pain. No orthopnea. No palpitations. No syncope.  GASTROINTESTINAL: No nausea, no vomiting or diarrhea. No abdominal pain. No melena or hematochezia.  GENITOURINARY: No dysuria or hematuria.  ENDOCRINE: No polyuria or nocturia. No heat or cold intolerance.  HEMATOLOGY: No anemia. No bruising. No bleeding.  INTEGUMENTARY: No rashes. No lesions.  MUSCULOSKELETAL: No arthritis. No swelling. No gout.  NEUROLOGIC: No numbness, tingling, or ataxia. No seizure-type activity. Positive headache PSYCHIATRIC: No anxiety. No insomnia. No ADD.    Vitals:   Vitals:   03/16/16 1257 03/16/16 1945 03/17/16 0333 03/17/16 0757  BP: 119/74 112/69 (!) 90/58 105/65  Pulse: 75 85 66 70  Resp: 18 19 18 16   Temp: 98.2 F (36.8 C) 98.5 F (36.9 C) 97.7 F (36.5 C) 98.7 F (37.1 C)  TempSrc: Oral Oral Oral Oral  SpO2: 99% 98% 98% 99%  Weight:      Height:        Wt Readings from Last 3 Encounters:  03/16/16 86.6 kg (190 lb 14.4 oz)  03/04/16 85.7 kg (188 lb 14.4 oz)  03/03/16 86.2 kg (190 lb)     Intake/Output Summary (Last 24 hours) at 03/17/16 1147 Last  data filed at 03/17/16 0943  Gross per 24 hour  Intake          3251.66 ml  Output             2100 ml  Net          1151.66 ml    Physical Exam:   GENERAL: Pleasant-appearing in no apparent distress.  HEAD, EYES, EARS, NOSE AND THROAT: Atraumatic, normocephalic. Extraocular muscles are intact. Pupils equal and reactive to light. Sclerae anicteric. No conjunctival injection. No oro-pharyngeal erythema.  NECK:  Supple. There is no jugular venous distention. No bruits, no lymphadenopathy, no thyromegaly.  HEART: Regular rate and rhythm,. No murmurs, no rubs, no clicks.  LUNGS: Clear to auscultation bilaterally. No rales or rhonchi. No wheezes.  ABDOMEN: Soft, flat, nontender, nondistended. Has good bowel sounds. No hepatosplenomegaly appreciated.  EXTREMITIES: No evidence of any cyanosis, clubbing, or peripheral edema.  +2 pedal and radial pulses bilaterally.  NEUROLOGIC: The patient is alert, awake, and oriented x3 with no focal motor or sensory deficits appreciated bilaterally.  SKIN: Moist and warm with no rashes appreciated.  Psych: Not anxious, depressed LN: No inguinal LN enlargement    Antibiotics   Anti-infectives    Start     Dose/Rate Route Frequency Ordered Stop   03/16/16 2200  levofloxacin (LEVAQUIN) IVPB 750 mg  Status:  Discontinued     750 mg 100 mL/hr over 90 Minutes Intravenous Every 24 hours 03/16/16 0025 03/16/16 1439   03/16/16 0600  aztreonam (AZACTAM) 1 g in dextrose 5 % 50 mL IVPB     1 g 100 mL/hr over 30 Minutes Intravenous Every 8 hours 03/16/16 0025     03/15/16 2200  levofloxacin (LEVAQUIN) IVPB 750 mg     750 mg 100 mL/hr over 90 Minutes Intravenous  Once 03/15/16 2152 03/15/16 2332   03/15/16 2200  aztreonam (AZACTAM) 2 g in dextrose 5 % 50 mL IVPB     2 g 100 mL/hr over 30 Minutes Intravenous  Once 03/15/16 2152 03/15/16 2250      Medications   Scheduled Meds: . aztreonam  1 g Intravenous Q8H  . pantoprazole  40 mg Oral QHS  . potassium  chloride  40 mEq Oral Once  . tamsulosin  0.4 mg Oral QPC breakfast   Continuous Infusions: . sodium chloride 100 mL/hr at 03/17/16 0100   PRN Meds:.acetaminophen **OR** acetaminophen, ondansetron **OR** ondansetron (ZOFRAN) IV, oxyCODONE-acetaminophen   Data Review:   Micro Results Recent Results (from the past 240 hour(s))  Urine C&S     Status: Abnormal (Preliminary result)   Collection Time: 03/15/16  6:35 PM  Result Value Ref Range Status   Specimen Description URINE, RANDOM  Final   Special Requests NONE  Final   Culture (A)  Final    >=100,000 COLONIES/mL STAPHYLOCOCCUS SPECIES (COAGULASE NEGATIVE)   Report Status PENDING  Incomplete  Blood Culture (routine x 2)     Status: None (Preliminary result)   Collection Time: 03/15/16  9:50 PM  Result Value Ref Range Status   Specimen Description BLOOD RIGHT ASSIST CONTROL  Final   Special Requests BOTTLES DRAWN AEROBIC AND ANAEROBIC 12CC  Final   Culture NO GROWTH 1 DAY  Final   Report Status PENDING  Incomplete  Blood Culture (routine x 2)     Status: None (Preliminary result)   Collection Time: 03/15/16  9:50 PM  Result Value Ref Range Status   Specimen Description BLOOD LEFT HAND  Final   Special Requests BOTTLES DRAWN AEROBIC AND ANAEROBIC 7CC  Final   Culture NO GROWTH 1 DAY  Final   Report Status PENDING  Incomplete    Radiology Reports Dg Abd 1 View  Result Date: 03/16/2016 CLINICAL DATA:  Kidney stone EXAM: ABDOMEN - 1 VIEW COMPARISON:  CT scan 03/04/2016 FINDINGS: There is normal small bowel gas pattern. Study is limited by moderate gas within small bowel and colon. Bilateral ureteral stent in place. No definite renal calcifications are identified. Question right ureteral calculus at the level of L3-L4 disc  space measure about 3 mm. IMPRESSION: Study is limited by moderate gas within small bowel and colon. Bilateral ureteral stent in place. No definite renal calcifications are identified. Question right ureteral  calculus at the level of L3-L4 disc space measure about 3 mm. Electronically Signed   By: Natasha Mead M.D.   On: 03/16/2016 10:19   Dg Abd 1 View  Result Date: 03/03/2016 CLINICAL DATA:  Preoperative lithotripsy EXAM: ABDOMEN - 1 VIEW COMPARISON:  CT abdomen and pelvis February 19, 2016 FINDINGS: There are several clustered calculi in the midportion of the right kidney. These calculi range in size from as small as 2 mm to as large as 10 x 5 mm. There is a probable small phlebolith in the inferior left pelvis. There is moderate stool in the colon. There is no bowel dilatation or air-fluid level suggesting obstruction. No free air. There are surgical clips in the right upper quadrant in the gallbladder fossa region. There are also surgical clips in the left upper abdomen consistent with gastric bypass surgery. IMPRESSION: Multiple right renal calculi in a cluster as noted above. Probable small phlebolith inferior left pelvis. Areas of postoperative change. Bowel gas pattern unremarkable without bowel obstruction or free air evident. Electronically Signed   By: Bretta Bang III M.D.   On: 03/03/2016 08:19   US Renal  Result Date: 03/16/2016 CLINICAL DATA:  Acute onset of fever. Bilateral ureteral stents in place. Initial encounter. EXAM: RENAL / URINARY TRACT ULTRASOUND COMPLETE COMPARISON:  CT of the abdomen and pelvis performed 03/04/2016 FINDINGS: Right Kidney: Length: 11.3 cm. Echogenicity within normal limits. No mass or hydronephrosis visualized. Left Kidney: Length: 12.7 cm. Echogenicity within normal limits. No mass seen. Mild left-sided hydronephrosis is noted. Bladder: Appears normal for degree of bladder distention. Bilateral ureteral stents are seen ending at the bladder. IMPRESSION: 1. Mild left-sided hydronephrosis noted. 2. Bilateral ureteral stents noted ending at the bladder. Electronically Signed   By: Roanna Raider M.D.   On: 03/16/2016 00:03   Ct Renal Stone Study  Result Date:  03/04/2016 CLINICAL DATA:  Right-sided flank pain. Lithotripsy done yesterday. Hematuria. EXAM: CT ABDOMEN AND PELVIS WITHOUT CONTRAST TECHNIQUE: Multidetector CT imaging of the abdomen and pelvis was performed following the standard protocol without IV contrast. COMPARISON:  February 19, 2016 CT scan and March 03, 2016 KUB FINDINGS: There is mild thickening of the distal esophagus. This was noted to represent a small hiatal hernia on the comparison CT scan. The lung bases are otherwise normal. No free air or free fluid. Multiple clustered stones seen in the lower right kidney. Several tiny stones are seen in the upper pole calices on the right. Several small stones are seen in the right renal pelvis. Two stones are seen in the proximal right ureter best seen on coronal image 37 with the largest measuring 4 mm in transverse dimension. The more inferior of the 2 measures 5.5 mm in cranial caudal dimension. Another stone is seen in the mid right ureter near the pelvic brim measuring nearly 4 mm. There is also a stone in the distal left ureter on axial image 79 measuring 3.5 mm. No other left ureteral stones are identified. There is bilateral hydronephrosis, right greater than left with no significant perinephric stranding on the left but mild perinephric stranding on the right. An exophytic mass off the left kidney has been present since October 2014. It demonstrates an attenuation of 19 Hounsfield units today, likely a hyperdense cyst. Another adjacent cyst is seen  in the left kidney. The patient is status post cholecystectomy. The liver, spleen, adrenal glands, and pancreas are normal. Non aneurysmal aorta with minimal atherosclerotic change. No adenopathy. Previous gastric surgery. The small bowel is normal. The colon is normal. No evidence of appendicitis. The pelvis demonstrates a distal left ureteral stone. No adenopathy or mass. No change in the bones. IMPRESSION: 1. There are 3 stones in the right ureter and 1  stone in the distal left ureter with right greater than left hydronephrosis and right-sided perinephric stranding. This accounts for the patient's symptoms. Multiple other stones are seen in the right kidney as described above. Electronically Signed   By: Gerome Samavid  Williams III M.D   On: 03/04/2016 18:29   Ct Renal Stone Study  Result Date: 02/19/2016 CLINICAL DATA:  Left flank pain EXAM: CT ABDOMEN AND PELVIS WITHOUT CONTRAST TECHNIQUE: Multidetector CT imaging of the abdomen and pelvis was performed following the standard protocol without IV contrast. COMPARISON:  February 03, 2016 FINDINGS: There is a hiatal hernia. The lung bases are otherwise normal. No free air or free fluid. Again noted are multiple small stones in the right renal pelvis without active hydronephrosis or obstruction. A few small stones are scattered throughout the right kidney as well. There is a possible subtle stone in the mid left kidney on coronal images. Persistent hydronephrosis on the left, stable in the interval. The proximal left ureter is also prominent. There is a stone in the left ureter on series 2, image 54 measuring 3 mm, in retrospect present previously. There is been slight advancement of the stone down the ureter by only 1 or 2 cm. The ureters are otherwise normal. Multiple cysts and probable hyperdense cysts associated with the left kidney, unchanged. The patient is status post cholecystectomy. The liver, spleen, adrenal glands, and pancreas are normal. Minimal atherosclerotic change in the non aneurysmal aorta. The patient is status post gastric surgery. The small bowel is normal. The colon is normal in appearance. The appendix is not seen but there is no secondary evidence of appendicitis. No adenopathy. The pelvis demonstrates no adenopathy or mass. The bladder is unremarkable. The prostate and seminal vesicles are within normal limits. The visualized bones demonstrate scattered degenerative changes but no other acute bony  abnormalities. IMPRESSION: 1. There is a 3 mm stone in the left ureter with left-sided hydronephrosis. In retrospect, the stone was present on the February 03, 2016 study. This stone has only migrated distally 1 or 2 cm in the interval. 2. Stones in of the right renal pelvis without active obstruction. The stones could certainly cause intermittent obstruction given their location. 3. Mild atherosclerotic change in the abdominal aorta. Electronically Signed   By: Gerome Samavid  Williams III M.D   On: 02/19/2016 02:02     CBC  Recent Labs Lab 03/15/16 1835 03/16/16 0043  WBC 11.5* 11.9*  HGB 13.8 12.7*  HCT 41.4 39.0*  PLT 339 290  MCV 80.7 81.0  MCH 26.8 26.4  MCHC 33.3 32.6  RDW 12.7 12.8    Chemistries   Recent Labs Lab 03/15/16 1835 03/16/16 0043  NA 135 134*  K 3.7 3.4*  CL 102 108  CO2 26 21*  GLUCOSE 104* 104*  BUN 11 9  CREATININE 0.99 0.80  CALCIUM 9.2 8.0*  AST  --  12*  ALT  --  11*  ALKPHOS  --  54  BILITOT  --  0.9   ------------------------------------------------------------------------------------------------------------------ estimated creatinine clearance is 117.7 mL/min (by C-G formula based  on SCr of 0.8 mg/dL). ------------------------------------------------------------------------------------------------------------------ No results for input(s): HGBA1C in the last 72 hours. ------------------------------------------------------------------------------------------------------------------ No results for input(s): CHOL, HDL, LDLCALC, TRIG, CHOLHDL, LDLDIRECT in the last 72 hours. ------------------------------------------------------------------------------------------------------------------ No results for input(s): TSH, T4TOTAL, T3FREE, THYROIDAB in the last 72 hours.  Invalid input(s): FREET3 ------------------------------------------------------------------------------------------------------------------ No results for input(s): VITAMINB12, FOLATE, FERRITIN,  TIBC, IRON, RETICCTPCT in the last 72 hours.  Coagulation profile No results for input(s): INR, PROTIME in the last 168 hours.  No results for input(s): DDIMER in the last 72 hours.  Cardiac Enzymes No results for input(s): CKMB, TROPONINI, MYOGLOBIN in the last 168 hours.  Invalid input(s): CK ------------------------------------------------------------------------------------------------------------------ Invalid input(s): POCBNP    Assessment & Plan   This is a 46 y.o. male with a history of Nephrolithiasis with recent lithotripsy and bilateral ureteral stenting now being admitted with: 1. Sepsis secondary to urinary tract infection/pyelonephritis- Recent hospitalization for urinary tract infection renal stones requiring stents Patient again has hydronephrosis Appreciate urology input He will have a cystoscopy next week Urine cultures showing coag-negative staph continue meropenem for 14 day will switch him to oral tomorrow and hopefully discharge home  2. GERD continue Protonix  3. Headache patient describes some sinus difficulties also start him on Flonase     Code Status Orders        Start     Ordered   03/16/16 0026  Full code  Continuous     03/16/16 0025    Code Status History    Date Active Date Inactive Code Status Order ID Comments User Context   03/04/2016  7:41 PM 03/05/2016  5:02 PM Full Code [id number]  Wyatt Haste, MD ED           Consults  urology   DVT Prophylaxis  Lovenox    Lab Results  Component Value Date   PLT 290 03/16/2016     Time Spent in minutes   Greater than 50% of time spent in care coordination and counseling patient regarding the condition and plan of care.   Auburn Bilberry M.D on 03/17/2016 at 11:47 AM  Between 7am to 6pm - Pager - [phone]  After 6pm go to [email or web address] - password [email or web address] [facility]  [facility] [facility]   Office  [phone]

## 2016-03-17 NOTE — Care Management (Signed)
Readmit with sepsis due to uti.  Had lithotripsy and urethral stents approx 10 days ago.  Completed round of antibiotics.  At present have not identified any discharge needs.  Receiving IV antibiotics and IVF

## 2016-03-17 NOTE — Progress Notes (Signed)
Patient receiving fluids, sepsis r/t uti, current bilateral urethal stents placed r/t kidney stones. ABT contininues that changed to rocephin, observing for any reactions. None at this time. Orders placed for cytoscopy to be performed on 29th. Patient A&Ox4, complaints of HA with percocet given with verbal relief, no further complaints this shift.

## 2016-03-18 LAB — CBC
HEMATOCRIT: 36.9 % — AB (ref 40.0–52.0)
Hemoglobin: 12.3 g/dL — ABNORMAL LOW (ref 13.0–18.0)
MCH: 27.2 pg (ref 26.0–34.0)
MCHC: 33.3 g/dL (ref 32.0–36.0)
MCV: 81.5 fL (ref 80.0–100.0)
Platelets: 291 10*3/uL (ref 150–440)
RBC: 4.53 MIL/uL (ref 4.40–5.90)
RDW: 13.1 % (ref 11.5–14.5)
WBC: 7.9 10*3/uL (ref 3.8–10.6)

## 2016-03-18 LAB — URINE CULTURE

## 2016-03-18 MED ORDER — CEFUROXIME AXETIL 500 MG PO TABS: 500.0000 mg | ORAL_TABLET | Freq: Two times a day (BID) | ORAL | 0 refills | Status: DC

## 2016-03-18 NOTE — Care Management Note (Signed)
Case Management Note  Patient Details  Name: [name] MRN: [id number] Date of Birth: 1971-03-24  Subjective/Objective:  Spoke with patient for discharge planning He is alert and oriented from home and  Independent. No CM needs Identified                 Action/Plan: Discharge to home with self care.   Expected Discharge Date:  03/18/16               Expected Discharge Plan:     In-House Referral:     Discharge planning Services     Post Acute Care Choice:    Choice offered to:     DME Arranged:    DME Agency:     HH Arranged:    HH Agency:     Status of Service:  Completed, signed off  If discussed at [employer] Length of Stay Meetings, dates discussed:    Additional Comments:  Adonis HugueninBerkhead, Viraaj Vorndran L, RN 03/18/2016, 11:15 AM

## 2016-03-18 NOTE — Progress Notes (Signed)
Patient d/c with no distress, wheeled downstairs by me. No ss of distress noted, vs wnl before d/c.

## 2016-03-18 NOTE — Progress Notes (Signed)
[facility] [place] at [facility]        [name] was admitted to the Hospital on 03/15/2016 and Discharged  03/18/2016 and should be excused from work/school   for 4 days starting 03/15/2016 , may return to work/school without any restrictions.  Call Auburn BilberryShreyang Fatina Sprankle MD with questions.  Auburn BilberryPATEL, Latanya Hemmer M.D on 03/18/2016,at 10:39 AM  [facility] [place] [facility]    Office  [phone]

## 2016-03-18 NOTE — Discharge Summary (Signed)
Alex MainePeter W Garcia, 45 y.o., DOB August 29, 1970, MRN [id number]. Admission date: 03/15/2016 Discharge Date 03/18/2016 Primary MD [name], Alex Ernesto, MD Admitting Physician Alex Baasadhika Kalisetti, MD  Admission Diagnosis  Pyelonephritis [N12] Sepsis, due to unspecified organism [facility](HCC) [A41.9]  Discharge Diagnosis   Active Problems:   Sepsis secondary to UTI [facility](HCC)   Hydronephrosis   Recent ureteral stent   GERD        Hospital Course [name] is a 45 y.o. male with a known history of nephrolithiasis status post ureteral stenting was in a usual state of health until day of admission when he developed a fever to 102 at home associated with low back pain, abdominal pain, myalgia and fatigue. Patient states that he was hospitalized with lithotripsy and bilateral ureteral stenting. Patient was admitted to the hospital started on broad-spectrum antibiotics his urine culture showed coag-negative staph. Blood cultures remain negative. Patient was seen by urology the recommended follow-up patient is scheduled to have another procedure next week. At this point he is not having any fevers no chills he will be discharged on oral anabiotic's.           Consults  urology  Significant Tests:  See full reports for all details    Dg Abd 1 View  Result Date: 03/16/2016 CLINICAL DATA:  Kidney stone EXAM: ABDOMEN - 1 VIEW COMPARISON:  CT scan 03/04/2016 FINDINGS: There is normal small bowel gas pattern. Study is limited by moderate gas within small bowel and colon. Bilateral ureteral stent in place. No definite renal calcifications are identified. Question right ureteral calculus at the level of L3-L4 disc space measure about 3 mm. IMPRESSION: Study is limited by moderate gas within small bowel and colon. Bilateral ureteral stent in place. No definite renal calcifications are identified. Question right ureteral calculus at the level of L3-L4 disc space measure about 3 mm. Electronically Signed   By: Alex MeadLiviu   Garcia M.D.   On: 03/16/2016 10:19   Dg Abd 1 View  Result Date: 03/03/2016 CLINICAL DATA:  Preoperative lithotripsy EXAM: ABDOMEN - 1 VIEW COMPARISON:  CT abdomen and pelvis February 19, 2016 FINDINGS: There are several clustered calculi in the midportion of the right kidney. These calculi range in size from as small as 2 mm to as large as 10 x 5 mm. There is a probable small phlebolith in the inferior left pelvis. There is moderate stool in the colon. There is no bowel dilatation or air-fluid level suggesting obstruction. No free air. There are surgical clips in the right upper quadrant in the gallbladder fossa region. There are also surgical clips in the left upper abdomen consistent with gastric bypass surgery. IMPRESSION: Multiple right renal calculi in a cluster as noted above. Probable small phlebolith inferior left pelvis. Areas of postoperative change. Bowel gas pattern unremarkable without bowel obstruction or free air evident. Electronically Signed   By: Alex BangWilliam  Woodruff Garcia M.D.   On: 03/03/2016 08:19   [place] Renal  Result Date: 03/16/2016 CLINICAL DATA:  Acute onset of fever. Bilateral ureteral stents in place. Initial encounter. EXAM: RENAL / URINARY TRACT ULTRASOUND COMPLETE COMPARISON:  CT of the abdomen and pelvis performed 03/04/2016 FINDINGS: Right Kidney: Length: 11.3 cm. Echogenicity within normal limits. No mass or hydronephrosis visualized. Left Kidney: Length: 12.7 cm. Echogenicity within normal limits. No mass seen. Mild left-sided hydronephrosis is noted. Bladder: Appears normal for degree of bladder distention. Bilateral ureteral stents are seen ending at the bladder. IMPRESSION: 1. Mild left-sided hydronephrosis noted. 2. Bilateral ureteral stents  noted ending at the bladder. Electronically Signed   By: [name] M.D.   On: 03/16/2016 00:03   Ct Renal Stone Study  Result Date: 03/04/2016 CLINICAL DATA:  Right-sided flank pain. Lithotripsy done yesterday. Hematuria. EXAM: CT ABDOMEN  AND PELVIS WITHOUT CONTRAST TECHNIQUE: Multidetector CT imaging of the abdomen and pelvis was performed following the standard protocol without IV contrast. COMPARISON:  February 19, 2016 CT scan and March 03, 2016 KUB FINDINGS: There is mild thickening of the distal esophagus. This was noted to represent a small hiatal hernia on the comparison CT scan. The lung bases are otherwise normal. No free air or free fluid. Multiple clustered stones seen in the lower right kidney. Several tiny stones are seen in the upper pole calices on the right. Several small stones are seen in the right renal pelvis. Two stones are seen in the proximal right ureter best seen on coronal image 37 with the largest measuring 4 mm in transverse dimension. The more inferior of the 2 measures 5.5 mm in cranial caudal dimension. Another stone is seen in the mid right ureter near the pelvic brim measuring nearly 4 mm. There is also a stone in the distal left ureter on axial image 79 measuring 3.5 mm. No other left ureteral stones are identified. There is bilateral hydronephrosis, right greater than left with no significant perinephric stranding on the left but mild perinephric stranding on the right. An exophytic mass off the left kidney has been present since October 2014. It demonstrates an attenuation of 19 Hounsfield units today, likely a hyperdense cyst. Another adjacent cyst is seen in the left kidney. The patient is status post cholecystectomy. The liver, spleen, adrenal glands, and pancreas are normal. Non aneurysmal aorta with minimal atherosclerotic change. No adenopathy. Previous gastric surgery. The small bowel is normal. The colon is normal. No evidence of appendicitis. The pelvis demonstrates a distal left ureteral stone. No adenopathy or mass. No change in the bones. IMPRESSION: 1. There are 3 stones in the right ureter and 1 stone in the distal left ureter with right greater than left hydronephrosis and right-sided perinephric  stranding. This accounts for the patient's symptoms. Multiple other stones are seen in the right kidney as described above. Electronically Signed   By: Gerome Sam Garcia M.D   On: 03/04/2016 18:29   Ct Renal Stone Study  Result Date: 02/19/2016 CLINICAL DATA:  Left flank pain EXAM: CT ABDOMEN AND PELVIS WITHOUT CONTRAST TECHNIQUE: Multidetector CT imaging of the abdomen and pelvis was performed following the standard protocol without IV contrast. COMPARISON:  February 03, 2016 FINDINGS: There is a hiatal hernia. The lung bases are otherwise normal. No free air or free fluid. Again noted are multiple small stones in the right renal pelvis without active hydronephrosis or obstruction. A few small stones are scattered throughout the right kidney as well. There is a possible subtle stone in the mid left kidney on coronal images. Persistent hydronephrosis on the left, stable in the interval. The proximal left ureter is also prominent. There is a stone in the left ureter on series 2, image 54 measuring 3 mm, in retrospect present previously. There is been slight advancement of the stone down the ureter by only 1 or 2 cm. The ureters are otherwise normal. Multiple cysts and probable hyperdense cysts associated with the left kidney, unchanged. The patient is status post cholecystectomy. The liver, spleen, adrenal glands, and pancreas are normal. Minimal atherosclerotic change in the non aneurysmal aorta. The patient  is status post gastric surgery. The small bowel is normal. The colon is normal in appearance. The appendix is not seen but there is no secondary evidence of appendicitis. No adenopathy. The pelvis demonstrates no adenopathy or mass. The bladder is unremarkable. The prostate and seminal vesicles are within normal limits. The visualized bones demonstrate scattered degenerative changes but no other acute bony abnormalities. IMPRESSION: 1. There is a 3 mm stone in the left ureter with left-sided hydronephrosis. In  retrospect, the stone was present on the February 03, 2016 study. This stone has only migrated distally 1 or 2 cm in the interval. 2. Stones in of the right renal pelvis without active obstruction. The stones could certainly cause intermittent obstruction given their location. 3. Mild atherosclerotic change in the abdominal aorta. Electronically Signed   By: Gerome Sam Garcia M.D   On: 02/19/2016 02:02       Today   Subjective:   [name]  feeling better denies any chest pain or shortness of breath  Objective:   Blood pressure 118/70, pulse 74, temperature 98.3 F (36.8 C), temperature source Oral, resp. rate 16, height 5\' 6"  (1.676 m), weight 86.6 kg (190 lb 14.4 oz), SpO2 98 %.  .  Intake/Output Summary (Last 24 hours) at 03/18/16 1206 Last data filed at 03/18/16 1040  Gross per 24 hour  Intake              740 ml  Output             2975 ml  Net            -2235 ml    Exam VITAL SIGNS: Blood pressure 118/70, pulse 74, temperature 98.3 F (36.8 C), temperature source Oral, resp. rate 16, height 5\' 6"  (1.676 m), weight 86.6 kg (190 lb 14.4 oz), SpO2 98 %.  GENERAL:  44 y.o.-year-old patient lying in the bed with no acute distress.  EYES: Pupils equal, round, reactive to light and accommodation. No scleral icterus. Extraocular muscles intact.  HEENT: Head atraumatic, normocephalic. Oropharynx and nasopharynx clear.  NECK:  Supple, no jugular venous distention. No thyroid enlargement, no tenderness.  LUNGS: Normal breath sounds bilaterally, no wheezing, rales,rhonchi or crepitation. No use of accessory muscles of respiration.  CARDIOVASCULAR: S1, S2 normal. No murmurs, rubs, or gallops.  ABDOMEN: Soft, nontender, nondistended. Bowel sounds present. No organomegaly or mass.  EXTREMITIES: No pedal edema, cyanosis, or clubbing.  NEUROLOGIC: Cranial nerves II through XII are intact. Muscle strength 5/5 in all extremities. Sensation intact. Gait not checked.  PSYCHIATRIC: The patient  is alert and oriented x 3.  SKIN: No obvious rash, lesion, or ulcer.   Data Review     CBC w Diff: Lab Results  Component Value Date   WBC 7.9 03/18/2016   HGB 12.3 (L) 03/18/2016   HGB 16.0 05/23/2013   HCT 36.9 (L) 03/18/2016   HCT 48.8 05/23/2013   PLT 291 03/18/2016   PLT 410 05/23/2013   LYMPHOPCT 37.1 05/23/2013   MONOPCT 8.6 05/23/2013   EOSPCT 2.5 05/23/2013   BASOPCT 1.1 05/23/2013   CMP: Lab Results  Component Value Date   NA 134 (L) 03/16/2016   NA 139 05/23/2013   K 3.4 (L) 03/16/2016   K 3.5 05/23/2013   CL 108 03/16/2016   CL 106 05/23/2013   CO2 21 (L) 03/16/2016   CO2 21 05/23/2013   BUN 9 03/16/2016   BUN 14 01/20/2016   BUN 22 (H) 05/23/2013   CREATININE  0.80 03/16/2016   CREATININE 1.07 05/23/2013   PROT 6.0 (L) 03/16/2016   PROT 7.8 05/23/2013   ALBUMIN 3.1 (L) 03/16/2016   ALBUMIN 4.1 05/23/2013   BILITOT 0.9 03/16/2016   BILITOT 0.4 05/23/2013   ALKPHOS 54 03/16/2016   ALKPHOS 96 05/23/2013   AST 12 (L) 03/16/2016   AST 21 05/23/2013   ALT 11 (L) 03/16/2016   ALT 49 05/23/2013  .  Micro Results Recent Results (from the past 240 hour(s))  Urine C&S     Status: Abnormal   Collection Time: 03/15/16  6:35 PM  Result Value Ref Range Status   Specimen Description URINE, RANDOM  Final   Special Requests NONE  Final   Culture (A)  Final    >=100,000 COLONIES/mL STAPHYLOCOCCUS SPECIES (COAGULASE NEGATIVE)   Report Status 03/18/2016 FINAL  Final   Organism ID, Bacteria STAPHYLOCOCCUS SPECIES (COAGULASE NEGATIVE) (A)  Final      Susceptibility   Staphylococcus species (coagulase negative) - MIC*    CIPROFLOXACIN >=8 RESISTANT Resistant     GENTAMICIN <=0.5 SENSITIVE Sensitive     NITROFURANTOIN <=16 SENSITIVE Sensitive     OXACILLIN Value in next row Sensitive      <=0.25 SENSITIVEWARNING: For oxacillin-resistant S.aureus and coagulase-negative staphylococci (MRS), other beta-lactam agents, ie, penicillins, beta-lactam/beta-lactamase  inhibitor combinations, cephems (with the exception of the cephalosporins with anti-MRSA activity), and carbapenems, may appear active in vitro, but are not effective clinically.  --CLSI, Vol.32 No.3, January 2012, pg 70.    TETRACYCLINE Value in next row Sensitive      <=0.25 SENSITIVEWARNING: For oxacillin-resistant S.aureus and coagulase-negative staphylococci (MRS), other beta-lactam agents, ie, penicillins, beta-lactam/beta-lactamase inhibitor combinations, cephems (with the exception of the cephalosporins with anti-MRSA activity), and carbapenems, may appear active in vitro, but are not effective clinically.  --CLSI, Vol.32 No.3, January 2012, pg 70.    VANCOMYCIN Value in next row Sensitive      <=0.25 SENSITIVEWARNING: For oxacillin-resistant S.aureus and coagulase-negative staphylococci (MRS), other beta-lactam agents, ie, penicillins, beta-lactam/beta-lactamase inhibitor combinations, cephems (with the exception of the cephalosporins with anti-MRSA activity), and carbapenems, may appear active in vitro, but are not effective clinically.  --CLSI, Vol.32 No.3, January 2012, pg 70.    TRIMETH/SULFA Value in next row Resistant      <=0.25 SENSITIVEWARNING: For oxacillin-resistant S.aureus and coagulase-negative staphylococci (MRS), other beta-lactam agents, ie, penicillins, beta-lactam/beta-lactamase inhibitor combinations, cephems (with the exception of the cephalosporins with anti-MRSA activity), and carbapenems, may appear active in vitro, but are not effective clinically.  --CLSI, Vol.32 No.3, January 2012, pg 70.    CLINDAMYCIN Value in next row Sensitive      <=0.25 SENSITIVEWARNING: For oxacillin-resistant S.aureus and coagulase-negative staphylococci (MRS), other beta-lactam agents, ie, penicillins, beta-lactam/beta-lactamase inhibitor combinations, cephems (with the exception of the cephalosporins with anti-MRSA activity), and carbapenems, may appear active in vitro, but are not effective  clinically.  --CLSI, Vol.32 No.3, January 2012, pg 70.    RIFAMPIN Value in next row Sensitive      <=0.25 SENSITIVEWARNING: For oxacillin-resistant S.aureus and coagulase-negative staphylococci (MRS), other beta-lactam agents, ie, penicillins, beta-lactam/beta-lactamase inhibitor combinations, cephems (with the exception of the cephalosporins with anti-MRSA activity), and carbapenems, may appear active in vitro, but are not effective clinically.  --CLSI, Vol.32 No.3, January 2012, pg 70.    * >=100,000 COLONIES/mL STAPHYLOCOCCUS SPECIES (COAGULASE NEGATIVE)  Blood Culture (routine x 2)     Status: None (Preliminary result)   Collection Time: 03/15/16  9:50 PM  Result Value Ref Range Status  Specimen Description BLOOD RIGHT ASSIST CONTROL  Final   Special Requests BOTTLES DRAWN AEROBIC AND ANAEROBIC 12CC  Final   Culture NO GROWTH 2 DAYS  Final   Report Status PENDING  Incomplete  Blood Culture (routine x 2)     Status: None (Preliminary result)   Collection Time: 03/15/16  9:50 PM  Result Value Ref Range Status   Specimen Description BLOOD LEFT HAND  Final   Special Requests BOTTLES DRAWN AEROBIC AND ANAEROBIC 7CC  Final   Culture NO GROWTH 2 DAYS  Final   Report Status PENDING  Incomplete        Code Status Orders        Start     Ordered   03/16/16 0026  Full code  Continuous     03/16/16 0025    Code Status History    Date Active Date Inactive Code Status Order ID Comments User Context   03/04/2016  7:41 PM 03/05/2016  5:02 PM Full Code [id number]  Wyatt Haste, MD ED          Follow-up Information    urology Follow up in 6 day(s).   Why:  next week has appointment          Discharge Medications     Medication List    STOP taking these medications   ciprofloxacin 500 MG tablet Commonly known as:  CIPRO     TAKE these medications   cefUROXime 500 MG tablet Commonly known as:  CEFTIN Take 1 tablet (500 mg total) by mouth 2 (two) times daily with a meal.    omeprazole 20 MG capsule Commonly known as:  PRILOSEC Take 20 mg by mouth at bedtime.   ondansetron 4 MG disintegrating tablet Commonly known as:  ZOFRAN ODT Take 1 tablet (4 mg total) by mouth every 8 (eight) hours as needed for nausea or vomiting.   oxyCODONE-acetaminophen 5-325 MG tablet Commonly known as:  ROXICET Take 1 tablet by mouth every 4 (four) hours as needed for severe pain.   tamsulosin 0.4 MG Caps capsule Commonly known as:  FLOMAX Take 1 capsule (0.4 mg total) by mouth daily after breakfast.          Total Time in preparing paper work, data evaluation and todays exam - 35 minutes  Auburn Bilberry M.D on 03/18/2016 at 12:06 PM  [facility]   Office  [phone]

## 2016-03-18 NOTE — Progress Notes (Signed)
Reviewed discharge instructions with pt before leaving, paperwork given to pt, Rx verified to [facility] pharmacy. No concerns at this time, d/c vs wnl. IVs removed, no bleeding noted.

## 2016-03-18 NOTE — Discharge Instructions (Signed)
°  DIET:  °Regular diet ° °DISCHARGE CONDITION:  °Good ° °ACTIVITY:  °Activity as tolerated ° °OXYGEN:  °Home Oxygen: No. °  °Oxygen Delivery: room air ° °DISCHARGE LOCATION:  °home  ° ° °ADDITIONAL DISCHARGE INSTRUCTION: ° ° °If you experience worsening of your admission symptoms, develop shortness of breath, life threatening emergency, suicidal or homicidal thoughts you must seek medical attention immediately by calling 911 or calling your MD immediately  if symptoms less severe. ° °You Must read complete instructions/literature along with all the possible adverse reactions/side effects for all the Medicines you take and that have been prescribed to you. Take any new Medicines after you have completely understood and accpet all the possible adverse reactions/side effects.  ° °Please note ° °You were cared for by a hospitalist during your hospital stay. If you have any questions about your discharge medications or the care you received while you were in the hospital after you are discharged, you can call the unit and asked to speak with the hospitalist on call if the hospitalist that took care of you is not available. Once you are discharged, your primary care physician will handle any further medical issues. Please note that NO REFILLS for any discharge medications will be authorized once you are discharged, as it is imperative that you return to your primary care physician (or establish a relationship with a primary care physician if you do not have one) for your aftercare needs so that they can reassess your need for medications and monitor your lab values. ° ° °

## 2016-03-21 ENCOUNTER — Telehealth: Payer: Self-pay | Admitting: Radiology

## 2016-03-21 LAB — CULTURE, BLOOD (ROUTINE X 2)
Culture: NO GROWTH
Culture: NO GROWTH

## 2016-03-21 NOTE — Telephone Encounter (Signed)
Notified pt of surgery scheduled with Dr Apolinar JunesBrandon on 03/29/16, pre-admit phone interview on 03/24/16 between 1-5pm & to call day prior to surgery for arrival time to [facility]. Pt voices understanding.

## 2016-03-22 NOTE — H&P (Addendum)
01/20/2016 --> updated 03/03/16 by Dr. Apolinar Junes.  Presumably based small left UVJ stone (pain free) and now plan to treat right sided UPJ stone.    [name] 02-14-1971 [id number]  Referring provider: Dione Housekeeper, MD [address] [place], [place] [address]      Chief Complaint  Patient presents with  . Hematuria    referred by   . New Patient (Initial Visit)    HPI: Patient is a 45 -year-old Caucasian male who presents today as a referral from their PCP, Olmedo, for gross hematuria.    Patient doesn't have a prior history of microscopic hematuria.    He does not have a prior history of recurrent urinary tract infections,  trauma to the genitourinary tract, BPH or malignancies of the genitourinary tract.  He does have a history of nephrolithiasis.    He does not have a family medical history of nephrolithiasis or hematuria.  Father was diagnosed with prostate cancer at age 29.  Today, he is not having symptoms of frequent urination, urgency, dysuria, nocturia, incontinence, hesitancy, intermittency, straining to urinate or a weak urinary stream.  His UA today demonstrates 0-2 RBC's/hpf.  He had been experiencing one month of suprapubic discomfort and intermittent episodes of gross hematuria.Marland Kitchen  He is not experiencing any suprapubic pain, abdominal pain or flank pain. He denies any recent fevers, chills, nausea or vomiting.   He did have a non contrast CT which demonstrated mild hydronephrosis and hydroureter secondary to a 1 mm stone. had any recent imaging studies.     He is not a smoker.   He is not exposed to secondhand smoke.  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    PMH:     Past Medical History  Diagnosis Date  . Acid reflux   . Sleep apnea   . Hematuria     Surgical History:      Past Surgical History  Procedure Laterality Date  . Laparoscopic gastric sleeve resection    . Knee surgery      Home Medications:     Medication List       This list is accurate as of: 01/20/16  3:24 PM.  Always use your most recent med list.                omeprazole 20 MG capsule  Commonly known as:  PRILOSEC  Take by mouth.        Allergies:      Allergies  Allergen Reactions  . Penicillins Itching and Rash    Family History:      Family History  Problem Relation Age of Onset  . Prostate cancer Father   . Kidney disease Neg Hx   . Kidney cancer Neg Hx     Social History:  reports that he has never smoked. He does not have any smokeless tobacco history on file. He reports that he drinks alcohol. He reports that he does not use illicit drugs.  ROS: UROLOGY Frequent Urination?: No Hard to postpone urination?: No Burning/pain with urination?: No Get up at night to urinate?: No Leakage of urine?: No Urine stream starts and stops?: No Trouble starting stream?: No Do you have to strain to urinate?: No Blood in urine?: Yes Urinary tract infection?: No Sexually transmitted disease?: No Injury to kidneys or bladder?: No Painful intercourse?: No Weak stream?: No Erection problems?: No Penile pain?: No  Gastrointestinal Nausea?: No Vomiting?: No Indigestion/heartburn?: Yes Diarrhea?: No Constipation?: No  Constitutional Fever: No Night sweats?: No Weight loss?: No Fatigue?: No  Skin Skin rash/lesions?: No Itching?: No  Eyes Blurred vision?: No Double vision?: No  Ears/Nose/Throat Sore throat?: No Sinus problems?: No  Hematologic/Lymphatic Swollen glands?: No Easy bruising?: No  Cardiovascular Leg swelling?: No Chest pain?: No  Respiratory Cough?: No Shortness of breath?: No  Endocrine Excessive thirst?: No  Musculoskeletal Back pain?: No Joint pain?: No  Neurological Headaches?: Yes Dizziness?: No  Psychologic Depression?: No Anxiety?: No  Physical Exam: BP 132/87 mmHg  Pulse 64  Ht 5\' 6"  (1.676 m)  Wt 193 lb  (87.544 kg)  BMI 31.17 kg/m2  Constitutional: Well nourished. Alert and oriented, No acute distress. HEENT: [place] AT, moist mucus membranes. Trachea midline, no masses. Cardiovascular: No clubbing, cyanosis, or edema. Respiratory: Normal respiratory effort, no increased work of breathing. GI: Abdomen is soft, non tender, non distended, no abdominal masses. Liver and spleen not palpable.  No hernias appreciated.  Stool sample for occult testing is not indicated.   GU: No CVA tenderness.  No bladder fullness or masses.  Patient with circumcised phallus.  Urethral meatus is patent.  No penile discharge. No penile lesions or rashes. Scrotum without lesions, cysts, rashes and/or edema.  Testicles are located scrotally bilaterally. No masses are appreciated in the testicles. Left and right epididymis are normal. Rectal: Patient with  normal sphincter tone. Anus and perineum without scarring or rashes. No rectal masses are appreciated. Prostate is approximately 45 grams, no nodules are appreciated. Seminal vesicles are normal. Skin: No rashes, bruises or suspicious lesions. Lymph: No cervical or inguinal adenopathy. Neurologic: Grossly intact, no focal deficits, moving all 4 extremities. Psychiatric: Normal mood and affect.  Laboratory Data: RecentLabs       Lab Results  Component Value Date   WBC 14.0* 05/23/2013   HGB 16.0 05/23/2013   HCT 48.8 05/23/2013   MCV 79* 05/23/2013   PLT 410 05/23/2013      RecentLabs       Lab Results  Component Value Date   CREATININE 1.07 05/23/2013       RecentLabs       Lab Results  Component Value Date   HGBA1C 5.7 04/11/2013      Recent[facility]       Lab Results  Component Value Date   TSH 1.40 04/11/2013        Recent[facility]       Lab Results  Component Value Date   AST 21 05/23/2013     Recent[facility]abs       Lab Results  Component Value Date   ALT 49 05/23/2013       Urinalysis      Results for  orders placed or performed in visit on 01/20/16  CULTURE, URINE COMPREHENSIVE  Result Value Ref Range   Urine Culture, Comprehensive Final report    Result 1 Comment   Microscopic Examination  Result Value Ref Range   WBC, UA 0-5 0 -  5 /hpf   RBC, UA 0-2 0 -  2 /hpf   Epithelial Cells (non renal) None seen 0 - 10 /hpf   Bacteria, UA None seen None seen/Few  Urinalysis, Complete  Result Value Ref Range   Specific Gravity, UA 1.025 1.005 - 1.030   pH, UA 7.0 5.0 - 7.5   Color, UA Yellow Yellow   Appearance Ur Clear Clear   Leukocytes, UA Negative Negative   Protein, UA Negative Negative/Trace   Glucose, UA Negative Negative   Ketones,  UA Negative Negative   RBC, UA 1+ (A) Negative   Bilirubin, UA Negative Negative   Urobilinogen, Ur 0.2 0.2 - 1.0 mg/dL   Nitrite, UA Negative Negative   Microscopic Examination See below:   BUN+Creat  Result Value Ref Range   BUN 14 6 - 24 mg/dL   Creatinine, Ser 1.61 0.76 - 1.27 mg/dL   GFR calc non Af Amer 107 >59 mL/min/1.73   GFR calc Af Amer 124 >59 mL/min/1.73   BUN/Creatinine Ratio 18 9 - 20    Pertinent Imaging: PRIOR REPORT IMPORTED FROM THE [facility] WORKFLOW SYSTEM   REASON FOR EXAM: flank pain  COMMENTS:   PROCEDURE: CT - CT ABDOMEN /PELVIS WO (STONE) - May 23 2013 6:53PM   RESULT: Axial noncontrast CT scanning was performed through the  abdomen  and pelvis with reconstructions at 3 mm intervals and slice thicknesses.  Review of multiplanar reconstructed images was performed separately on the  VIA monitor.   There is mild hydronephrosis and hydroureter on the left. This is due to a  1  mm diameter stone on image 134 in the distal left ureter. No other stones  are demonstrated on the left. There is likely an exophytic cyst from the  lower pole posterior laterally. On the right there is a punctate mid to  upper pole stone not producing obstruction. The partially  distended  urinary  bladder is normal in appearance.   The liver, gallbladder, spleen, partially distended stomach, pancreas, and  adrenal glands are normal in appearance. The caliber of the abdominal  aorta  is normal. There is a retroaortic left renal vein. Unopacified loops of  small and large bowel exhibit no evidence of ileus nor of obstruction.   The lumbar vertebral bodies are preserved in height. The lung bases are  clear.   IMPRESSION:  1. There is mild hydronephrosis and hydroureter secondary to a 1 mm  diameter  stone in the distal third of the left ureter visible on image 134. No  other  stones on the left are demonstrated.  2. On the right there is a punctate mid upper pole stone not producing  obstruction.  3. There is no acute hepatobiliary abnormality nor acute bowel  abnormality.  4. There is no intra-abdominal nor pelvic lymphadenopathy or inflammatory  change.    Assessment & Plan:    1. Gross hematuria:    I explained to the patient that there are a number of causes that can be associated with blood in the urine, such as stones,  BPH, UTI's, damage to the urinary tract and/or cancer.  At this time, I felt that the patient warranted further urologic evaluation.   The AUA guidelines state that a CT urogram is the preferred imaging study to evaluate hematuria.  I explained to the patient that a contrast material will be injected into a vein and that in rare instances, an allergic reaction can result and may even life threatening   The patient denies any allergies to contrast, iodine and/or seafood and is not taking metformin.  Following the imaging study,  I've recommended a cystoscopy. I described how this is performed, typically in an office setting with a flexible cystoscope. We described the risks, benefits, and possible side effects, the most common of which is a minor amount of blood in the urine and/or burning which usually  resolves in 24 to 48 hours.   The patient had the opportunity to ask questions which were answered. Based upon this  discussion, the patient is willing to proceed. Therefore, I've ordered: a CT Urogram and cystoscopy.  He will return following all of the above for discussion of the results.     - Urinalysis, Complete - CULTURE, URINE COMPREHENSIVE - BUN+Creat  2. History of nephrolithiasis:   Stone composition is unknown at this time.  See above.     Return for CT Urogram report and cystoscopy for hematuria.  These notes generated with voice recognition software. I apologize for typographical errors.  Michiel CowboySHANNON MCGOWAN, PA-C  [facility] [address], [address] [place], [place] [address] [phone]  02/20/2016: Addendum-patient was seen in [facility] emergency room at [facility] on [facility]/19/2016. He was found to have a 3 mm left UVJ stone and a right UPJ stone. Patient is encouraged to drink copious amounts of fluid (2.5 mL) and continue tamsulosin for medical expulsive therapy for the left UVJ stone.  Patient will be traveling out of the state next week.   When he returns, he will undergo right ESWL for the right UPJ stone. The procedure is explained to the patient.  I stated that the ESWL may not be successful in fragmenting the stones due to the position and size. I also explained the phenomenon of [place] with the stones that would require a ureteral stent placement. The effectiveness of the treatment should reach 88%.   His questions are answered and he wishes to proceed.

## 2016-03-24 ENCOUNTER — Ambulatory Visit (INDEPENDENT_AMBULATORY_CARE_PROVIDER_SITE_OTHER): Payer: BLUE CROSS/BLUE SHIELD | Admitting: Urology

## 2016-03-24 ENCOUNTER — Other Ambulatory Visit: Payer: Self-pay | Admitting: Radiology

## 2016-03-24 ENCOUNTER — Ambulatory Visit
Admission: RE | Admit: 2016-03-24 | Discharge: 2016-03-24 | Disposition: A | Discharge status: Home / Self Care | Payer: BLUE CROSS/BLUE SHIELD | Source: Ambulatory Visit | Attending: Urology | Admitting: Urology

## 2016-03-24 ENCOUNTER — Encounter
Admit: 2016-03-24 | Discharge: 2016-03-24 | Disposition: A | Discharge status: Home / Self Care | Payer: BLUE CROSS/BLUE SHIELD | Attending: Urology | Admitting: Urology

## 2016-03-24 ENCOUNTER — Encounter: Payer: Self-pay | Admitting: Urology

## 2016-03-24 ENCOUNTER — Telehealth: Payer: Self-pay | Admitting: *Deleted

## 2016-03-24 VITALS — BP 124/84 | HR 90 | Ht 66.0 in | Wt 189.4 lb

## 2016-03-24 DIAGNOSIS — N201 Calculus of ureter: Secondary | ICD-10-CM | POA: Insufficient documentation

## 2016-03-24 DIAGNOSIS — A419 Sepsis, unspecified organism: Secondary | ICD-10-CM

## 2016-03-24 DIAGNOSIS — N200 Calculus of kidney: Secondary | ICD-10-CM | POA: Diagnosis present

## 2016-03-24 DIAGNOSIS — Z9889 Other specified postprocedural states: Secondary | ICD-10-CM

## 2016-03-24 DIAGNOSIS — R310 Gross hematuria: Secondary | ICD-10-CM

## 2016-03-24 DIAGNOSIS — N390 Urinary tract infection, site not specified: Secondary | ICD-10-CM

## 2016-03-24 MED ORDER — CEFUROXIME AXETIL 500 MG PO TABS: 500.0000 mg | ORAL_TABLET | Freq: Two times a day (BID) | ORAL | 0 refills | Status: AC

## 2016-03-24 NOTE — Patient Instructions (Signed)
  Your procedure is scheduled on: 03-29-16 Report to Same Day Surgery 2nd floor medical mall To find out your arrival time please call [phone] between 1PM - 3PM on 03-28-16  Remember: Instructions that are not followed completely may result in serious medical risk, up to and including death, or upon the discretion of your surgeon and anesthesiologist your surgery may need to be rescheduled.    _x___ 1. Do not eat food or drink liquids after midnight. No gum chewing or hard candies.     __x__ 2. No Alcohol for 24 hours before or after surgery.   __x__3. No Smoking for 24 prior to surgery.   ____  4. Bring all medications with you on the day of surgery if instructed.    __x__ 5. Notify your doctor if there is any change in your medical condition     (cold, fever, infections).     Do not wear jewelry, make-up, hairpins, clips or nail polish.  Do not wear lotions, powders, or perfumes. You may wear deodorant.  Do not shave 48 hours prior to surgery. Men may shave face and neck.  Do not bring valuables to the hospital.    [facility] is not responsible for any belongings or valuables.               Contacts, dentures or bridgework may not be worn into surgery.  Leave your suitcase in the car. After surgery it may be brought to your room.  For patients admitted to the hospital, discharge time is determined by your treatment team.   Patients discharged the day of surgery will not be allowed to drive home.    Please read over the following fact sheets that you were given:   [facility] Preparing for Surgery and or MRSA Information   _x___ Take these medicines the morning of surgery with A SIP OF WATER:    1. FLOMAX  2. PRILOSEC  3.  4.  5.  6.  ____ Fleet Enema (as directed)   ____ Use CHG Soap or sage wipes as directed on instruction sheet   ____ Use inhalers on the day of surgery and bring to hospital day of surgery  ____ Stop metformin 2 days prior to surgery    ____  Take 1/2 of usual insulin dose the night before surgery and none on the morning of surgery.   ____ Stop aspirin or coumadin, or plavix  _x__ Stop Anti-inflammatories such as Advil, Aleve, Ibuprofen, Motrin, Naproxen,          Naprosyn, Goodies powders or aspirin products. Ok to take Tylenol.   ____ Stop supplements until after surgery.    ____ Bring C-Pap to the hospital.

## 2016-03-24 NOTE — Telephone Encounter (Signed)
Spoke with patient in reference to Carollee HerterShannon wanting to continue his Ceftin until Surgery Tuesday. Per Leeroy Bockhelsea she ordered medication and LM for patient to let him know. I could not find documentation of call to patient so I called to make sure patient knew about the medication and to continue it. Patient stated he received message and I let him know that I was checking to make sure he knew to continue medication. Patient ok with plan.

## 2016-03-24 NOTE — Progress Notes (Signed)
03/24/2016 10:16 AM   [name] 1970/08/29 [id number]  Referring provider: Valera Castle, MD [address] [place], [place] [address]  Chief Complaint  Patient presents with  . Follow-up    3 weeks post Litho with KUB before    HPI: Patient is a 45 -year-old Caucasian male who presents today for follow up after undergoing ESWL for a right UPJ stone.    Background history Patient was a referral from their PCP, [name], for gross hematuria.   CT Urogram noted a 9 mm calculus in the right renal collecting system most likely responsible for the patient's intermittent hematuria.  Multiple bilateral renal cysts. No worrisome renal lesions. No ureteral or bladder lesions.  Stable hyperdense/hemorrhagic cyst associate with the lower pole region of left kidney.   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 No complicating features.  Status post cholecystectomy.  No biliary dilatation.  A CT renal stone stone study performed two weeks later noted a 3 mm stone in the left ureter with left-sided hydronephrosis. In retrospect, the stone was present on the February 03, 2016 study. This stone has only migrated distally 1 or 2 cm in the interval.  Stones in of the right renal pelvis without active obstruction.  The stones could certainly cause intermittent obstruction given their location.  Mild atherosclerotic change in the abdominal aorta.  Patient attempted MET for the left UVJ stone.  It was felt that it passed and he underwent right ESWL for the right UPJ stone.  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   He then develped pyelonephritis and was admitted for IV antibioitcs.  Today, he complains of gross hematuria, dysuria and nocturia.  He is also fatigued from the course of his stone treaments.  He hsa not had fevers, chills, nausea or vomiting.  He is currently on Ceftin.    KUB today notes  bilateral ureteral stents in place and possible bilateral ureteral stones.  He is currently scheduled for cystoscopy with bilateral URS/LL/stent exchange.    PMH: Past Medical History:  Diagnosis Date  . Acid reflux   . Hematuria   . Kidney stones   . Sleep apnea   . Urolithiasis     Surgical History: Past Surgical History:  Procedure Laterality Date  . CYSTOSCOPY W/ RETROGRADES Bilateral 03/05/2016   Procedure: CYSTOSCOPY WITH RETROGRADE PYELOGRAM;  Surgeon: Alex Retort, MD;  Location: [facility];  Service: Urology;  Laterality: Bilateral;  . CYSTOSCOPY WITH STENT PLACEMENT Bilateral 03/05/2016   Procedure: CYSTOSCOPY WITH STENT PLACEMENT;  Surgeon: Alex Retort, MD;  Location: [facility];  Service: Urology;  Laterality: Bilateral;  . EXTRACORPOREAL SHOCK WAVE LITHOTRIPSY Right 03/03/2016   Procedure: EXTRACORPOREAL SHOCK WAVE LITHOTRIPSY (ESWL);  Surgeon: Alex Espy, MD;  Location: [facility];  Service: Urology;  Laterality: Right;  . KNEE SURGERY    . LAPAROSCOPIC GASTRIC SLEEVE RESECTION      Home Medications:    Medication List       Accurate as of 03/24/16 10:16 AM. Always use your most recent med list.          cefUROXime 500 MG tablet Commonly known as:  CEFTIN Take 1 tablet (500 mg total) by mouth 2 (two) times daily with a meal.   ciprofloxacin 500 MG tablet Commonly known as:  CIPRO Take 500 mg by mouth 2 (two) times daily.   omeprazole 20 MG capsule Commonly  known as:  PRILOSEC Take 20 mg by mouth at bedtime.   ondansetron 4 MG disintegrating tablet Commonly known as:  ZOFRAN ODT Take 1 tablet (4 mg total) by mouth every 8 (eight) hours as needed for nausea or vomiting.   oxyCODONE-acetaminophen 5-325 MG tablet Commonly known as:  ROXICET Take 1 tablet by mouth every 4 (four) hours as needed for severe pain.   tamsulosin 0.4 MG Caps capsule Commonly known as:  FLOMAX Take 1 capsule (0.4 mg total) by mouth daily after breakfast.        Allergies:  Allergies  Allergen Reactions  . Penicillins Itching and Rash    Has patient had a PCN reaction causing immediate rash, facial/tongue/throat swelling, SOB or lightheadedness with hypotension: No Has patient had a PCN reaction causing severe rash involving mucus membranes or skin necrosis: No Has patient had a PCN reaction that required hospitalization No Has patient had a PCN reaction occurring within the last 10 years: No If all of the above answers are "NO", then may proceed with Cephalosporin use.    Family History: Family History  Problem Relation Age of Onset  . Prostate cancer Father   . Kidney disease Mother   . Kidney cancer Neg Hx     Social History:  reports that he has never smoked. He has never used smokeless tobacco. He reports that he drinks alcohol. He reports that he does not use drugs.  ROS: UROLOGY Frequent Urination?: No Hard to postpone urination?: No Burning/pain with urination?: Yes Get up at night to urinate?: Yes Leakage of urine?: No Urine stream starts and stops?: No Trouble starting stream?: No Do you have to strain to urinate?: No Blood in urine?: Yes Urinary tract infection?: No Sexually transmitted disease?: No Injury to kidneys or bladder?: No Painful intercourse?: No Weak stream?: No Erection problems?: No Penile pain?: No  Gastrointestinal Nausea?: No Vomiting?: No Indigestion/heartburn?: No Diarrhea?: No Constipation?: No  Constitutional Fever: No Night sweats?: No Weight loss?: No Fatigue?: No  Skin Skin rash/lesions?: No Itching?: No  Eyes Blurred vision?: No Double vision?: No  Ears/Nose/Throat Sore throat?: No Sinus problems?: No  Hematologic/Lymphatic Swollen glands?: No Easy bruising?: No  Cardiovascular Leg swelling?: No Chest pain?: No  Respiratory Cough?: No Shortness of breath?: No  Endocrine Excessive thirst?: No  Musculoskeletal Back pain?: No Joint pain?:  No  Neurological Headaches?: No Dizziness?: No  Psychologic Depression?: No Anxiety?: No  Physical Exam: BP 124/84   Pulse 90   Ht _0  (1.676 m)   Wt 189 lb 6.4 oz (85.9 kg)   BMI 30.57 kg/m   Constitutional: Well nourished. Alert and oriented, No acute distress. HEENT: [place] AT, moist mucus membranes. Trachea midline, no masses. Cardiovascular: No clubbing, cyanosis, or edema. Respiratory: Normal respiratory effort, no increased work of breathing. GI: Abdomen is soft, non tender, non distended, no abdominal masses. Liver and spleen not palpable.  No hernias appreciated.  Stool sample for occult testing is not indicated.   GU: No CVA tenderness.  No bladder fullness or masses.   Skin: No rashes, bruises or suspicious lesions. Lymph: No cervical or inguinal adenopathy. Neurologic: Grossly intact, no focal deficits, moving all 4 extremities. Psychiatric: Normal mood and affect.  Laboratory Data: Lab Results  Component Value Date   WBC 7.9 03/18/2016   HGB 12.3 (L) 03/18/2016   HCT 36.9 (L) 03/18/2016   MCV 81.5 03/18/2016   PLT 291 03/18/2016    Lab Results  Component Value Date  CREATININE 0.80 03/16/2016     Lab Results  Component Value Date   HGBA1C 5.7 04/11/2013    Lab Results  Component Value Date   TSH 1.40 04/11/2013      Lab Results  Component Value Date   AST 12 (L) 03/16/2016   Lab Results  Component Value Date   ALT 11 (L) 03/16/2016     Urinalysis Results for orders placed or performed during the hospital encounter of 03/15/16  Urine C&S  Result Value Ref Range   Specimen Description URINE, RANDOM    Special Requests NONE    Culture (A)     >=100,000 COLONIES/mL STAPHYLOCOCCUS SPECIES (COAGULASE NEGATIVE)   Report Status 03/18/2016 FINAL    Organism ID, Bacteria STAPHYLOCOCCUS SPECIES (COAGULASE NEGATIVE) (A)       Susceptibility   Staphylococcus species (coagulase negative) - MIC*    CIPROFLOXACIN >=8 RESISTANT Resistant      GENTAMICIN <=0.5 SENSITIVE Sensitive     NITROFURANTOIN <=16 SENSITIVE Sensitive     OXACILLIN Value in next row Sensitive      <=0.25 SENSITIVEWARNING: For oxacillin-resistant S.aureus and coagulase-negative staphylococci (MRS), other beta-lactam agents, ie, penicillins, beta-lactam/beta-lactamase inhibitor combinations, cephems (with the exception of the cephalosporins with anti-MRSA activity), and carbapenems, may appear active in vitro, but are not effective clinically.  --CLSI, Vol.32 No.3, January 2012, pg 70.    TETRACYCLINE Value in next row Sensitive      <=0.25 SENSITIVEWARNING: For oxacillin-resistant S.aureus and coagulase-negative staphylococci (MRS), other beta-lactam agents, ie, penicillins, beta-lactam/beta-lactamase inhibitor combinations, cephems (with the exception of the cephalosporins with anti-MRSA activity), and carbapenems, may appear active in vitro, but are not effective clinically.  --CLSI, Vol.32 No.3, January 2012, pg 70.    VANCOMYCIN Value in next row Sensitive      <=0.25 SENSITIVEWARNING: For oxacillin-resistant S.aureus and coagulase-negative staphylococci (MRS), other beta-lactam agents, ie, penicillins, beta-lactam/beta-lactamase inhibitor combinations, cephems (with the exception of the cephalosporins with anti-MRSA activity), and carbapenems, may appear active in vitro, but are not effective clinically.  --CLSI, Vol.32 No.3, January 2012, pg 70.    TRIMETH/SULFA Value in next row Resistant      <=0.25 SENSITIVEWARNING: For oxacillin-resistant S.aureus and coagulase-negative staphylococci (MRS), other beta-lactam agents, ie, penicillins, beta-lactam/beta-lactamase inhibitor combinations, cephems (with the exception of the cephalosporins with anti-MRSA activity), and carbapenems, may appear active in vitro, but are not effective clinically.  --CLSI, Vol.32 No.3, January 2012, pg 70.    CLINDAMYCIN Value in next row Sensitive      <=0.25 SENSITIVEWARNING: For  oxacillin-resistant S.aureus and coagulase-negative staphylococci (MRS), other beta-lactam agents, ie, penicillins, beta-lactam/beta-lactamase inhibitor combinations, cephems (with the exception of the cephalosporins with anti-MRSA activity), and carbapenems, may appear active in vitro, but are not effective clinically.  --CLSI, Vol.32 No.3, January 2012, pg 70.    RIFAMPIN Value in next row Sensitive      <=0.25 SENSITIVEWARNING: For oxacillin-resistant S.aureus and coagulase-negative staphylococci (MRS), other beta-lactam agents, ie, penicillins, beta-lactam/beta-lactamase inhibitor combinations, cephems (with the exception of the cephalosporins with anti-MRSA activity), and carbapenems, may appear active in vitro, but are not effective clinically.  --CLSI, Vol.32 No.3, January 2012, pg 70.    * >=100,000 COLONIES/mL STAPHYLOCOCCUS SPECIES (COAGULASE NEGATIVE)  Blood Culture (routine x 2)  Result Value Ref Range   Specimen Description BLOOD RIGHT ASSIST CONTROL    Special Requests BOTTLES DRAWN AEROBIC AND ANAEROBIC 12CC    Culture NO GROWTH 5 DAYS    Report Status 03/21/2016 FINAL   Blood Culture (routine x  2)  Result Value Ref Range   Specimen Description BLOOD LEFT HAND    Special Requests BOTTLES DRAWN AEROBIC AND ANAEROBIC [place]    Culture NO GROWTH 5 DAYS    Report Status 03/21/2016 FINAL   Urinalysis complete, with microscopic- may I&O cath if menses  Result Value Ref Range   Color, Urine YELLOW (A) YELLOW   APPearance CLOUDY (A) CLEAR   Glucose, UA NEGATIVE NEGATIVE mg/dL   Bilirubin Urine NEGATIVE NEGATIVE   Ketones, ur NEGATIVE NEGATIVE mg/dL   Specific Gravity, Urine 1.015 1.005 - 1.030   Hgb urine dipstick 3+ (A) NEGATIVE   pH 8.0 5.0 - 8.0   Protein, ur >500 (A) NEGATIVE mg/dL   Nitrite POSITIVE (A) NEGATIVE   Leukocytes, UA 3+ (A) NEGATIVE   RBC / HPF TOO NUMEROUS TO COUNT 0 - 5 RBC/hpf   WBC, UA TOO NUMEROUS TO COUNT 0 - 5 WBC/hpf   Bacteria, UA FEW (A) NONE SEEN    Squamous Epithelial / LPF NONE SEEN NONE SEEN  Basic metabolic panel  Result Value Ref Range   Sodium 135 135 - 145 mmol/L   Potassium 3.7 3.5 - 5.1 mmol/L   Chloride 102 101 - 111 mmol/L   CO2 26 22 - 32 mmol/L   Glucose, Bld 104 (H) 65 - 99 mg/dL   BUN 11 6 - 20 mg/dL   Creatinine, Ser 0.99 0.61 - 1.24 mg/dL   Calcium 9.2 8.9 - 10.3 mg/dL   GFR calc non Af Amer >60 >60 mL/min   GFR calc Af Amer >60 >60 mL/min   Anion gap 7 5 - 15  CBC  Result Value Ref Range   WBC 11.5 (H) 3.8 - 10.6 K/uL   RBC 5.13 4.40 - 5.90 MIL/uL   Hemoglobin 13.8 13.0 - 18.0 g/dL   HCT 41.4 40.0 - 52.0 %   MCV 80.7 80.0 - 100.0 fL   MCH 26.8 26.0 - 34.0 pg   MCHC 33.3 32.0 - 36.0 g/dL   RDW 12.7 11.5 - 14.5 %   Platelets 339 150 - 440 K/uL  Lactic acid, plasma  Result Value Ref Range   Lactic Acid, Venous 1.3 0.5 - 1.9 mmol/L  Lactic acid, plasma  Result Value Ref Range   Lactic Acid, Venous 1.0 0.5 - 1.9 mmol/L  Comprehensive metabolic panel  Result Value Ref Range   Sodium 134 (L) 135 - 145 mmol/L   Potassium 3.4 (L) 3.5 - 5.1 mmol/L   Chloride 108 101 - 111 mmol/L   CO2 21 (L) 22 - 32 mmol/L   Glucose, Bld 104 (H) 65 - 99 mg/dL   BUN 9 6 - 20 mg/dL   Creatinine, Ser 0.80 0.61 - 1.24 mg/dL   Calcium 8.0 (L) 8.9 - 10.3 mg/dL   Total Protein 6.0 (L) 6.5 - 8.1 g/dL   Albumin 3.1 (L) 3.5 - 5.0 g/dL   AST 12 (L) 15 - 41 U/L   ALT 11 (L) 17 - 63 U/L   Alkaline Phosphatase 54 38 - 126 U/L   Total Bilirubin 0.9 0.3 - 1.2 mg/dL   GFR calc non Af Amer >60 >60 mL/min   GFR calc Af Amer >60 >60 mL/min   Anion gap 5 5 - 15  CBC  Result Value Ref Range   WBC 11.9 (H) 3.8 - 10.6 K/uL   RBC 4.81 4.40 - 5.90 MIL/uL   Hemoglobin 12.7 (L) 13.0 - 18.0 g/dL   HCT 39.0 (L) 40.0 -  52.0 %   MCV 81.0 80.0 - 100.0 fL   MCH 26.4 26.0 - 34.0 pg   MCHC 32.6 32.0 - 36.0 g/dL   RDW 12.8 11.5 - 14.5 %   Platelets 290 150 - 440 K/uL  Glucose, capillary  Result Value Ref Range   Glucose-Capillary 103 (H) 65 -  99 mg/dL  CBC  Result Value Ref Range   WBC 7.9 3.8 - 10.6 K/uL   RBC 4.53 4.40 - 5.90 MIL/uL   Hemoglobin 12.3 (L) 13.0 - 18.0 g/dL   HCT 36.9 (L) 40.0 - 52.0 %   MCV 81.5 80.0 - 100.0 fL   MCH 27.2 26.0 - 34.0 pg   MCHC 33.3 32.0 - 36.0 g/dL   RDW 13.1 11.5 - 14.5 %   Platelets 291 150 - 440 K/uL    Pertinent Imaging: CLINICAL DATA:  History of bilateral kidney stones. Bilateral flank pain and hematuria. Post lithotripsy.  EXAM: ABDOMEN - 1 VIEW  COMPARISON:  None.  FINDINGS: The bowel gas pattern is normal. No radio-opaque calculi or other significant radiographic abnormality are seen.  Bilateral ureteral stents in satisfactory radiographic position. Large amount of stool overlies the renal shadows and prevents the evaluation for renal calculi.  Subtle few mm radiopaque densities seen alone midportion of bilateral ureteral catheters which may represent ureteral stones. Similar densities seen at the expected location of distal right ureter and possibly within the urinary bladder.  IMPRESSION: Possible few mm bilateral ureteral stones.  Nonobstructive bowel gas pattern.   Electronically Signed   By: Fidela Salisbury M.D.   On: 03/24/2016 10:02  Assessment & Plan:    Patient will undergo cystoscopy with bilateral ureteroscopy with laser lithotripsy with bilateral stent exchange for definitive treatment of his bilateral stone fragments.    1. S/P right ESWL  - patient with Steinstrasse after ESWL  - underwent stent placement- right ureteral stent in place  - scheduled for bilateral URS/LL/ureteral stent exchange  2. Left UVJ stone  - left ureteral stent in place  - scheduled for bilateral URS/LL/ureteral stent exchange  3. Left and Right hydronephrosis  - obtain RUS to ensure the hydronephrosis has resolved once stones have been cleared   4. Gross hematuria  - continue to monitor the patient's UA after the treatment/passage of the stone to  ensure the hematuria has resolved  - if hematuria persists, we will pursue a hematuria workup with CT Urogram and cystoscopy if appropriate  5. History of pyelonephritis  - continue the Ceftin  - Advised to contact our office or seek treatment in the ED if becomes febrile or pain/ vomiting are difficult control in order to arrange for emergent/urgent intervention   No Follow-up on file.  These notes generated with voice recognition software. I apologize for typographical errors.  Zara Council, [facility] [address] [place], [place] [address] [phone]

## 2016-03-29 ENCOUNTER — Ambulatory Visit
Admission: RE | Admit: 2016-03-29 | Discharge: 2016-03-29 | Disposition: A | Discharge status: Home / Self Care | Payer: BLUE CROSS/BLUE SHIELD | Source: Ambulatory Visit | Attending: Urology | Admitting: Urology

## 2016-03-29 ENCOUNTER — Ambulatory Visit: Payer: BLUE CROSS/BLUE SHIELD | Admitting: Anesthesiology

## 2016-03-29 ENCOUNTER — Encounter: Payer: Self-pay | Admitting: *Deleted

## 2016-03-29 ENCOUNTER — Telehealth: Payer: Self-pay | Admitting: Radiology

## 2016-03-29 ENCOUNTER — Encounter
Admission: RE | Disposition: A | Discharge status: Home / Self Care | Payer: Self-pay | Source: Ambulatory Visit | Attending: Urology

## 2016-03-29 DIAGNOSIS — K219 Gastro-esophageal reflux disease without esophagitis: Secondary | ICD-10-CM | POA: Diagnosis not present

## 2016-03-29 DIAGNOSIS — Z9049 Acquired absence of other specified parts of digestive tract: Secondary | ICD-10-CM | POA: Insufficient documentation

## 2016-03-29 DIAGNOSIS — N132 Hydronephrosis with renal and ureteral calculous obstruction: Secondary | ICD-10-CM | POA: Diagnosis not present

## 2016-03-29 DIAGNOSIS — Z880 Allergy status to penicillin: Secondary | ICD-10-CM | POA: Diagnosis not present

## 2016-03-29 DIAGNOSIS — Z87442 Personal history of urinary calculi: Secondary | ICD-10-CM | POA: Diagnosis not present

## 2016-03-29 DIAGNOSIS — N201 Calculus of ureter: Secondary | ICD-10-CM | POA: Diagnosis not present

## 2016-03-29 DIAGNOSIS — R310 Gross hematuria: Secondary | ICD-10-CM | POA: Diagnosis not present

## 2016-03-29 DIAGNOSIS — Z79899 Other long term (current) drug therapy: Secondary | ICD-10-CM | POA: Insufficient documentation

## 2016-03-29 DIAGNOSIS — G4730 Sleep apnea, unspecified: Secondary | ICD-10-CM | POA: Diagnosis not present

## 2016-03-29 DIAGNOSIS — Z9884 Bariatric surgery status: Secondary | ICD-10-CM | POA: Diagnosis not present

## 2016-03-29 DIAGNOSIS — Z8042 Family history of malignant neoplasm of prostate: Secondary | ICD-10-CM | POA: Insufficient documentation

## 2016-03-29 DIAGNOSIS — N200 Calculus of kidney: Secondary | ICD-10-CM

## 2016-03-29 DIAGNOSIS — N202 Calculus of kidney with calculus of ureter: Secondary | ICD-10-CM | POA: Diagnosis present

## 2016-03-29 DIAGNOSIS — N281 Cyst of kidney, acquired: Secondary | ICD-10-CM | POA: Diagnosis not present

## 2016-03-29 DIAGNOSIS — Z841 Family history of disorders of kidney and ureter: Secondary | ICD-10-CM | POA: Diagnosis not present

## 2016-03-29 HISTORY — PX: URETEROSCOPY: SHX842

## 2016-03-29 HISTORY — PX: URETEROSCOPY WITH HOLMIUM LASER LITHOTRIPSY: SHX6645

## 2016-03-29 HISTORY — PX: CYSTOSCOPY W/ URETERAL STENT PLACEMENT: SHX1429

## 2016-03-29 SURGERY — URETEROSCOPY, WITH LITHOTRIPSY USING HOLMIUM LASER
Anesthesia: General | Laterality: Right | Wound class: Clean Contaminated

## 2016-03-29 MED ORDER — HYDROMORPHONE HCL 1 MG/ML IJ SOLN
INTRAMUSCULAR | Status: DC | PRN
  Administered 2016-03-29: 0.5 mg via INTRAVENOUS

## 2016-03-29 MED ORDER — FENTANYL CITRATE (PF) 100 MCG/2ML IJ SOLN
INTRAMUSCULAR | Status: DC | PRN
  Administered 2016-03-29 (×2): 50 ug via INTRAVENOUS

## 2016-03-29 MED ORDER — ONDANSETRON HCL 4 MG/2ML IJ SOLN: 4.0000 mg | Freq: Once | INTRAMUSCULAR | Status: DC | PRN

## 2016-03-29 MED ORDER — LACTATED RINGERS IV SOLN
INTRAVENOUS | Status: DC
  Administered 2016-03-29 (×2): via INTRAVENOUS

## 2016-03-29 MED ORDER — OXYCODONE-ACETAMINOPHEN 5-325 MG PO TABS
ORAL_TABLET | ORAL | Status: AC
  Filled 2016-03-29: qty 1

## 2016-03-29 MED ORDER — LEVOFLOXACIN IN D5W 500 MG/100ML IV SOLN
INTRAVENOUS | Status: AC
  Filled 2016-03-29: qty 100

## 2016-03-29 MED ORDER — FENTANYL CITRATE (PF) 100 MCG/2ML IJ SOLN
INTRAMUSCULAR | Status: AC
  Administered 2016-03-29: 25 ug via INTRAVENOUS
  Filled 2016-03-29: qty 2

## 2016-03-29 MED ORDER — LEVOFLOXACIN IN D5W 500 MG/100ML IV SOLN
500.0000 mg | INTRAVENOUS | Status: AC
  Administered 2016-03-29: 500 mg via INTRAVENOUS

## 2016-03-29 MED ORDER — SUGAMMADEX SODIUM 200 MG/2ML IV SOLN
INTRAVENOUS | Status: DC | PRN
  Administered 2016-03-29: 171.8 mg via INTRAVENOUS

## 2016-03-29 MED ORDER — IOTHALAMATE MEGLUMINE 43 % IV SOLN
INTRAVENOUS | Status: DC | PRN
  Administered 2016-03-29: 20 mL

## 2016-03-29 MED ORDER — ROCURONIUM BROMIDE 100 MG/10ML IV SOLN
INTRAVENOUS | Status: DC | PRN
  Administered 2016-03-29: 30 mg via INTRAVENOUS
  Administered 2016-03-29: 10 mg via INTRAVENOUS

## 2016-03-29 MED ORDER — OXYBUTYNIN CHLORIDE 5 MG PO TABS: 5.0000 mg | ORAL_TABLET | Freq: Three times a day (TID) | ORAL | 0 refills | Status: DC | PRN

## 2016-03-29 MED ORDER — FENTANYL CITRATE (PF) 100 MCG/2ML IJ SOLN
25.0000 ug | INTRAMUSCULAR | Status: DC | PRN
  Administered 2016-03-29 (×4): 25 ug via INTRAVENOUS

## 2016-03-29 MED ORDER — PROPOFOL 10 MG/ML IV BOLUS
INTRAVENOUS | Status: DC | PRN
  Administered 2016-03-29: 150 mg via INTRAVENOUS

## 2016-03-29 MED ORDER — GENTAMICIN IN SALINE 1.6-0.9 MG/ML-% IV SOLN
80.0000 mg | INTRAVENOUS | Status: AC
  Administered 2016-03-29: 80 mg via INTRAVENOUS
  Filled 2016-03-29: qty 50

## 2016-03-29 MED ORDER — OXYCODONE-ACETAMINOPHEN 5-325 MG PO TABS: 1.0000 | ORAL_TABLET | ORAL | 0 refills | Status: DC | PRN

## 2016-03-29 MED ORDER — LIDOCAINE HCL (CARDIAC) 20 MG/ML IV SOLN
INTRAVENOUS | Status: DC | PRN
  Administered 2016-03-29: 100 mg via INTRAVENOUS

## 2016-03-29 MED ORDER — ONDANSETRON HCL 4 MG/2ML IJ SOLN
INTRAMUSCULAR | Status: DC | PRN
  Administered 2016-03-29: 4 mg via INTRAVENOUS

## 2016-03-29 MED ORDER — OXYCODONE-ACETAMINOPHEN 5-325 MG PO TABS
1.0000 | ORAL_TABLET | Freq: Once | ORAL | Status: AC
  Administered 2016-03-29: 1 via ORAL

## 2016-03-29 SURGICAL SUPPLY — 32 items
ADAPTER SCOPE UROLOK II (MISCELLANEOUS) IMPLANT
BAG DRAIN CYSTO-URO LG1000N (MISCELLANEOUS) ×4 IMPLANT
BASKET ZERO TIP 1.9FR (BASKET) ×4 IMPLANT
CATH FOL 2WAY LX 16X5 (CATHETERS) IMPLANT
CATH URETL 5X70 OPEN END (CATHETERS) ×4 IMPLANT
CNTNR SPEC 2.5X3XGRAD LEK (MISCELLANEOUS) ×3
CONRAY 43 FOR UROLOGY 50M (MISCELLANEOUS) ×4 IMPLANT
CONT SPEC 4OZ STER OR WHT (MISCELLANEOUS) ×1
CONTAINER SPEC 2.5X3XGRAD LEK (MISCELLANEOUS) ×3 IMPLANT
DRAPE UTILITY 15X26 TOWEL STRL (DRAPES) ×4 IMPLANT
FIBER LASER LITHO 273 (Laser) ×4 IMPLANT
GLOVE BIO SURGEON STRL SZ 6.5 (GLOVE) ×4 IMPLANT
GOWN STRL REUS W/ TWL LRG LVL3 (GOWN DISPOSABLE) ×6 IMPLANT
GOWN STRL REUS W/ TWL LRG LVL4 (GOWN DISPOSABLE) ×6 IMPLANT
GOWN STRL REUS W/TWL LRG LVL3 (GOWN DISPOSABLE) ×2
GOWN STRL REUS W/TWL LRG LVL4 (GOWN DISPOSABLE) ×2
GUIDEWIRE SUPER STIFF (WIRE) ×4 IMPLANT
HOLDER FOLEY CATH W/STRAP (MISCELLANEOUS) IMPLANT
INTRODUCER DILATOR DOUBLE (INTRODUCER) IMPLANT
KIT RM TURNOVER CYSTO AR (KITS) ×4 IMPLANT
PACK CYSTO AR (MISCELLANEOUS) ×4 IMPLANT
SENSORWIRE 0.038 NOT ANGLED (WIRE) ×4
SET CYSTO W/LG BORE CLAMP LF (SET/KITS/TRAYS/PACK) ×4 IMPLANT
SHEATH URETERAL 12FRX35CM (MISCELLANEOUS) IMPLANT
SOL .9 NS 3000ML IRR  AL (IV SOLUTION) ×1
SOL .9 NS 3000ML IRR UROMATIC (IV SOLUTION) ×3 IMPLANT
STENT URET 6FRX24 CONTOUR (STENTS) IMPLANT
STENT URET 6FRX26 CONTOUR (STENTS) ×8 IMPLANT
SURGILUBE 2OZ TUBE FLIPTOP (MISCELLANEOUS) ×4 IMPLANT
SYRINGE IRR TOOMEY STRL 70CC (SYRINGE) ×4 IMPLANT
WATER STERILE IRR 1000ML POUR (IV SOLUTION) ×4 IMPLANT
WIRE SENSOR 0.038 NOT ANGLED (WIRE) ×3 IMPLANT

## 2016-03-29 NOTE — Transfer of Care (Signed)
Immediate Anesthesia Transfer of Care Note  Patient: [name]  Procedure(s) Performed: Procedure(s): URETEROSCOPY WITH HOLMIUM LASER LITHOTRIPSY (Right) CYSTOSCOPY WITH STENT REPLACEMENT (Bilateral) URETEROSCOPY (Left)  Patient Location: PACU  Anesthesia Type:General  Level of Consciousness: awake, alert  and oriented  Airway & Oxygen Therapy: Patient Spontanous Breathing and Patient connected to nasal cannula oxygen  Post-op Assessment: Report given to RN and Post -op Vital signs reviewed and stable  Post vital signs: Reviewed and stable  Last Vitals:  Vitals:   03/29/16 0848  BP: 133/82  Pulse: 66  Resp: 16  Temp: 36.9 C    Last Pain:  Vitals:   03/29/16 0848  TempSrc: Oral  PainSc: 2          Complications: No apparent anesthesia complications

## 2016-03-29 NOTE — Op Note (Signed)
Date of procedure: 03/29/16  Preoperative diagnosis:  1. Bilateral ureteral stones   Postoperative diagnosis:  1. Negative left ureteroscopy 2. Right ureteral stone   Procedure: 1. Bilateral ureteroscopy 2. Laser lithotripsy 3. Basket extraction of stone fragment 4. Bilateral ureteral stent exchange 5. Bilateral retrograde pouchogram  Surgeon: Vanna ScotlandAshley Justa Hatchell, MD  Anesthesia: General  Complications: None  Intraoperative findings: Negative left ureteroscopy other than mild left ureteral edema. Right ureteral stone along with some small lower pole nonobstructing calculi treated.   EBL: Minimal  Specimens: Stone fragments  Drains: 6 x 26 French double-J ureteral stents bilaterally  Indication: [name] is a 45 y.o. patient with left small distal UVJ stone and ESWL on the right, located by steinstrasse with bilateral obstruction who underwent emergent ureteral stent placement complicated by pyelonephritis.  He returns today for definitive management of his stones bilaterally.   He does mention today that is past 3-4 stones within the past week which he brings with him to preop. He's been properly treated with antibiotics based on culture and sensitivity data.  After reviewing the management options for treatment, he elected to proceed with the above surgical procedure(s). We have discussed the potential benefits and risks of the procedure, side effects of the proposed treatment, the likelihood of the patient achieving the goals of the procedure, and any potential problems that might occur during the procedure or recuperation. Informed consent has been obtained.  Description of procedure:  The patient was taken to the operating room and general anesthesia was induced.  The patient was placed in the dorsal lithotomy position, prepped and draped in the usual sterile fashion, and preoperative antibiotics were administered. A preoperative time-out was performed.   A rigid 21 French  cystoscope was advanced per urethra into the bladder. The stents were seen in the bladder mildly encrusted with a biofilm crossing the midline. The distal coil of the left-sided stent was grasped and brought to level of the urethral meatus. The wire was then advanced through the stent up to the level of the kidney under fluoroscopic guidance leaving the wire in place and removing the stent. A semirigid ureteroscope was then advanced alongside the wire into the distal ureter and advanced all the way up to level of the proximal ureter. No stone fragments were identified within the lumen of the ureter whatsoever. There was a good amount of ureteral edema and debris presumably from the previous ureteral stent. As a precaution, a second Super Stiff wire was advanced all the way up to level of the kidney and the semirigid ureteroscope was exchanged for a total channel flexible ureteroscope all way up to level of the renal pelvis. Formal pyeloscopy was then performed. Contrast was injected as a retrograde pyelogram to create a roadmap of the upper tract system. There is no nonobstructing stones identified within the collecting system. The scope was then backed down the length of the ureter carefully inspecting along the way. Again there was no evidence of ureteral trauma or stones but there was some mild edema diffusely. Insetting of edema, the safety wire was backloaded over a rigid cystoscope and a 6 x 26 French double-J ureteral stent was replaced. The wire was partially withdrawn until full coil was noted within the renal pelvis. Wires and fully withdrawn and a full coil was noted in the bladder.   Attention was then turned to the right side. Again, the distal coil of the stent was grasped and brought to level of the urethral meatus. The  stent was then cannulated using a sensor wire up to the level of the kidney and removing the stent. Again, the wire was snapped in place and a semirigid ureteroscope was advanced to  level of the mid ureter where an approximately 5 mm stone was identified. Given the widely dilated ureter on this side, I was able to grasp this using a 1.9 [place] to plus nitinol basket and extract the stone without additional fragmentation. This was passed off the field for specimen. The scope was then advanced all the way up to level of the proximal ureter no other ureteral stones were identified. Again, a second safety wire was introduced up to level of the kidney and a dual lumen flexible ureteroscope was advanced to this level as well. Again, retrograde pyelogram was performed to create a roadmap of the right sided collecting system. If within the extreme lower pole, a few small 2 mm or less fragments were identified. A 275  laser fiber was then brought in and using the settings of 0.2 J and 40 Hz, the small stone debris was fragmented into very small particles proximal with the tip of the laser. One larger fragment was extracted using a basket. Once the upper tract system was adequately cleared of stone, the scope was backed down the length of the ureter inspecting along the way. There is no ureteral injury residual stone fragments appreciated. The stent was replaced on the side as well by back loading the safety wire over a rigid cystoscope. The stent was advanced all the way up to level of the renal pelvis and the wire was partially drawn until full coil was noted within the renal pelvis. There was then fully withdrawn until full coil was noted within the bladder. The bladder was then drained. The patient was then cleaned and dried, repositioned the supine position, reversed from anesthesia, taken the PACU in stable condition.  Plan: Patient will return in 7-10 days for cystoscopy, bilateral stent removal. He'll follow up thereafter in 4 weeks with a renal ultrasound prior.  Vanna Scotland, M.D.

## 2016-03-29 NOTE — Interval H&P Note (Signed)
History and Physical Interval Note:  03/29/2016 8:45 AM  [name]  has presented today for surgery, with the diagnosis of NEPHROLITHIASIS  The various methods of treatment have been discussed with the patient and family. After consideration of risks, benefits and other options for treatment, the patient has consented to  Procedure(s): URETEROSCOPY WITH HOLMIUM LASER LITHOTRIPSY (Bilateral) CYSTOSCOPY WITH STENT REPLACEMENT (Bilateral) as a surgical intervention .  The patient's history has been reviewed, patient examined, no change in status, stable for surgery.  I have reviewed the patient's chart and labs.  Questions were answered to the patient's satisfaction.    RRR CTAB   [name]

## 2016-03-29 NOTE — H&P (View-Only) (Signed)
03/24/2016 10:16 AM   [name] 1970/08/29 [id number]  Referring provider: Valera Castle, MD [address] [place], [place] [address]  Chief Complaint  Patient presents with  . Follow-up    3 weeks post Litho with KUB before    HPI: Patient is a 45 -year-old Caucasian male who presents today for follow up after undergoing ESWL for a right UPJ stone.    Background history Patient was a referral from their PCP, Olmedo, for gross hematuria.   CT Urogram noted a 9 mm calculus in the right renal collecting system most likely responsible for the patient's intermittent hematuria.  Multiple bilateral renal cysts. No worrisome renal lesions. No ureteral or bladder lesions.  Stable hyperdense/hemorrhagic cyst associate with the lower pole region of left kidney.   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 No complicating features.  Status post cholecystectomy.  No biliary dilatation.  A CT renal stone stone study performed two weeks later noted a 3 mm stone in the left ureter with left-sided hydronephrosis. In retrospect, the stone was present on the February 03, 2016 study. This stone has only migrated distally 1 or 2 cm in the interval.  Stones in of the right renal pelvis without active obstruction.  The stones could certainly cause intermittent obstruction given their location.  Mild atherosclerotic change in the abdominal aorta.  Patient attempted MET for the left UVJ stone.  It was felt that it passed and he underwent right ESWL for the right UPJ stone.  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   He then develped pyelonephritis and was admitted for IV antibioitcs.  Today, he complains of gross hematuria, dysuria and nocturia.  He is also fatigued from the course of his stone treaments.  He hsa not had fevers, chills, nausea or vomiting.  He is currently on Ceftin.    KUB today notes  bilateral ureteral stents in place and possible bilateral ureteral stones.  He is currently scheduled for cystoscopy with bilateral URS/LL/stent exchange.    PMH: Past Medical History:  Diagnosis Date  . Acid reflux   . Hematuria   . Kidney stones   . Sleep apnea   . Urolithiasis     Surgical History: Past Surgical History:  Procedure Laterality Date  . CYSTOSCOPY W/ RETROGRADES Bilateral 03/05/2016   Procedure: CYSTOSCOPY WITH RETROGRADE PYELOGRAM;  Surgeon: Nickie Retort, MD;  Location: [facility];  Service: Urology;  Laterality: Bilateral;  . CYSTOSCOPY WITH STENT PLACEMENT Bilateral 03/05/2016   Procedure: CYSTOSCOPY WITH STENT PLACEMENT;  Surgeon: Nickie Retort, MD;  Location: [facility];  Service: Urology;  Laterality: Bilateral;  . EXTRACORPOREAL SHOCK WAVE LITHOTRIPSY Right 03/03/2016   Procedure: EXTRACORPOREAL SHOCK WAVE LITHOTRIPSY (ESWL);  Surgeon: Hollice Espy, MD;  Location: [facility];  Service: Urology;  Laterality: Right;  . KNEE SURGERY    . LAPAROSCOPIC GASTRIC SLEEVE RESECTION      Home Medications:    Medication List       Accurate as of 03/24/16 10:16 AM. Always use your most recent med list.          cefUROXime 500 MG tablet Commonly known as:  CEFTIN Take 1 tablet (500 mg total) by mouth 2 (two) times daily with a meal.   ciprofloxacin 500 MG tablet Commonly known as:  CIPRO Take 500 mg by mouth 2 (two) times daily.   omeprazole 20 MG capsule Commonly  known as:  PRILOSEC Take 20 mg by mouth at bedtime.   ondansetron 4 MG disintegrating tablet Commonly known as:  ZOFRAN ODT Take 1 tablet (4 mg total) by mouth every 8 (eight) hours as needed for nausea or vomiting.   oxyCODONE-acetaminophen 5-325 MG tablet Commonly known as:  ROXICET Take 1 tablet by mouth every 4 (four) hours as needed for severe pain.   tamsulosin 0.4 MG Caps capsule Commonly known as:  FLOMAX Take 1 capsule (0.4 mg total) by mouth daily after breakfast.        Allergies:  Allergies  Allergen Reactions  . Penicillins Itching and Rash    Has patient had a PCN reaction causing immediate rash, facial/tongue/throat swelling, SOB or lightheadedness with hypotension: No Has patient had a PCN reaction causing severe rash involving mucus membranes or skin necrosis: No Has patient had a PCN reaction that required hospitalization No Has patient had a PCN reaction occurring within the last 10 years: No If all of the above answers are "NO", then may proceed with Cephalosporin use.    Family History: Family History  Problem Relation Age of Onset  . Prostate cancer Father   . Kidney disease Mother   . Kidney cancer Neg Hx     Social History:  reports that he has never smoked. He has never used smokeless tobacco. He reports that he drinks alcohol. He reports that he does not use drugs.  ROS: UROLOGY Frequent Urination?: No Hard to postpone urination?: No Burning/pain with urination?: Yes Get up at night to urinate?: Yes Leakage of urine?: No Urine stream starts and stops?: No Trouble starting stream?: No Do you have to strain to urinate?: No Blood in urine?: Yes Urinary tract infection?: No Sexually transmitted disease?: No Injury to kidneys or bladder?: No Painful intercourse?: No Weak stream?: No Erection problems?: No Penile pain?: No  Gastrointestinal Nausea?: No Vomiting?: No Indigestion/heartburn?: No Diarrhea?: No Constipation?: No  Constitutional Fever: No Night sweats?: No Weight loss?: No Fatigue?: No  Skin Skin rash/lesions?: No Itching?: No  Eyes Blurred vision?: No Double vision?: No  Ears/Nose/Throat Sore throat?: No Sinus problems?: No  Hematologic/Lymphatic Swollen glands?: No Easy bruising?: No  Cardiovascular Leg swelling?: No Chest pain?: No  Respiratory Cough?: No Shortness of breath?: No  Endocrine Excessive thirst?: No  Musculoskeletal Back pain?: No Joint pain?:  No  Neurological Headaches?: No Dizziness?: No  Psychologic Depression?: No Anxiety?: No  Physical Exam: BP 124/84   Pulse 90   Ht _0  (1.676 m)   Wt 189 lb 6.4 oz (85.9 kg)   BMI 30.57 kg/m   Constitutional: Well nourished. Alert and oriented, No acute distress. HEENT: [place] AT, moist mucus membranes. Trachea midline, no masses. Cardiovascular: No clubbing, cyanosis, or edema. Respiratory: Normal respiratory effort, no increased work of breathing. GI: Abdomen is soft, non tender, non distended, no abdominal masses. Liver and spleen not palpable.  No hernias appreciated.  Stool sample for occult testing is not indicated.   GU: No CVA tenderness.  No bladder fullness or masses.   Skin: No rashes, bruises or suspicious lesions. Lymph: No cervical or inguinal adenopathy. Neurologic: Grossly intact, no focal deficits, moving all 4 extremities. Psychiatric: Normal mood and affect.  Laboratory Data: Lab Results  Component Value Date   WBC 7.9 03/18/2016   HGB 12.3 (L) 03/18/2016   HCT 36.9 (L) 03/18/2016   MCV 81.5 03/18/2016   PLT 291 03/18/2016    Lab Results  Component Value Date  CREATININE 0.80 03/16/2016     Lab Results  Component Value Date   HGBA1C 5.7 04/11/2013    Lab Results  Component Value Date   TSH 1.40 04/11/2013      Lab Results  Component Value Date   AST 12 (L) 03/16/2016   Lab Results  Component Value Date   ALT 11 (L) 03/16/2016     Urinalysis Results for orders placed or performed during the hospital encounter of 03/15/16  Urine C&S  Result Value Ref Range   Specimen Description URINE, RANDOM    Special Requests NONE    Culture (A)     >=100,000 COLONIES/mL STAPHYLOCOCCUS SPECIES (COAGULASE NEGATIVE)   Report Status 03/18/2016 FINAL    Organism ID, Bacteria STAPHYLOCOCCUS SPECIES (COAGULASE NEGATIVE) (A)       Susceptibility   Staphylococcus species (coagulase negative) - MIC*    CIPROFLOXACIN >=8 RESISTANT Resistant      GENTAMICIN <=0.5 SENSITIVE Sensitive     NITROFURANTOIN <=16 SENSITIVE Sensitive     OXACILLIN Value in next row Sensitive      <=0.25 SENSITIVEWARNING: For oxacillin-resistant S.aureus and coagulase-negative staphylococci (MRS), other beta-lactam agents, ie, penicillins, beta-lactam/beta-lactamase inhibitor combinations, cephems (with the exception of the cephalosporins with anti-MRSA activity), and carbapenems, may appear active in vitro, but are not effective clinically.  --CLSI, Vol.32 No.3, January 2012, pg 70.    TETRACYCLINE Value in next row Sensitive      <=0.25 SENSITIVEWARNING: For oxacillin-resistant S.aureus and coagulase-negative staphylococci (MRS), other beta-lactam agents, ie, penicillins, beta-lactam/beta-lactamase inhibitor combinations, cephems (with the exception of the cephalosporins with anti-MRSA activity), and carbapenems, may appear active in vitro, but are not effective clinically.  --CLSI, Vol.32 No.3, January 2012, pg 70.    VANCOMYCIN Value in next row Sensitive      <=0.25 SENSITIVEWARNING: For oxacillin-resistant S.aureus and coagulase-negative staphylococci (MRS), other beta-lactam agents, ie, penicillins, beta-lactam/beta-lactamase inhibitor combinations, cephems (with the exception of the cephalosporins with anti-MRSA activity), and carbapenems, may appear active in vitro, but are not effective clinically.  --CLSI, Vol.32 No.3, January 2012, pg 70.    TRIMETH/SULFA Value in next row Resistant      <=0.25 SENSITIVEWARNING: For oxacillin-resistant S.aureus and coagulase-negative staphylococci (MRS), other beta-lactam agents, ie, penicillins, beta-lactam/beta-lactamase inhibitor combinations, cephems (with the exception of the cephalosporins with anti-MRSA activity), and carbapenems, may appear active in vitro, but are not effective clinically.  --CLSI, Vol.32 No.3, January 2012, pg 70.    CLINDAMYCIN Value in next row Sensitive      <=0.25 SENSITIVEWARNING: For  oxacillin-resistant S.aureus and coagulase-negative staphylococci (MRS), other beta-lactam agents, ie, penicillins, beta-lactam/beta-lactamase inhibitor combinations, cephems (with the exception of the cephalosporins with anti-MRSA activity), and carbapenems, may appear active in vitro, but are not effective clinically.  --CLSI, Vol.32 No.3, January 2012, pg 70.    RIFAMPIN Value in next row Sensitive      <=0.25 SENSITIVEWARNING: For oxacillin-resistant S.aureus and coagulase-negative staphylococci (MRS), other beta-lactam agents, ie, penicillins, beta-lactam/beta-lactamase inhibitor combinations, cephems (with the exception of the cephalosporins with anti-MRSA activity), and carbapenems, may appear active in vitro, but are not effective clinically.  --CLSI, Vol.32 No.3, January 2012, pg 70.    * >=100,000 COLONIES/mL STAPHYLOCOCCUS SPECIES (COAGULASE NEGATIVE)  Blood Culture (routine x 2)  Result Value Ref Range   Specimen Description BLOOD RIGHT ASSIST CONTROL    Special Requests BOTTLES DRAWN AEROBIC AND ANAEROBIC 12CC    Culture NO GROWTH 5 DAYS    Report Status 03/21/2016 FINAL   Blood Culture (routine x  2)  Result Value Ref Range   Specimen Description BLOOD LEFT HAND    Special Requests BOTTLES DRAWN AEROBIC AND ANAEROBIC [place]    Culture NO GROWTH 5 DAYS    Report Status 03/21/2016 FINAL   Urinalysis complete, with microscopic- may I&O cath if menses  Result Value Ref Range   Color, Urine YELLOW (A) YELLOW   APPearance CLOUDY (A) CLEAR   Glucose, UA NEGATIVE NEGATIVE mg/dL   Bilirubin Urine NEGATIVE NEGATIVE   Ketones, ur NEGATIVE NEGATIVE mg/dL   Specific Gravity, Urine 1.015 1.005 - 1.030   Hgb urine dipstick 3+ (A) NEGATIVE   pH 8.0 5.0 - 8.0   Protein, ur >500 (A) NEGATIVE mg/dL   Nitrite POSITIVE (A) NEGATIVE   Leukocytes, UA 3+ (A) NEGATIVE   RBC / HPF TOO NUMEROUS TO COUNT 0 - 5 RBC/hpf   WBC, UA TOO NUMEROUS TO COUNT 0 - 5 WBC/hpf   Bacteria, UA FEW (A) NONE SEEN    Squamous Epithelial / LPF NONE SEEN NONE SEEN  Basic metabolic panel  Result Value Ref Range   Sodium 135 135 - 145 mmol/L   Potassium 3.7 3.5 - 5.1 mmol/L   Chloride 102 101 - 111 mmol/L   CO2 26 22 - 32 mmol/L   Glucose, Bld 104 (H) 65 - 99 mg/dL   BUN 11 6 - 20 mg/dL   Creatinine, Ser 0.99 0.61 - 1.24 mg/dL   Calcium 9.2 8.9 - 10.3 mg/dL   GFR calc non Af Amer >60 >60 mL/min   GFR calc Af Amer >60 >60 mL/min   Anion gap 7 5 - 15  CBC  Result Value Ref Range   WBC 11.5 (H) 3.8 - 10.6 K/uL   RBC 5.13 4.40 - 5.90 MIL/uL   Hemoglobin 13.8 13.0 - 18.0 g/dL   HCT 41.4 40.0 - 52.0 %   MCV 80.7 80.0 - 100.0 fL   MCH 26.8 26.0 - 34.0 pg   MCHC 33.3 32.0 - 36.0 g/dL   RDW 12.7 11.5 - 14.5 %   Platelets 339 150 - 440 K/uL  Lactic acid, plasma  Result Value Ref Range   Lactic Acid, Venous 1.3 0.5 - 1.9 mmol/L  Lactic acid, plasma  Result Value Ref Range   Lactic Acid, Venous 1.0 0.5 - 1.9 mmol/L  Comprehensive metabolic panel  Result Value Ref Range   Sodium 134 (L) 135 - 145 mmol/L   Potassium 3.4 (L) 3.5 - 5.1 mmol/L   Chloride 108 101 - 111 mmol/L   CO2 21 (L) 22 - 32 mmol/L   Glucose, Bld 104 (H) 65 - 99 mg/dL   BUN 9 6 - 20 mg/dL   Creatinine, Ser 0.80 0.61 - 1.24 mg/dL   Calcium 8.0 (L) 8.9 - 10.3 mg/dL   Total Protein 6.0 (L) 6.5 - 8.1 g/dL   Albumin 3.1 (L) 3.5 - 5.0 g/dL   AST 12 (L) 15 - 41 U/L   ALT 11 (L) 17 - 63 U/L   Alkaline Phosphatase 54 38 - 126 U/L   Total Bilirubin 0.9 0.3 - 1.2 mg/dL   GFR calc non Af Amer >60 >60 mL/min   GFR calc Af Amer >60 >60 mL/min   Anion gap 5 5 - 15  CBC  Result Value Ref Range   WBC 11.9 (H) 3.8 - 10.6 K/uL   RBC 4.81 4.40 - 5.90 MIL/uL   Hemoglobin 12.7 (L) 13.0 - 18.0 g/dL   HCT 39.0 (L) 40.0 -  52.0 %   MCV 81.0 80.0 - 100.0 fL   MCH 26.4 26.0 - 34.0 pg   MCHC 32.6 32.0 - 36.0 g/dL   RDW 12.8 11.5 - 14.5 %   Platelets 290 150 - 440 K/uL  Glucose, capillary  Result Value Ref Range   Glucose-Capillary 103 (H) 65 -  99 mg/dL  CBC  Result Value Ref Range   WBC 7.9 3.8 - 10.6 K/uL   RBC 4.53 4.40 - 5.90 MIL/uL   Hemoglobin 12.3 (L) 13.0 - 18.0 g/dL   HCT 36.9 (L) 40.0 - 52.0 %   MCV 81.5 80.0 - 100.0 fL   MCH 27.2 26.0 - 34.0 pg   MCHC 33.3 32.0 - 36.0 g/dL   RDW 13.1 11.5 - 14.5 %   Platelets 291 150 - 440 K/uL    Pertinent Imaging: CLINICAL DATA:  History of bilateral kidney stones. Bilateral flank pain and hematuria. Post lithotripsy.  EXAM: ABDOMEN - 1 VIEW  COMPARISON:  None.  FINDINGS: The bowel gas pattern is normal. No radio-opaque calculi or other significant radiographic abnormality are seen.  Bilateral ureteral stents in satisfactory radiographic position. Large amount of stool overlies the renal shadows and prevents the evaluation for renal calculi.  Subtle few mm radiopaque densities seen alone midportion of bilateral ureteral catheters which may represent ureteral stones. Similar densities seen at the expected location of distal right ureter and possibly within the urinary bladder.  IMPRESSION: Possible few mm bilateral ureteral stones.  Nonobstructive bowel gas pattern.   Electronically Signed   By: Fidela Salisbury M.D.   On: 03/24/2016 10:02  Assessment & Plan:    Patient will undergo cystoscopy with bilateral ureteroscopy with laser lithotripsy with bilateral stent exchange for definitive treatment of his bilateral stone fragments.    1. S/P right ESWL  - patient with Steinstrasse after ESWL  - underwent stent placement- right ureteral stent in place  - scheduled for bilateral URS/LL/ureteral stent exchange  2. Left UVJ stone  - left ureteral stent in place  - scheduled for bilateral URS/LL/ureteral stent exchange  3. Left and Right hydronephrosis  - obtain RUS to ensure the hydronephrosis has resolved once stones have been cleared   4. Gross hematuria  - continue to monitor the patient's UA after the treatment/passage of the stone to  ensure the hematuria has resolved  - if hematuria persists, we will pursue a hematuria workup with CT Urogram and cystoscopy if appropriate  5. History of pyelonephritis  - continue the Ceftin  - Advised to contact our office or seek treatment in the ED if becomes febrile or pain/ vomiting are difficult control in order to arrange for emergent/urgent intervention   No Follow-up on file.  These notes generated with voice recognition software. I apologize for typographical errors.  Zara Council, [facility] [address] [place], [place] [address] [phone]

## 2016-03-29 NOTE — Telephone Encounter (Signed)
Note entered in error

## 2016-03-29 NOTE — Anesthesia Postprocedure Evaluation (Signed)
Anesthesia Post Note  Patient: Lucila Maineeter W Keeven  Procedure(s) Performed: Procedure(s) (LRB): URETEROSCOPY WITH HOLMIUM LASER LITHOTRIPSY (Right) CYSTOSCOPY WITH STENT REPLACEMENT (Bilateral) URETEROSCOPY (Left)  Patient location during evaluation: PACU Anesthesia Type: General Level of consciousness: awake and alert Pain management: pain level controlled Vital Signs Assessment: post-procedure vital signs reviewed and stable Respiratory status: spontaneous breathing and respiratory function stable Cardiovascular status: stable Anesthetic complications: no    Last Vitals:  Vitals:   03/29/16 0848 03/29/16 1051  BP: 133/82 119/84  Pulse: 66 83  Resp: 16 16  Temp: 36.9 C 36.3 C    Last Pain:  Vitals:   03/29/16 1051  TempSrc:   PainSc: Asleep                 Fynlee Rowlands K

## 2016-03-29 NOTE — Discharge Instructions (Signed)
You have a ureteral stent in place.  This is a tube that extends from your kidney to your bladder.  This may cause urinary bleeding, burning with urination, and urinary frequency.  Please call our office or present to the ED if you develop fevers >101 or pain which is not able to be controlled with oral pain medications.  You may be given either Flomax and/ or ditropan to help with bladder spasms and stent pain in addition to pain medications.   ° °[facility] °[address], [address] °[place], [place] [address] °[phone] °

## 2016-03-29 NOTE — Anesthesia Procedure Notes (Signed)
Procedure Name: Intubation Date/Time: 03/29/2016 9:33 AM Performed by: Almeta MonasFLETCHER, Aaralyn Kil Pre-anesthesia Checklist: Patient identified, Emergency Drugs available, Suction available and Patient being monitored Patient Re-evaluated:Patient Re-evaluated prior to inductionOxygen Delivery Method: Circle system utilized Preoxygenation: Pre-oxygenation with 100% oxygen Intubation Type: IV induction Ventilation: Mask ventilation without difficulty Laryngoscope Size: Mac and 3 Grade View: Grade I Tube type: Oral Tube size: 7.0 mm Number of attempts: 1 Airway Equipment and Method: Stylet Placement Confirmation: ETT inserted through vocal cords under direct vision,  positive ETCO2,  CO2 detector and breath sounds checked- equal and bilateral Secured at: 21 cm Tube secured with: Tape Dental Injury: Teeth and Oropharynx as per pre-operative assessment

## 2016-03-29 NOTE — Anesthesia Preprocedure Evaluation (Signed)
Anesthesia Evaluation  Patient identified by MRN, date of birth, ID band Patient awake    Reviewed: Allergy & Precautions, NPO status , Patient's Chart, lab work & pertinent test results  History of Anesthesia Complications Negative for: history of anesthetic complications  Airway Mallampati: II       Dental   Pulmonary sleep apnea ,           Cardiovascular negative cardio ROS       Neuro/Psych negative neurological ROS  negative psych ROS   GI/Hepatic negative GI ROS, Neg liver ROS, GERD  Medicated and Controlled,  Endo/Other  negative endocrine ROS  Renal/GU Renal disease (stones)negative Renal ROS  negative genitourinary   Musculoskeletal   Abdominal   Peds negative pediatric ROS (+)  Hematology negative hematology ROS (+)   Anesthesia Other Findings   Reproductive/Obstetrics                             Anesthesia Physical Anesthesia Plan  ASA: II  Anesthesia Plan: General   Post-op Pain Management:    Induction: Intravenous  Airway Management Planned: Oral ETT  Additional Equipment:   Intra-op Plan:   Post-operative Plan:   Informed Consent: I have reviewed the patients History and Physical, chart, labs and discussed the procedure including the risks, benefits and alternatives for the proposed anesthesia with the patient or authorized representative who has indicated his/her understanding and acceptance.     Plan Discussed with:   Anesthesia Plan Comments:         Anesthesia Quick Evaluation

## 2016-04-05 ENCOUNTER — Encounter: Payer: Self-pay | Admitting: Urology

## 2016-04-05 ENCOUNTER — Ambulatory Visit (INDEPENDENT_AMBULATORY_CARE_PROVIDER_SITE_OTHER): Payer: BLUE CROSS/BLUE SHIELD | Admitting: Urology

## 2016-04-05 VITALS — BP 116/79 | HR 92 | Ht 66.0 in | Wt 190.3 lb

## 2016-04-05 DIAGNOSIS — N200 Calculus of kidney: Secondary | ICD-10-CM | POA: Diagnosis not present

## 2016-04-05 LAB — MICROSCOPIC EXAMINATION: Epithelial Cells (non renal): NONE SEEN /hpf (ref 0–10)

## 2016-04-05 LAB — URINALYSIS, COMPLETE
BILIRUBIN UA: NEGATIVE
GLUCOSE, UA: NEGATIVE
NITRITE UA: NEGATIVE
Specific Gravity, UA: 1.025 (ref 1.005–1.030)
UUROB: 0.2 mg/dL (ref 0.2–1.0)
pH, UA: 5.5 (ref 5.0–7.5)

## 2016-04-05 MED ORDER — CIPROFLOXACIN HCL 500 MG PO TABS: 500.0000 mg | ORAL_TABLET | Freq: Once | ORAL | Status: DC

## 2016-04-05 MED ORDER — LIDOCAINE HCL 2 % EX GEL
1.0000 "application " | Freq: Once | CUTANEOUS | Status: AC
  Administered 2016-04-05: 1 via URETHRAL

## 2016-04-05 NOTE — Progress Notes (Signed)
04/05/2016 11:10 AM   [name] 12-09-1970 [id number]  Referring provider: Dione Housekeeper, MD [address] [place], [place] [address]  Chief Complaint  Patient presents with  . Cysto Stent Removal    nephrolithoasis    HPI: S/p Bilateral URS. He has urgency, frequency, and dysuria with the stent in place    PMH: Past Medical History:  Diagnosis Date  . Acid reflux   . Hematuria   . Kidney stones   . Sleep apnea   . Urolithiasis     Surgical History: Past Surgical History:  Procedure Laterality Date  . CYSTOSCOPY W/ RETROGRADES Bilateral 03/05/2016   Procedure: CYSTOSCOPY WITH RETROGRADE PYELOGRAM;  Surgeon: Hildred Laser, MD;  Location: [facility];  Service: Urology;  Laterality: Bilateral;  . CYSTOSCOPY W/ URETERAL STENT PLACEMENT Bilateral 03/29/2016   Procedure: CYSTOSCOPY WITH STENT REPLACEMENT;  Surgeon: Vanna Scotland, MD;  Location: [facility];  Service: Urology;  Laterality: Bilateral;  . CYSTOSCOPY WITH STENT PLACEMENT Bilateral 03/05/2016   Procedure: CYSTOSCOPY WITH STENT PLACEMENT;  Surgeon: Hildred Laser, MD;  Location: [facility];  Service: Urology;  Laterality: Bilateral;  . EXTRACORPOREAL SHOCK WAVE LITHOTRIPSY Right 03/03/2016   Procedure: EXTRACORPOREAL SHOCK WAVE LITHOTRIPSY (ESWL);  Surgeon: Vanna Scotland, MD;  Location: [facility];  Service: Urology;  Laterality: Right;  . KNEE SURGERY    . LAPAROSCOPIC GASTRIC SLEEVE RESECTION    . URETEROSCOPY Left 03/29/2016   Procedure: URETEROSCOPY;  Surgeon: Vanna Scotland, MD;  Location: [facility];  Service: Urology;  Laterality: Left;  . URETEROSCOPY WITH HOLMIUM LASER LITHOTRIPSY Right 03/29/2016   Procedure: URETEROSCOPY WITH HOLMIUM LASER LITHOTRIPSY;  Surgeon: Vanna Scotland, MD;  Location: [facility];  Service: Urology;  Laterality: Right;    Home Medications:    Medication List       Accurate as of 04/05/16 11:10 AM. Always use your most recent med list.          omeprazole 20 MG  capsule Commonly known as:  PRILOSEC Take 20 mg by mouth at bedtime.   oxybutynin 5 MG tablet Commonly known as:  DITROPAN Take 1 tablet (5 mg total) by mouth every 8 (eight) hours as needed for bladder spasms.   oxyCODONE-acetaminophen 5-325 MG tablet Commonly known as:  ROXICET Take 1 tablet by mouth every 4 (four) hours as needed for severe pain.   oxyCODONE-acetaminophen 5-325 MG tablet Commonly known as:  PERCOCET Take 1-2 tablets by mouth every 4 (four) hours as needed for moderate pain or severe pain.   tamsulosin 0.4 MG Caps capsule Commonly known as:  FLOMAX Take 1 capsule (0.4 mg total) by mouth daily after breakfast.       Allergies:  Allergies  Allergen Reactions  . Penicillins Itching and Rash    Has patient had a PCN reaction causing immediate rash, facial/tongue/throat swelling, SOB or lightheadedness with hypotension: No Has patient had a PCN reaction causing severe rash involving mucus membranes or skin necrosis: No Has patient had a PCN reaction that required hospitalization No Has patient had a PCN reaction occurring within the last 10 years: No If all of the above answers are "NO", then may proceed with Cephalosporin use.    Family History: Family History  Problem Relation Age of Onset  . Prostate cancer Father   . Kidney disease Mother   . Kidney cancer Neg Hx     Social History:  reports that he has never smoked. He has never used smokeless tobacco. He reports that  he drinks alcohol. He reports that he does not use drugs.  ROS:                                        Physical Exam: There were no vitals taken for this visit.  Constitutional:  Alert and oriented, No acute distress. HEENT: [place] AT, moist mucus membranes.  Trachea midline, no masses. Cardiovascular: No clubbing, cyanosis, or edema. Respiratory: Normal respiratory effort, no increased work of breathing. GI: Abdomen is soft, nontender, nondistended, no  abdominal masses GU: No CVA tenderness.  Skin: No rashes, bruises or suspicious lesions. Lymph: No cervical or inguinal adenopathy. Neurologic: Grossly intact, no focal deficits, moving all 4 extremities. Psychiatric: Normal mood and affect.  Laboratory Data: Lab Results  Component Value Date   WBC 7.9 03/18/2016   HGB 12.3 (L) 03/18/2016   HCT 36.9 (L) 03/18/2016   MCV 81.5 03/18/2016   PLT 291 03/18/2016    Lab Results  Component Value Date   CREATININE 0.80 03/16/2016    No results found for: PSA  No results found for: TESTOSTERONE  Lab Results  Component Value Date   HGBA1C 5.7 04/11/2013    Urinalysis    Component Value Date/Time   COLORURINE YELLOW (A) 03/15/2016 1835   APPEARANCEUR CLOUDY (A) 03/15/2016 1835   APPEARANCEUR Clear 01/20/2016 1444   LABSPEC 1.015 03/15/2016 1835   LABSPEC 1.027 05/23/2013 1933   PHURINE 8.0 03/15/2016 1835   GLUCOSEU NEGATIVE 03/15/2016 1835   GLUCOSEU Negative 05/23/2013 1933   HGBUR 3+ (A) 03/15/2016 1835   BILIRUBINUR NEGATIVE 03/15/2016 1835   BILIRUBINUR Negative 01/20/2016 1444   BILIRUBINUR Negative 05/23/2013 1933   KETONESUR NEGATIVE 03/15/2016 1835   PROTEINUR >500 (A) 03/15/2016 1835   NITRITE POSITIVE (A) 03/15/2016 1835   LEUKOCYTESUR 3+ (A) 03/15/2016 1835   LEUKOCYTESUR Negative 01/20/2016 1444   LEUKOCYTESUR Negative 05/23/2013 1933    Pertinent Imaging: none    Cystoscopy Procedure Note  Patient identification was confirmed, informed consent was obtained, and patient was prepped using Betadine solution.  Lidocaine jelly was administered per urethral meatus.    Preoperative abx where received prior to procedure.     Pre-Procedure: - Inspection reveals a normal caliber ureteral meatus.  Procedure: The flexible cystoscope was introduced without difficulty - No urethral strictures/lesions are present. - Enlarged prostate  - Normal bladder neck - Bilateral ureteral orifices identified -  Bladder mucosa  reveals no ulcers, tumors, or lesions - No bladder stones - No trabeculation Using a grasper the right and left ureteral stents were removed.     Post-Procedure: - Patient tolerated the procedure well    Assessment & Plan:    1. Nephrolithiasis -rtc 1 month with renal US - Urinalysis, Complete - lidocaine (XYLOCAINE) 2 % jelly 1 application; Place 1 application into the urethra once. - ciprofloxacin (CIPRO) tablet 500 mg; Take 1 tablet (500 mg total) by mouth once.   No Follow-up on file.  Wilkie AyePatrick Logen Fowle, MD  [facility] [address], [address] [place], [place] [address] [phone]

## 2016-04-11 LAB — STONE ANALYSIS
CA PHOS CRY STONE QL IR: 10 %
Ca Oxalate,Dihydrate: 30 %
Ca Oxalate,Monohydr.: 60 %
Stone Weight KSTONE: 17 mg

## 2016-04-26 ENCOUNTER — Telehealth: Payer: Self-pay | Admitting: Urology

## 2016-04-26 DIAGNOSIS — N200 Calculus of kidney: Secondary | ICD-10-CM

## 2016-04-26 NOTE — Telephone Encounter (Signed)
Orders placed.

## 2016-04-26 NOTE — Telephone Encounter (Signed)
Need renal US order placed for this patient.    At last visit, Dr. Ronne BinningMcKenzie suggested that the patient follow up in 1 month with a renal US prior.  The order was not placed so patient has not been scheduled for US.

## 2016-05-03 ENCOUNTER — Ambulatory Visit: Payer: BLUE CROSS/BLUE SHIELD

## 2016-05-05 ENCOUNTER — Ambulatory Visit
Admission: RE | Admit: 2016-05-05 | Discharge: 2016-05-05 | Disposition: A | Discharge status: Home / Self Care | Payer: BLUE CROSS/BLUE SHIELD | Source: Ambulatory Visit | Attending: Urology | Admitting: Urology

## 2016-05-05 DIAGNOSIS — N281 Cyst of kidney, acquired: Secondary | ICD-10-CM | POA: Diagnosis not present

## 2016-05-05 DIAGNOSIS — N200 Calculus of kidney: Secondary | ICD-10-CM

## 2016-05-12 ENCOUNTER — Other Ambulatory Visit: Payer: Self-pay | Admitting: *Deleted

## 2016-05-12 DIAGNOSIS — N200 Calculus of kidney: Secondary | ICD-10-CM

## 2016-05-13 ENCOUNTER — Other Ambulatory Visit
Admission: RE | Admit: 2016-05-13 | Discharge: 2016-05-13 | Disposition: A | Discharge status: Home / Self Care | Payer: BLUE CROSS/BLUE SHIELD | Source: Ambulatory Visit | Attending: Urology | Admitting: Urology

## 2016-05-13 ENCOUNTER — Ambulatory Visit (INDEPENDENT_AMBULATORY_CARE_PROVIDER_SITE_OTHER): Payer: BLUE CROSS/BLUE SHIELD | Admitting: Urology

## 2016-05-13 ENCOUNTER — Encounter: Payer: Self-pay | Admitting: Urology

## 2016-05-13 VITALS — BP 141/80 | HR 68 | Ht 66.0 in | Wt 198.0 lb

## 2016-05-13 DIAGNOSIS — Z87448 Personal history of other diseases of urinary system: Secondary | ICD-10-CM

## 2016-05-13 DIAGNOSIS — Z87442 Personal history of urinary calculi: Secondary | ICD-10-CM | POA: Diagnosis not present

## 2016-05-13 DIAGNOSIS — N132 Hydronephrosis with renal and ureteral calculous obstruction: Secondary | ICD-10-CM | POA: Diagnosis not present

## 2016-05-13 DIAGNOSIS — R3129 Other microscopic hematuria: Secondary | ICD-10-CM | POA: Diagnosis not present

## 2016-05-13 DIAGNOSIS — N200 Calculus of kidney: Secondary | ICD-10-CM

## 2016-05-13 LAB — URINALYSIS COMPLETE WITH MICROSCOPIC (ARMC ONLY)
BACTERIA UA: NONE SEEN
GLUCOSE, UA: NEGATIVE mg/dL
Ketones, ur: NEGATIVE mg/dL
Nitrite: NEGATIVE
PROTEIN: 30 mg/dL — AB
SQUAMOUS EPITHELIAL / LPF: NONE SEEN
Specific Gravity, Urine: >1.03 — ABNORMAL HIGH (ref 1.005–1.030)
pH: 5.5 (ref 5.0–8.0)

## 2016-05-13 NOTE — Progress Notes (Signed)
05/13/2016 9:15 PM   [name] 30-Nov-1970 [id number]  Referring provider: Valera Castle, MD [address] [place],  [address]  Chief Complaint  Patient presents with  . Results    Renal US    HPI: Patient is a 45 -year-old Caucasian male who presents today for to discuss his renal ultrasound results after undergoing ESWL, bilateral ureteral stent placement and  URS/LL/ureteral stent exchange and subsequent bilateral stent removal.        Background history CT Urogram noted a 9 mm calculus in the right renal collecting system most likely responsible for the patient's intermittent hematuria.  Multiple bilateral renal cysts. No worrisome renal lesions. No ureteral or bladder lesions.  Stable hyperdense/hemorrhagic cyst associate with the lower pole region of left kidney.   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 No complicating features.  Status post cholecystectomy.  No biliary dilatation.  A CT renal stone study performed two weeks later noted a 3 mm stone in the left ureter with left-sided hydronephrosis. In retrospect, the stone was present on the February 03, 2016 study. This stone has only migrated distally 1 or 2 cm in the interval.  Stones in of the right renal pelvis without active obstruction.  The stones could certainly cause intermittent obstruction given  their location.  Mild atherosclerotic change in the abdominal aorta.  Patient attempted MET for the left UVJ stone.  It was felt that it passed and he underwent right ESWL for the right UPJ stone.  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   He then developed pyelonephritis and was admitted for IV antibiotics.  KUB today notes bilateral ureteral stents in place and possible bilateral ureteral stones.  He is currently scheduled for cystoscopy with bilateral URS/LL/stent exchange.  He also underwent bilateral stent  removal.  Today, patient states he currently has no symptoms. He specifically denies flank pain and gross hematuria.  His renal ultrasound completed on 05/05/2016 noted 1 cm simple cyst seen in upper pole of right kidney. 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 No other significant abnormality seen in the kidneys.  I have independently reviewed the films.  His UA today noted 6-30 RBC's and 6-30 WBC's.  Stone composition noted 30% calcium oxalate dihydrate, 60% calcium oxalate monohydrate and 10% calcium phosphate.   PMH: Past Medical History:  Diagnosis Date  . Acid reflux   . Hematuria   . Kidney stones   . Sleep apnea   . Urolithiasis     Surgical History: Past Surgical History:  Procedure Laterality Date  . CYSTOSCOPY W/ RETROGRADES Bilateral 03/05/2016   Procedure: CYSTOSCOPY WITH RETROGRADE PYELOGRAM;  Surgeon: Nickie Retort, MD;  Location: [facility];  Service: Urology;  Laterality: Bilateral;  . CYSTOSCOPY W/ URETERAL STENT PLACEMENT Bilateral 03/29/2016   Procedure: CYSTOSCOPY WITH STENT REPLACEMENT;  Surgeon: Hollice Espy, MD;  Location: [facility];  Service: Urology;  Laterality: Bilateral;  . CYSTOSCOPY WITH STENT PLACEMENT Bilateral 03/05/2016   Procedure: CYSTOSCOPY WITH STENT PLACEMENT;  Surgeon: Nickie Retort, MD;  Location: [facility];  Service: Urology;  Laterality: Bilateral;  . EXTRACORPOREAL SHOCK WAVE LITHOTRIPSY Right 03/03/2016   Procedure: EXTRACORPOREAL SHOCK WAVE LITHOTRIPSY (ESWL);  Surgeon: Hollice Espy, MD;  Location: [facility];  Service: Urology;  Laterality: Right;  . KNEE SURGERY    . LAPAROSCOPIC GASTRIC  SLEEVE RESECTION    . URETEROSCOPY Left 03/29/2016   Procedure: URETEROSCOPY;  Surgeon: Hollice Espy, MD;  Location: [facility];  Service: Urology;  Laterality: Left;  . URETEROSCOPY WITH HOLMIUM LASER LITHOTRIPSY Right 03/29/2016   Procedure: URETEROSCOPY WITH HOLMIUM  LASER LITHOTRIPSY;  Surgeon: Hollice Espy, MD;  Location: [facility];  Service: Urology;  Laterality: Right;    Home Medications:    Medication List       Accurate as of 05/13/16 11:59 PM. Always use your most recent med list.          cefUROXime 500 MG tablet Commonly known as:  CEFTIN Take 500 mg by mouth 2 (two) times daily with a meal.   omeprazole 20 MG capsule Commonly known as:  PRILOSEC Take 20 mg by mouth at bedtime.   oxybutynin 5 MG tablet Commonly known as:  DITROPAN Take 1 tablet (5 mg total) by mouth every 8 (eight) hours as needed for bladder spasms.   oxyCODONE-acetaminophen 5-325 MG tablet Commonly known as:  ROXICET Take 1 tablet by mouth every 4 (four) hours as needed for severe pain.   oxyCODONE-acetaminophen 5-325 MG tablet Commonly known as:  PERCOCET Take 1-2 tablets by mouth every 4 (four) hours as needed for moderate pain or severe pain.   tamsulosin 0.4 MG Caps capsule Commonly known as:  FLOMAX Take 1 capsule (0.4 mg total) by mouth daily after breakfast.       Allergies:  Allergies  Allergen Reactions  . Penicillins Itching and Rash    Has patient had a PCN reaction causing immediate rash, facial/tongue/throat swelling, SOB or lightheadedness with hypotension: No Has patient had a PCN reaction causing severe rash involving mucus membranes or skin necrosis: No Has patient had a PCN reaction that required hospitalization No Has patient had a PCN reaction occurring within the last 10 years: No If all of the above answers are "NO", then may proceed with Cephalosporin use.    Family History: Family History  Problem Relation Age of Onset  . Prostate cancer Father   . Kidney disease Mother   . Kidney cancer Neg Hx     Social History:  reports that he has never smoked. He has never used smokeless tobacco. He reports that he drinks alcohol. He reports that he does not use drugs.  ROS: UROLOGY Frequent Urination?: No Hard to postpone  urination?: No Burning/pain with urination?: No Get up at night to urinate?: No Leakage of urine?: No Urine stream starts and stops?: No Trouble starting stream?: No Do you have to strain to urinate?: No Blood in urine?: No Urinary tract infection?: No Sexually transmitted disease?: No Injury to kidneys or bladder?: No Painful intercourse?: No Weak stream?: No Erection problems?: No Penile pain?: No  Gastrointestinal Nausea?: No Vomiting?: No Indigestion/heartburn?: No Diarrhea?: No Constipation?: No  Constitutional Fever: No Night sweats?: No Weight loss?: No Fatigue?: No  Skin Skin rash/lesions?: No Itching?: No  Eyes Blurred vision?: No Double vision?: No  Ears/Nose/Throat Sore throat?: No Sinus problems?: No  Hematologic/Lymphatic Swollen glands?: No Easy bruising?: No  Cardiovascular Leg swelling?: No Chest pain?: No  Respiratory Cough?: No Shortness of breath?: No  Endocrine Excessive thirst?: No  Musculoskeletal Back pain?: No Joint pain?: No  Neurological Headaches?: No Dizziness?: No  Psychologic Depression?: No Anxiety?: No  Physical Exam: BP (!) 141/80   Pulse 68   Ht '5\' 6"'  (1.676 m)   Wt 198 lb (89.8 kg)   BMI 31.96 kg/m   Constitutional:  Well nourished. Alert and oriented, No acute distress. HEENT: [place] AT, moist mucus membranes. Trachea midline, no masses. Cardiovascular: No clubbing, cyanosis, or edema. Respiratory: Normal respiratory effort, no increased work of breathing. Skin: No rashes, bruises or suspicious lesions. Lymph: No cervical or inguinal adenopathy. Neurologic: Grossly intact, no focal deficits, moving all 4 extremities. Psychiatric: Normal mood and affect.  Laboratory Data: Lab Results  Component Value Date   WBC 7.9 03/18/2016   HGB 12.3 (L) 03/18/2016   HCT 36.9 (L) 03/18/2016   MCV 81.5 03/18/2016   PLT 291 03/18/2016    Lab Results  Component Value Date   CREATININE 0.80 03/16/2016      Lab Results  Component Value Date   HGBA1C 5.7 04/11/2013    Lab Results  Component Value Date   TSH 1.40 04/11/2013      Lab Results  Component Value Date   AST 12 (L) 03/16/2016   Lab Results  Component Value Date   ALT 11 (L) 03/16/2016     Urinalysis Results for orders placed or performed during the hospital encounter of 05/13/16  Urine culture  Result Value Ref Range   Specimen Description URINE, CLEAN CATCH    Special Requests NONE    Culture (A)     30,000 COLONIES/mL CANDIDA PARAPSILOSIS Sent to [facility] for further susceptibility testing. Performed at [facility]    Report Status PENDING   Urinalysis complete, with microscopic [facility] only)  Result Value Ref Range   Color, Urine YELLOW YELLOW   APPearance CLEAR CLEAR   Glucose, UA NEGATIVE NEGATIVE mg/dL   Bilirubin Urine SMALL (A) NEGATIVE   Ketones, ur NEGATIVE NEGATIVE mg/dL   Specific Gravity, Urine >1.030 (H) 1.005 - 1.030   Hgb urine dipstick MODERATE (A) NEGATIVE   pH 5.5 5.0 - 8.0   Protein, ur 30 (A) NEGATIVE mg/dL   Nitrite NEGATIVE NEGATIVE   Leukocytes, UA TRACE (A) NEGATIVE   RBC / HPF 6-30 0 - 5 RBC/hpf   WBC, UA 6-30 0 - 5 WBC/hpf   Bacteria, UA NONE SEEN NONE SEEN   Squamous Epithelial / LPF NONE SEEN NONE SEEN   Mucous PRESENT    Ca Oxalate Crys, UA PRESENT   Miscellaneous [facility] test (send-out)  Result Value Ref Range   [facility] test code 182,220    [facility] test name URINE, CLEAN CATCH    Misc [facility] result COMMENT     Pertinent Imaging: CLINICAL DATA:  Kidney stones.  EXAM: RENAL / URINARY TRACT ULTRASOUND COMPLETE  COMPARISON:  Ultrasound of March 15, 2016.  FINDINGS: Right Kidney:  Length: 11 cm. 1 cm simple cyst is noted in upper pole. Echogenicity within normal limits. No mass or hydronephrosis visualized.  Left Kidney:  Length: 11.5 cm. Echogenicity within normal limits. No mass or hydronephrosis visualized. 2 cm septated cyst with  associated calcifications is seen in lower pole consistent with Bosniak type 2 lesion.  Bladder:  Appears normal for degree of bladder distention. Bilateral ureteral jets are noted.  IMPRESSION: 1 cm simple cyst seen in upper pole of right kidney. 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 No other significant abnormality seen in the kidneys.   Electronically Signed   By: Marijo Conception, M.D.   On: 05/05/2016 15:22    Assessment & Plan:    1. History of bilateral nephrolithiasis  - RUS did not demonstrate any hydronephrosis  - Stone composition  noted 30% calcium oxalate dihydrate, 60% calcium oxalate monohydrate and 10% calcium phosph - advised the patient to undergo a 24 Litholink once UA is clear  2. Left and Right hydronephrosis  - Renal ultrasound completed on 05/05/2016, no hydronephrosis is noted  3. Gross hematuria  - Microscopic hematuria seen on today's UA, his urine has been sent for culture  - if hematuria persists, we will pursue a hematuria workup with CT Urogram and cystoscopy if appropriate  4. History of pyelonephritis  - Resolved  Return for pending urine culture results.  These notes generated with voice recognition software. I apologize for typographical errors.  Zara Council, [facility] [address] [place], [place] [address] [phone]

## 2016-05-16 ENCOUNTER — Telehealth: Payer: Self-pay

## 2016-05-16 DIAGNOSIS — B3741 Candidal cystitis and urethritis: Secondary | ICD-10-CM

## 2016-05-16 MED ORDER — FLUCONAZOLE 100 MG PO TABS
100.0000 mg | ORAL_TABLET | Freq: Every day | ORAL | 0 refills | Status: DC
Start: 2016-05-16 — End: 2016-06-20

## 2016-05-16 NOTE — Telephone Encounter (Signed)
-----   Message from Harle BattiestShannon A McGowan, PA-C sent at 05/15/2016  2:37 PM EDT ----- We you please contact [facility] and asked them to add further identification and sensitivities to this yeast culture.

## 2016-05-16 NOTE — Telephone Encounter (Signed)
Spoke with Molli HazardMatthew, at [facility], Molli HazardMatthew stated that identification [place] be [place] at [place], but sensitivities have to be sent out. Per Molli HazardMatthew sensitivities can take up to 2 weeks for results.  Per Carollee HerterShannon pt should be started on diflucan. Spoke with pt in reference to diflucan and lab test. Pt voiced understanding. Medication sent to pharmacy.

## 2016-05-22 ENCOUNTER — Telehealth: Payer: Self-pay | Admitting: Urology

## 2016-05-22 NOTE — Telephone Encounter (Signed)
Would you please call the hospital and ask the results of the additional identification and sensitivities are ordered on his urine culture from October 13.

## 2016-05-23 NOTE — Telephone Encounter (Signed)
Spoke with micro who stated specimen is still in prelim status.

## 2016-06-03 ENCOUNTER — Telehealth: Payer: Self-pay | Admitting: Urology

## 2016-06-03 NOTE — Telephone Encounter (Signed)
Would you call and ask the hospital lab if his yeast indentification and sensitivities cultures are available?  It has been over two weeks.  He also needs to have his urine rechecked.

## 2016-06-05 LAB — MISC LABCORP TEST (SEND OUT): Labcorp test code: 182220

## 2016-06-06 LAB — URINE CULTURE: Culture: 30000 — AB

## 2016-06-06 NOTE — Telephone Encounter (Signed)
Lisa at microbiology stated yeast cultures were susceptible to diflucan. Do you still want another urine from pt?

## 2016-06-07 NOTE — Telephone Encounter (Signed)
No.  We are good.

## 2016-06-20 ENCOUNTER — Ambulatory Visit (INDEPENDENT_AMBULATORY_CARE_PROVIDER_SITE_OTHER): Payer: BLUE CROSS/BLUE SHIELD | Admitting: Urology

## 2016-06-20 ENCOUNTER — Encounter: Payer: Self-pay | Admitting: Urology

## 2016-06-20 VITALS — BP 117/81 | HR 69 | Ht 66.0 in | Wt 198.4 lb

## 2016-06-20 DIAGNOSIS — N132 Hydronephrosis with renal and ureteral calculous obstruction: Secondary | ICD-10-CM

## 2016-06-20 DIAGNOSIS — R3129 Other microscopic hematuria: Secondary | ICD-10-CM

## 2016-06-20 DIAGNOSIS — R350 Frequency of micturition: Secondary | ICD-10-CM

## 2016-06-20 DIAGNOSIS — N200 Calculus of kidney: Secondary | ICD-10-CM | POA: Diagnosis not present

## 2016-06-20 LAB — BLADDER SCAN AMB NON-IMAGING: SCAN RESULT: 0

## 2016-06-20 NOTE — Progress Notes (Signed)
06/20/2016 3:50 PM   [name] 1971-03-26 [id number]  Referring provider: Valera Castle, MD [address] [place], [place] [address]  Chief Complaint  Patient presents with  . Follow-up    history of Nephrolithiasis     HPI: Patient is a 45 year old Caucasian male who presents today complaining of the sudden urge to urinate and urinating at night.  Background history CT Urogram noted a 9 mm calculus in the right renal collecting system most likely responsible for the patient's intermittent hematuria.  Multiple bilateral renal cysts. No worrisome renal lesions. No ureteral or bladder lesions.  Stable hyperdense/hemorrhagic cyst associate with the lower pole region of left kidney.   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 No complicating features.  Status post cholecystectomy.  No biliary dilatation.  A CT renal stone study performed two weeks later noted a 3 mm stone in the left ureter with left-sided hydronephrosis. In retrospect, the stone was present on the February 03, 2016 study. This stone has only migrated distally 1 or 2 cm in the interval.  Stones in of the right renal pelvis without active obstruction.  The stones could certainly cause intermittent obstruction given  their location.  Mild atherosclerotic change in the abdominal aorta.  Patient attempted MET for the left UVJ stone.  It was felt that it passed and he underwent right ESWL for the right UPJ stone.  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   He then developed pyelonephritis and was admitted for IV antibiotics.  KUB today notes bilateral ureteral stents in place and possible bilateral ureteral stones.  He is currently scheduled for cystoscopy with bilateral URS/LL/stent exchange.  He also underwent bilateral stent removal.  His renal ultrasound completed on 05/05/2016 noted 1 cm simple cyst seen in upper pole of  right kidney. 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 No other significant abnormality seen in the kidneys.  I have independently reviewed the films.  Stone composition noted 30% calcium oxalate dihydrate, 60% calcium oxalate monohydrate and 10% calcium phosphate.  Today, he states for the last 2 weeks he's been experiencing urgency, mild urge incontinence and nocturia. He is denied gross hematuria, suprapubic pain or dysuria. He has not had fevers, chills, nausea or vomiting. His PVR today is 0 mL. His UA today is positive for 11-30 WBCs and greater than 30 RBCs and calcium oxalate crystals are present.   PMH: Past Medical History:  Diagnosis Date  . Acid reflux   . Hematuria   . Kidney stones   . Sleep apnea   . Urolithiasis     Surgical History: Past Surgical History:  Procedure Laterality Date  . CYSTOSCOPY W/ RETROGRADES Bilateral 03/05/2016   Procedure: CYSTOSCOPY WITH RETROGRADE PYELOGRAM;  Surgeon: Nickie Retort, MD;  Location: [facility];  Service: Urology;  Laterality: Bilateral;  . CYSTOSCOPY W/ URETERAL STENT PLACEMENT Bilateral 03/29/2016   Procedure: CYSTOSCOPY WITH STENT REPLACEMENT;  Surgeon: Hollice Espy, MD;  Location: [facility];  Service: Urology;  Laterality: Bilateral;  . CYSTOSCOPY WITH STENT PLACEMENT Bilateral 03/05/2016   Procedure: CYSTOSCOPY WITH STENT PLACEMENT;  Surgeon: Nickie Retort, MD;  Location: [facility];  Service: Urology;  Laterality: Bilateral;  . EXTRACORPOREAL SHOCK WAVE LITHOTRIPSY Right 03/03/2016   Procedure: EXTRACORPOREAL SHOCK WAVE LITHOTRIPSY (ESWL);  Surgeon: Hollice Espy, MD;  Location: [facility];  Service: Urology;  Laterality: Right;  . KNEE SURGERY    . LAPAROSCOPIC GASTRIC SLEEVE RESECTION    . URETEROSCOPY Left 03/29/2016   Procedure: URETEROSCOPY;  Surgeon: Hollice Espy, MD;  Location: [facility];  Service: Urology;  Laterality: Left;  . URETEROSCOPY  WITH HOLMIUM LASER LITHOTRIPSY Right 03/29/2016   Procedure: URETEROSCOPY WITH HOLMIUM LASER LITHOTRIPSY;  Surgeon: Hollice Espy, MD;  Location: [facility];  Service: Urology;  Laterality: Right;    Home Medications:    Medication List       Accurate as of 06/20/16 11:59 PM. Always use your most recent med list.          omeprazole 20 MG capsule Commonly known as:  PRILOSEC Take 20 mg by mouth at bedtime.       Allergies:  Allergies  Allergen Reactions  . Penicillins Itching and Rash    Has patient had a PCN reaction causing immediate rash, facial/tongue/throat swelling, SOB or lightheadedness with hypotension: No Has patient had a PCN reaction causing severe rash involving mucus membranes or skin necrosis: No Has patient had a PCN reaction that required hospitalization No Has patient had a PCN reaction occurring within the last 10 years: No If all of the above answers are "NO", then may proceed with Cephalosporin use.    Family History: Family History  Problem Relation Age of Onset  . Prostate cancer Father   . Kidney disease Mother   . Kidney cancer Neg Hx     Social History:  reports that he has never smoked. He has never used smokeless tobacco. He reports that he drinks alcohol. He reports that he does not use drugs.  ROS: UROLOGY Frequent Urination?: No Hard to postpone urination?: No Burning/pain with urination?: No Get up at night to urinate?: No Leakage of urine?: No Urine stream starts and stops?: No Trouble starting stream?: No Do you have to strain to urinate?: No Blood in urine?: No Urinary tract infection?: No Sexually transmitted disease?: No Injury to kidneys or bladder?: No Painful intercourse?: No Weak stream?: No Erection problems?: No Penile pain?: No  Gastrointestinal Nausea?: No Vomiting?: No Indigestion/heartburn?: No Diarrhea?: No Constipation?: No  Constitutional Fever: No Night sweats?: No Weight loss?: No Fatigue?:  No  Skin Skin rash/lesions?: No Itching?: No  Eyes Blurred vision?: No Double vision?: No  Ears/Nose/Throat Sore throat?: No Sinus problems?: No  Hematologic/Lymphatic Swollen glands?: No Easy bruising?: No  Cardiovascular Leg swelling?: No Chest pain?: No  Respiratory Cough?: No Shortness of breath?: No  Endocrine Excessive thirst?: No  Musculoskeletal Back pain?: No Joint pain?: No  Neurological Headaches?: No Dizziness?: No  Psychologic Depression?: No Anxiety?: No  Physical Exam: BP 117/81   Pulse 69   Ht '5\' 6"'  (1.676 m)   Wt 198 lb 6.4 oz (90 kg)   BMI 32.02 kg/m   Constitutional: Well nourished. Alert and oriented, No acute distress. HEENT: [employer] AT, moist mucus membranes. Trachea midline, no masses. Cardiovascular: No clubbing, cyanosis, or edema. Respiratory: Normal respiratory effort, no increased work of breathing. Skin: No rashes, bruises or suspicious lesions. Lymph: No cervical or inguinal adenopathy. Neurologic: Grossly intact, no focal deficits, moving all 4 extremities. Psychiatric: Normal mood and affect.  Laboratory Data: Lab Results  Component Value Date   WBC 7.9 03/18/2016   HGB 12.3 (L) 03/18/2016   HCT 36.9 (L) 03/18/2016   MCV 81.5 03/18/2016   PLT 291 03/18/2016    Lab Results  Component Value Date   CREATININE 0.80 03/16/2016  Lab Results  Component Value Date   HGBA1C 5.7 04/11/2013    Lab Results  Component Value Date   TSH 1.40 04/11/2013      Lab Results  Component Value Date   AST 12 (L) 03/16/2016   Lab Results  Component Value Date   ALT 11 (L) 03/16/2016     Urinalysis 11-30 WBC's, > 30 RBC's and calcium oxalate crystals.  See EPIC.   Results for orders placed or performed in visit on 06/20/16  CULTURE, URINE COMPREHENSIVE  Result Value Ref Range   Urine Culture, Comprehensive Final report (A)    Result 1 Yeast isolated. (A)    Result 2 Comment   Microscopic Examination   Result Value Ref Range   WBC, UA 11-30 (A) 0 - 5 /hpf   RBC, UA >30 (A) 0 - 2 /hpf   Epithelial Cells (non renal) 0-10 0 - 10 /hpf   Crystals Present (A) N/A   Crystal Type Calcium Oxalate N/A   Mucus, UA Present (A) Not Estab.   Bacteria, UA Few None seen/Few  Urinalysis, Complete  Result Value Ref Range   Specific Gravity, UA >1.030 (H) 1.005 - 1.030   pH, UA 5.5 5.0 - 7.5   Color, UA Yellow Yellow   Appearance Ur Cloudy (A) Clear   Leukocytes, UA Trace (A) Negative   Protein, UA Trace (A) Negative/Trace   Glucose, UA Negative Negative   Ketones, UA Negative Negative   RBC, UA 1+ (A) Negative   Bilirubin, UA Negative Negative   Urobilinogen, Ur 0.2 0.2 - 1.0 mg/dL   Nitrite, UA Negative Negative   Microscopic Examination See below:   Bladder Scan (Post Void Residual) in office  Result Value Ref Range   Scan Result 0     Pertinent Imaging: CLINICAL DATA:  Kidney stones.  EXAM: RENAL / URINARY TRACT ULTRASOUND COMPLETE  COMPARISON:  Ultrasound of March 15, 2016.  FINDINGS: Right Kidney:  Length: 11 cm. 1 cm simple cyst is noted in upper pole. Echogenicity within normal limits. No mass or hydronephrosis visualized.  Left Kidney:  Length: 11.5 cm. Echogenicity within normal limits. No mass or hydronephrosis visualized. 2 cm septated cyst with associated calcifications is seen in lower pole consistent with Bosniak type 2 lesion.  Bladder:  Appears normal for degree of bladder distention. Bilateral ureteral jets are noted.  IMPRESSION: 1 cm simple cyst seen in upper pole of right kidney. 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 No other significant  abnormality seen in the kidneys.   Electronically Signed   By: Marijo Conception, M.D.   On: 05/05/2016 15:22    Assessment & Plan:    1. History of bilateral nephrolithiasis  - RUS did not demonstrate any  hydronephrosis  - Stone composition noted 30% calcium oxalate dihydrate, 60% calcium oxalate monohydrate and 10% calcium phosph -  - advised the patient to undergo a 24 Litholink once UA is clear  - patient is still symptomatic, pursue a CT Renal stone study  2. Left and Right hydronephrosis  - Renal ultrasound completed on 05/05/2016, no hydronephrosis is noted  3. Microscopic hematuria  - Microscopic hematuria seen on today's UA, his urine has been sent for culture  - if hematuria persists, we will pursue a hematuria workup with CT Urogram and cystoscopy if appropriate  4. History of pyelonephritis  - Resolved  5. Frequency  - see above  Return for I will call patient with results.  These notes generated with voice recognition software. I apologize for typographical errors.  Zara Council, [facility] [address] [place], [place] [address] [phone]

## 2016-06-21 LAB — URINALYSIS, COMPLETE
Bilirubin, UA: NEGATIVE
Glucose, UA: NEGATIVE
Ketones, UA: NEGATIVE
NITRITE UA: NEGATIVE
PH UA: 5.5 (ref 5.0–7.5)
Specific Gravity, UA: >1.03 — ABNORMAL HIGH (ref 1.005–1.030)
UUROB: 0.2 mg/dL (ref 0.2–1.0)

## 2016-06-21 LAB — MICROSCOPIC EXAMINATION

## 2016-06-24 LAB — CULTURE, URINE COMPREHENSIVE

## 2016-06-27 ENCOUNTER — Telehealth: Payer: Self-pay

## 2016-06-27 ENCOUNTER — Ambulatory Visit
Admission: RE | Admit: 2016-06-27 | Discharge: 2016-06-27 | Disposition: A | Discharge status: Home / Self Care | Payer: BLUE CROSS/BLUE SHIELD | Source: Ambulatory Visit | Attending: Urology | Admitting: Urology

## 2016-06-27 DIAGNOSIS — R3129 Other microscopic hematuria: Secondary | ICD-10-CM | POA: Diagnosis present

## 2016-06-27 DIAGNOSIS — N132 Hydronephrosis with renal and ureteral calculous obstruction: Secondary | ICD-10-CM | POA: Insufficient documentation

## 2016-06-27 NOTE — Telephone Encounter (Signed)
Spoke with pt in reference to ucx results. Pt voiced understanding.  

## 2016-06-27 NOTE — Telephone Encounter (Signed)
-----   Message from Harle BattiestShannon A McGowan, PA-C sent at 06/24/2016  7:12 PM EST ----- Please notify the patient that his urine culture is negative.  It is just a skin contaminate.

## 2016-06-28 ENCOUNTER — Other Ambulatory Visit: Payer: Self-pay | Admitting: Urology

## 2016-06-28 MED ORDER — TAMSULOSIN HCL 0.4 MG PO CAPS: 0.4000 mg | ORAL_CAPSULE | Freq: Every day | ORAL | 3 refills | Status: DC

## 2016-07-04 ENCOUNTER — Other Ambulatory Visit: Payer: Self-pay | Admitting: Urology

## 2016-07-04 ENCOUNTER — Telehealth: Payer: Self-pay | Admitting: Urology

## 2016-07-04 DIAGNOSIS — N200 Calculus of kidney: Secondary | ICD-10-CM

## 2016-07-04 NOTE — Telephone Encounter (Signed)
Made follow up appt for this patient and advised him to have a KUB prior.    Can you please place the order for a KUB?

## 2016-07-06 ENCOUNTER — Ambulatory Visit
Admission: RE | Admit: 2016-07-06 | Discharge: 2016-07-06 | Disposition: A | Discharge status: Home / Self Care | Payer: BLUE CROSS/BLUE SHIELD | Source: Ambulatory Visit | Attending: Urology | Admitting: Urology

## 2016-07-06 ENCOUNTER — Encounter: Payer: Self-pay | Admitting: Urology

## 2016-07-06 ENCOUNTER — Ambulatory Visit (INDEPENDENT_AMBULATORY_CARE_PROVIDER_SITE_OTHER): Payer: BLUE CROSS/BLUE SHIELD | Admitting: Urology

## 2016-07-06 VITALS — BP 124/76 | HR 61 | Ht 66.0 in | Wt 198.8 lb

## 2016-07-06 DIAGNOSIS — N132 Hydronephrosis with renal and ureteral calculous obstruction: Secondary | ICD-10-CM | POA: Diagnosis not present

## 2016-07-06 DIAGNOSIS — R109 Unspecified abdominal pain: Secondary | ICD-10-CM | POA: Diagnosis present

## 2016-07-06 DIAGNOSIS — N200 Calculus of kidney: Secondary | ICD-10-CM

## 2016-07-06 DIAGNOSIS — Z87442 Personal history of urinary calculi: Secondary | ICD-10-CM

## 2016-07-06 DIAGNOSIS — N201 Calculus of ureter: Secondary | ICD-10-CM | POA: Diagnosis not present

## 2016-07-06 DIAGNOSIS — R3129 Other microscopic hematuria: Secondary | ICD-10-CM | POA: Diagnosis not present

## 2016-07-06 NOTE — Progress Notes (Signed)
07/06/2016 4:26 PM   [name] 07/16/1971 [id number]  Referring provider: Valera Castle, MD [address] [place], [place] [address]  Chief Complaint  Patient presents with  . Nephrolithiasis    2 WEEK FOLLOW UP AND KUB  . Urinary Frequency    HPI: Patient is a 45 year old Caucasian male who presents today complaining of the sudden urge to urinate and urinating at night.  Background history CT Urogram noted a 9 mm calculus in the right renal collecting system most likely responsible for the patient's intermittent hematuria.  Multiple bilateral renal cysts. No worrisome renal lesions. No ureteral or bladder lesions.  Stable hyperdense/hemorrhagic cyst associate with the lower pole region of left kidney.   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 No complicating features.  Status post cholecystectomy.  No biliary dilatation.  A CT renal stone study performed two weeks later noted a 3 mm stone in the left ureter with left-sided hydronephrosis. In retrospect, the stone was present on the February 03, 2016 study. This stone has only migrated distally 1 or 2 cm in the interval.  Stones in of the right renal pelvis without active obstruction.  The stones could certainly cause intermittent obstruction given  their location.  Mild atherosclerotic change in the abdominal aorta.  Patient attempted MET for the left UVJ stone.  It was felt that it passed and he underwent right ESWL for the right UPJ stone.  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   He then developed pyelonephritis and was admitted for IV antibiotics.  KUB today notes bilateral ureteral stents in place and possible bilateral ureteral stones.  He is currently scheduled for cystoscopy with bilateral URS/LL/stent exchange.  He also underwent bilateral stent removal.  His renal ultrasound completed on 05/05/2016 noted 1 cm simple  cyst seen in upper pole of right kidney. 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 No other significant abnormality seen in the kidneys.  I have independently reviewed the films.  Stone composition noted 30% calcium oxalate dihydrate, 60% calcium oxalate monohydrate and 10% calcium phosphate.  At his last visit, he stated for the last 2 weeks he's been experiencing urgency, mild urge incontinence and nocturia. He is denied gross hematuria, suprapubic pain or dysuria. He has not had fevers, chills, nausea or vomiting. His PVR was 0 mL. His UA today is positive for 11-30 WBCs and greater than 30 RBCs and calcium oxalate crystals are present.  Urine culture was negative. The patient then underwent a CT renal stone study that was performed on 06/27/2016 noted a moderate left hydronephrosis due to 3 mm calculus at the left ureteropelvic junction. 3 mm nonobstructive calculus also seen in dependent portion of left renal pelvis.  I have independently reviewed the films.  KUB taken today noted no ureteral stone.  I have independently reviewed the films.  He has not passed any fragments.  He is still having urinary frequency, but he has not had any right flank pain.  He feels that his testicle is being lightly squeezed.  He has not had any gross hematuria.  He denies any fevers, chills, nausea and vomiting.  UA today     PMH: Past Medical History:  Diagnosis Date  . Acid reflux   . Hematuria   . Kidney stones   . Sleep apnea   .  Urolithiasis     Surgical History: Past Surgical History:  Procedure Laterality Date  . CYSTOSCOPY W/ RETROGRADES Bilateral 03/05/2016   Procedure: CYSTOSCOPY WITH RETROGRADE PYELOGRAM;  Surgeon: Nickie Retort, MD;  Location: [facility];  Service: Urology;  Laterality: Bilateral;  . CYSTOSCOPY W/ URETERAL STENT PLACEMENT Bilateral 03/29/2016   Procedure: CYSTOSCOPY WITH STENT REPLACEMENT;   Surgeon: Hollice Espy, MD;  Location: [facility];  Service: Urology;  Laterality: Bilateral;  . CYSTOSCOPY WITH STENT PLACEMENT Bilateral 03/05/2016   Procedure: CYSTOSCOPY WITH STENT PLACEMENT;  Surgeon: Nickie Retort, MD;  Location: [facility];  Service: Urology;  Laterality: Bilateral;  . EXTRACORPOREAL SHOCK WAVE LITHOTRIPSY Right 03/03/2016   Procedure: EXTRACORPOREAL SHOCK WAVE LITHOTRIPSY (ESWL);  Surgeon: Hollice Espy, MD;  Location: [facility];  Service: Urology;  Laterality: Right;  . KNEE SURGERY    . LAPAROSCOPIC GASTRIC SLEEVE RESECTION    . URETEROSCOPY Left 03/29/2016   Procedure: URETEROSCOPY;  Surgeon: Hollice Espy, MD;  Location: [facility];  Service: Urology;  Laterality: Left;  . URETEROSCOPY WITH HOLMIUM LASER LITHOTRIPSY Right 03/29/2016   Procedure: URETEROSCOPY WITH HOLMIUM LASER LITHOTRIPSY;  Surgeon: Hollice Espy, MD;  Location: [facility];  Service: Urology;  Laterality: Right;    Home Medications:    Medication List       Accurate as of 07/06/16  4:26 PM. Always use your most recent med list.          omeprazole 20 MG capsule Commonly known as:  PRILOSEC Take 20 mg by mouth at bedtime.   tamsulosin 0.4 MG Caps capsule Commonly known as:  FLOMAX Take 1 capsule (0.4 mg total) by mouth daily.       Allergies:  Allergies  Allergen Reactions  . Penicillins Itching and Rash    Has patient had a PCN reaction causing immediate rash, facial/tongue/throat swelling, SOB or lightheadedness with hypotension: No Has patient had a PCN reaction causing severe rash involving mucus membranes or skin necrosis: No Has patient had a PCN reaction that required hospitalization No Has patient had a PCN reaction occurring within the last 10 years: No If all of the above answers are "NO", then may proceed with Cephalosporin use.    Family History: Family History  Problem Relation Age of Onset  . Prostate cancer Father   . Kidney disease Mother   . Kidney cancer Neg Hx    . Bladder Cancer Neg Hx     Social History:  reports that he has never smoked. He has never used smokeless tobacco. He reports that he drinks alcohol. He reports that he does not use drugs.  ROS: UROLOGY Frequent Urination?: No Hard to postpone urination?: No Burning/pain with urination?: No Get up at night to urinate?: No Leakage of urine?: No Urine stream starts and stops?: No Trouble starting stream?: No Do you have to strain to urinate?: No Blood in urine?: No Urinary tract infection?: No Sexually transmitted disease?: No Injury to kidneys or bladder?: No Painful intercourse?: No Weak stream?: No Erection problems?: No Penile pain?: No  Gastrointestinal Nausea?: No Vomiting?: No Indigestion/heartburn?: No Diarrhea?: No Constipation?: No  Constitutional Fever: No Night sweats?: No Weight loss?: No Fatigue?: No  Skin Skin rash/lesions?: No Itching?: No  Eyes Blurred vision?: No Double vision?: No  Ears/Nose/Throat Sore throat?: No Sinus problems?: No  Hematologic/Lymphatic Swollen glands?: No Easy bruising?: No  Cardiovascular Leg swelling?: No Chest pain?: No  Respiratory Cough?: No Shortness of breath?: No  Endocrine Excessive thirst?: No  Musculoskeletal Back  pain?: No Joint pain?: No  Neurological Headaches?: No Dizziness?: No  Psychologic Depression?: No Anxiety?: No  Physical Exam: BP 124/76   Pulse 61   Ht '5\' 6"'  (1.676 m)   Wt 198 lb 12.8 oz (90.2 kg)   BMI 32.09 kg/m   Constitutional: Well nourished. Alert and oriented, No acute distress. HEENT: [place] AT, moist mucus membranes. Trachea midline, no masses. Cardiovascular: No clubbing, cyanosis, or edema. Respiratory: Normal respiratory effort, no increased work of breathing. Skin: No rashes, bruises or suspicious lesions. Lymph: No cervical or inguinal adenopathy. Neurologic: Grossly intact, no focal deficits, moving all 4 extremities. Psychiatric: Normal mood and  affect.  Laboratory Data: Lab Results  Component Value Date   WBC 7.9 03/18/2016   HGB 12.3 (L) 03/18/2016   HCT 36.9 (L) 03/18/2016   MCV 81.5 03/18/2016   PLT 291 03/18/2016    Lab Results  Component Value Date   CREATININE 0.80 03/16/2016     Lab Results  Component Value Date   HGBA1C 5.7 04/11/2013    Lab Results  Component Value Date   TSH 1.40 04/11/2013      Lab Results  Component Value Date   AST 12 (L) 03/16/2016   Lab Results  Component Value Date   ALT 11 (L) 03/16/2016   Urinalysis  See EPIC.   Results for orders placed or performed in visit on 06/20/16  CULTURE, URINE COMPREHENSIVE  Result Value Ref Range   Urine Culture, Comprehensive Final report (A)    Result 1 Yeast isolated. (A)    Result 2 Comment   Microscopic Examination  Result Value Ref Range   WBC, UA 11-30 (A) 0 - 5 /hpf   RBC, UA >30 (A) 0 - 2 /hpf   Epithelial Cells (non renal) 0-10 0 - 10 /hpf   Crystals Present (A) N/A   Crystal Type Calcium Oxalate N/A   Mucus, UA Present (A) Not Estab.   Bacteria, UA Few None seen/Few  Urinalysis, Complete  Result Value Ref Range   Specific Gravity, UA >1.030 (H) 1.005 - 1.030   pH, UA 5.5 5.0 - 7.5   Color, UA Yellow Yellow   Appearance Ur Cloudy (A) Clear   Leukocytes, UA Trace (A) Negative   Protein, UA Trace (A) Negative/Trace   Glucose, UA Negative Negative   Ketones, UA Negative Negative   RBC, UA 1+ (A) Negative   Bilirubin, UA Negative Negative   Urobilinogen, Ur 0.2 0.2 - 1.0 mg/dL   Nitrite, UA Negative Negative   Microscopic Examination See below:   Bladder Scan (Post Void Residual) in office  Result Value Ref Range   Scan Result 0     Pertinent Imaging: CLINICAL DATA:  Microscopic hematuria.  Nephrolithiasis.  EXAM: CT ABDOMEN AND PELVIS WITHOUT CONTRAST  TECHNIQUE: Multidetector CT imaging of the abdomen and pelvis was performed following the standard protocol without IV contrast.  COMPARISON:   03/04/2016  FINDINGS: Lower chest: No acute findings.  Hepatobiliary: No masses visualized on this unenhanced exam. Prior cholecystectomy noted. No evidence of biliary dilatation.  Pancreas: No mass or inflammatory process visualized on this unenhanced exam.  Spleen:  Within normal limits in size.  Adrenals/Urinary tract: Moderate left hydronephrosis is seen due to 3 mm calculus at the ureteropelvic junction. A nonobstructive calculus measuring 3 mm is also seen in the dependent portion of the left renal pelvis. Probable left renal cysts remain stable. No evidence of right-sided hydronephrosis or ureteral calculi. No bladder calculi identified.  Stomach/Bowel: Previous sleeve gastrectomy. No evidence of obstruction, inflammatory process, or abnormal fluid collections.  Vascular/Lymphatic: No pathologically enlarged lymph nodes identified. No evidence of abdominal aortic aneurysm.  Reproductive:  No mass or other significant abnormality.  Other:  None.  Musculoskeletal:  No suspicious bone lesions identified.  IMPRESSION: Moderate left hydronephrosis due to 3 mm calculus at the left ureteropelvic junction.  3 mm nonobstructive calculus also seen in dependent portion of left renal pelvis.   Electronically Signed   By: Earle Gell M.D.   On: 06/27/2016 17:37      CLINICAL DATA:  Kidney stones  EXAM: ABDOMEN - 1 VIEW  COMPARISON:  06/27/16  FINDINGS: The bowel gas pattern is normal. No radio-opaque calculi or other significant radiographic abnormality are seen. Postcholecystectomy surgical clips.  IMPRESSION: Normal small bowel gas pattern. No radiopaque calculi are identified.   Electronically Signed   By: Lahoma Crocker M.D.   On: 07/06/2016 16:40   Assessment & Plan:    1. Left ureteral stone  - patient has not passed any fragments  - KUB did not identify any stones  - UA is positive 11-30 WBC's and > 30 RBC's and calcium  oxalate crystals  - urine sent for culture  - patient will follow up in one week with KUB and UA  2. Left hydronephrosis  - Patient was found to have left hydronephrosis due to an ureteral stone  - Obtain RUS one month after passing stone  3. History of bilateral nephrolithiasis  - Stone composition noted 30% calcium oxalate dihydrate, 60% calcium oxalate monohydrate and 10% calcium phosphate  - advised the patient to undergo a 24 Litholink once UA is clear   4. Microscopic hematuria  - still positive for > 30 RBC's  - if hematuria persists, we will pursue a hematuria workup with CT Urogram and cystoscopy if appropriate   Return in about 1 week (around 07/13/2016) for KUB and UA.  These notes generated with voice recognition software. I apologize for typographical errors.  Zara Council, [facility] [address], [place], [place] [address] [phone]

## 2016-07-07 LAB — URINALYSIS, COMPLETE
Bilirubin, UA: NEGATIVE
Glucose, UA: NEGATIVE
KETONES UA: NEGATIVE
NITRITE UA: NEGATIVE
PH UA: 5 (ref 5.0–7.5)
Urobilinogen, Ur: 0.2 mg/dL (ref 0.2–1.0)

## 2016-07-07 LAB — MICROSCOPIC EXAMINATION

## 2016-07-10 LAB — CULTURE, URINE COMPREHENSIVE

## 2016-07-11 ENCOUNTER — Ambulatory Visit
Admission: RE | Admit: 2016-07-11 | Discharge: 2016-07-11 | Disposition: A | Discharge status: Home / Self Care | Payer: BLUE CROSS/BLUE SHIELD | Source: Ambulatory Visit | Attending: Urology | Admitting: Urology

## 2016-07-11 ENCOUNTER — Encounter: Payer: Self-pay | Admitting: Urology

## 2016-07-11 ENCOUNTER — Ambulatory Visit (INDEPENDENT_AMBULATORY_CARE_PROVIDER_SITE_OTHER): Payer: BLUE CROSS/BLUE SHIELD | Admitting: Urology

## 2016-07-11 ENCOUNTER — Encounter: Payer: Self-pay | Admitting: Radiology

## 2016-07-11 ENCOUNTER — Other Ambulatory Visit: Payer: Self-pay

## 2016-07-11 ENCOUNTER — Telehealth: Payer: Self-pay

## 2016-07-11 VITALS — BP 116/73 | HR 72 | Ht 66.0 in | Wt 199.4 lb

## 2016-07-11 DIAGNOSIS — R3129 Other microscopic hematuria: Secondary | ICD-10-CM | POA: Diagnosis not present

## 2016-07-11 DIAGNOSIS — N132 Hydronephrosis with renal and ureteral calculous obstruction: Secondary | ICD-10-CM | POA: Diagnosis not present

## 2016-07-11 DIAGNOSIS — Z87442 Personal history of urinary calculi: Secondary | ICD-10-CM | POA: Diagnosis not present

## 2016-07-11 DIAGNOSIS — N200 Calculus of kidney: Secondary | ICD-10-CM | POA: Insufficient documentation

## 2016-07-11 DIAGNOSIS — N201 Calculus of ureter: Secondary | ICD-10-CM

## 2016-07-11 LAB — URINALYSIS, COMPLETE
BILIRUBIN UA: NEGATIVE
GLUCOSE, UA: NEGATIVE
Ketones, UA: NEGATIVE
Nitrite, UA: NEGATIVE
SPEC GRAV UA: 1.025 (ref 1.005–1.030)
Urobilinogen, Ur: 0.2 mg/dL (ref 0.2–1.0)
pH, UA: 5 (ref 5.0–7.5)

## 2016-07-11 LAB — MICROSCOPIC EXAMINATION: WBC, UA: >30 /hpf — AB (ref 0–?)

## 2016-07-11 NOTE — Telephone Encounter (Signed)
-----   Message from Harle BattiestShannon A McGowan, PA-C sent at 07/10/2016  4:58 PM EST ----- Would you please add species identification and sensitivities to his culture.

## 2016-07-11 NOTE — Telephone Encounter (Signed)
Species ID added.

## 2016-07-11 NOTE — Progress Notes (Signed)
  07/11/2016 4:47 PM   [name] 11/08/1970 [id number]  Referring provider: Mario Ernesto Olmedo, MD [address] [place] [address] [place], [place] [address]  Chief Complaint  Patient presents with  . Follow-up    1 week ureteral stone L hydronephrosis    HPI: Patient is a 45-year-old Caucasian male who presents today for one week recheck with KUB, UA and symptom recheck for a 3 mm left UVJ stone.    Background history CT Urogram noted a 9 mm calculus in the right renal collecting system most likely responsible for the patient's intermittent hematuria.  Multiple bilateral renal cysts. No worrisome renal lesions. No ureteral or bladder lesions.  Stable hyperdense/hemorrhagic cyst associate with the lower pole region of left kidney.   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 No complicating features.  Status post cholecystectomy.  No biliary dilatation.  A CT renal stone study performed two weeks later noted a 3 mm stone in the left ureter with left-sided hydronephrosis. In retrospect, the stone was present on the February 03, 2016 study. This stone has only migrated distally 1 or 2 cm in the interval.  Stones in of the right renal pelvis without active obstruction.  The stones could certainly cause intermittent obstruction given  their location.  Mild atherosclerotic change in the abdominal aorta.  Patient attempted MET for the left UVJ stone.  It was felt that it passed and he underwent right ESWL for the right UPJ stone.  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   He then developed pyelonephritis and was admitted for IV antibiotics.  KUB today notes bilateral ureteral stents in place and possible bilateral ureteral stones.  He is currently scheduled for cystoscopy with bilateral URS/LL/stent exchange.  He also underwent bilateral stent removal.  His renal ultrasound completed on 05/05/2016 noted 1 cm simple  cyst seen in upper pole of right kidney. 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 No other significant abnormality seen in the kidneys.  I have independently reviewed the films.  Stone composition noted 30% calcium oxalate dihydrate, 60% calcium oxalate monohydrate and 10% calcium phosphate.  At his last visit, he stated for the last 2 weeks he's been experiencing urgency, mild urge incontinence and nocturia.  He is denied gross hematuria, suprapubic pain or dysuria.  He has not had fevers, chills, nausea or vomiting.  His PVR was 0 mL. His UA was positive for 11-30 WBCs and greater than 30 RBCs and calcium oxalate crystals are present.  Urine culture was negative. The patient then underwent a CT renal stone study that was performed on 06/27/2016 noted a moderate left hydronephrosis due to 3 mm calculus at the left ureteropelvic junction. 3 mm nonobstructive calculus also seen in dependent portion of left renal pelvis.  I have independently reviewed the films.  KUB taken on 07/06/2016 noted no ureteral stone.    KUB taken today did not note any ureteral stones.  I have independently reviewed the films.  He has not passed any fragments.  He is still having urinary frequency and he is having left flank pain.  He has not had any gross hematuria.  He denies any fevers, chills, nausea and vomiting.  UA today demonstrates > 30 WBC's and 11-30 RBC's.  His urine culture was positive for yeast.      PMH: Past Medical History:    Diagnosis Date  . Acid reflux   . Hematuria   . Kidney stones   . Sleep apnea   . Urolithiasis     Surgical History: Past Surgical History:  Procedure Laterality Date  . CYSTOSCOPY W/ RETROGRADES Bilateral 03/05/2016   Procedure: CYSTOSCOPY WITH RETROGRADE PYELOGRAM;  Surgeon: Nickie Retort, MD;  Location: [facility];  Service: Urology;  Laterality: Bilateral;  . CYSTOSCOPY W/ URETERAL STENT  PLACEMENT Bilateral 03/29/2016   Procedure: CYSTOSCOPY WITH STENT REPLACEMENT;  Surgeon: Hollice Espy, MD;  Location: [facility];  Service: Urology;  Laterality: Bilateral;  . CYSTOSCOPY WITH STENT PLACEMENT Bilateral 03/05/2016   Procedure: CYSTOSCOPY WITH STENT PLACEMENT;  Surgeon: Nickie Retort, MD;  Location: [facility];  Service: Urology;  Laterality: Bilateral;  . EXTRACORPOREAL SHOCK WAVE LITHOTRIPSY Right 03/03/2016   Procedure: EXTRACORPOREAL SHOCK WAVE LITHOTRIPSY (ESWL);  Surgeon: Hollice Espy, MD;  Location: [facility];  Service: Urology;  Laterality: Right;  . KNEE SURGERY    . LAPAROSCOPIC GASTRIC SLEEVE RESECTION    . URETEROSCOPY Left 03/29/2016   Procedure: URETEROSCOPY;  Surgeon: Hollice Espy, MD;  Location: [facility];  Service: Urology;  Laterality: Left;  . URETEROSCOPY WITH HOLMIUM LASER LITHOTRIPSY Right 03/29/2016   Procedure: URETEROSCOPY WITH HOLMIUM LASER LITHOTRIPSY;  Surgeon: Hollice Espy, MD;  Location: [facility];  Service: Urology;  Laterality: Right;    Home Medications:    Medication List       Accurate as of 07/11/16  4:47 PM. Always use your most recent med list.          omeprazole 20 MG capsule Commonly known as:  PRILOSEC Take 20 mg by mouth at bedtime.   tamsulosin 0.4 MG Caps capsule Commonly known as:  FLOMAX Take 1 capsule (0.4 mg total) by mouth daily.       Allergies:  Allergies  Allergen Reactions  . Penicillins Itching and Rash    Has patient had a PCN reaction causing immediate rash, facial/tongue/throat swelling, SOB or lightheadedness with hypotension: No Has patient had a PCN reaction causing severe rash involving mucus membranes or skin necrosis: No Has patient had a PCN reaction that required hospitalization No Has patient had a PCN reaction occurring within the last 10 years: No If all of the above answers are "NO", then may proceed with Cephalosporin use.    Family History: Family History  Problem Relation Age of Onset   . Prostate cancer Father   . Kidney disease Mother   . Kidney cancer Neg Hx   . Bladder Cancer Neg Hx     Social History:  reports that he has never smoked. He has never used smokeless tobacco. He reports that he drinks alcohol. He reports that he does not use drugs.  ROS: UROLOGY Frequent Urination?: Yes Hard to postpone urination?: No Burning/pain with urination?: No Get up at night to urinate?: No Leakage of urine?: No Urine stream starts and stops?: No Trouble starting stream?: No Do you have to strain to urinate?: No Blood in urine?: No Urinary tract infection?: No Sexually transmitted disease?: No Injury to kidneys or bladder?: No Painful intercourse?: No Weak stream?: No Erection problems?: No Penile pain?: No  Gastrointestinal Nausea?: No Vomiting?: No Indigestion/heartburn?: No Diarrhea?: No Constipation?: No  Constitutional Fever: No Night sweats?: No Weight loss?: No Fatigue?: No  Skin Skin rash/lesions?: No Itching?: No  Eyes Blurred vision?: No Double vision?: No  Ears/Nose/Throat Sore throat?: No Sinus problems?: No  Hematologic/Lymphatic Swollen glands?: No Easy bruising?: No  Cardiovascular Leg swelling?: No Chest pain?: No  Respiratory Cough?: No Shortness of breath?: No  Endocrine Excessive thirst?: No  Musculoskeletal Back pain?: No Joint pain?: No  Neurological Headaches?: No Dizziness?: No  Psychologic Depression?: No Anxiety?: No  Physical Exam: BP 116/73   Pulse 72   Ht 5' 6" (1.676 m)   Wt 199 lb 6.4 oz (90.4 kg)   BMI 32.18 kg/m   Constitutional: Well nourished. Alert and oriented, No acute distress. HEENT: [place] AT, moist mucus membranes. Trachea midline, no masses. Cardiovascular: No clubbing, cyanosis, or edema. Respiratory: Normal respiratory effort, no increased work of breathing. Skin: No rashes, bruises or suspicious lesions. Lymph: No cervical or inguinal adenopathy. Neurologic: Grossly  intact, no focal deficits, moving all 4 extremities. Psychiatric: Normal mood and affect.  Laboratory Data: Lab Results  Component Value Date   WBC 7.9 03/18/2016   HGB 12.3 (L) 03/18/2016   HCT 36.9 (L) 03/18/2016   MCV 81.5 03/18/2016   PLT 291 03/18/2016    Lab Results  Component Value Date   CREATININE 0.80 03/16/2016     Lab Results  Component Value Date   HGBA1C 5.7 04/11/2013    Lab Results  Component Value Date   TSH 1.40 04/11/2013      Lab Results  Component Value Date   AST 12 (L) 03/16/2016   Lab Results  Component Value Date   ALT 11 (L) 03/16/2016   Urinalysis > 30 WBC's and 11-30 RBC's.  See EPIC.   Results for orders placed or performed in visit on 07/11/16  Microscopic Examination  Result Value Ref Range   WBC, UA >30 (A) 0 - 5 /hpf   RBC, UA 11-30 (A) 0 - 2 /hpf   Epithelial Cells (non renal) 0-10 0 - 10 /hpf   Mucus, UA Present (A) Not Estab.   Bacteria, UA Few None seen/Few  Urinalysis, Complete  Result Value Ref Range   Specific Gravity, UA 1.025 1.005 - 1.030   pH, UA 5.0 5.0 - 7.5   Color, UA Yellow Yellow   Appearance Ur Cloudy (A) Clear   Leukocytes, UA 1+ (A) Negative   Protein, UA Trace (A) Negative/Trace   Glucose, UA Negative Negative   Ketones, UA Negative Negative   RBC, UA 2+ (A) Negative   Bilirubin, UA Negative Negative   Urobilinogen, Ur 0.2 0.2 - 1.0 mg/dL   Nitrite, UA Negative Negative   Microscopic Examination See below:     Pertinent Imaging: CLINICAL DATA:  History of urinary tract stones. Status post lithotripsy 03/29/2016.  EXAM: ABDOMEN - 1 VIEW  COMPARISON:  CT abdomen and pelvis 06/27/2016.  KUB 07/06/2016.  FINDINGS: No urinary tract stones identified. Gas and stool could obscure small stones. The bowel gas pattern is nonobstructive. The patient is status post cholecystectomy. Mild degenerative change about the right hip is noted. Imaged bones are otherwise  unremarkable.  IMPRESSION: Negative for urinary tract stone.  No acute finding.   Electronically Signed   By: Thomas  Dalessio M.D.   On: 07/11/2016 15:37  Patient will undergo left ureteroscopy with laser lithotripsy with ureteral stent placement for definitve treatment of a left 3 mm UPJ stone.    Assessment & Plan:    1. Left ureteral stone  - patient has not passed any fragments  - KUB did not identify any stones  - UA is positive > 30 WBC's and 11- 30 RBC's  - urine culture positive for yeast- identification and susceptibility are pending  -   patient will have RUS, if hydronephrosis is still present, pursue left URS/LL/ureteral stent placement- reviewed the procedure, risks and what to expect with stents as he has had this procedure recently  2. Left hydronephrosis  - Obtain RUS one month after passing stone  3. History of bilateral nephrolithiasis  - Stone composition noted 30% calcium oxalate dihydrate, 60% calcium oxalate monohydrate and 10% calcium phosphate  - advised the patient to undergo a 24 Litholink once UA is clear   4. Microscopic hematuria  - still positive for > 11-30 RBC's  - if hematuria persists, we will pursue a hematuria workup with CT Urogram and cystoscopy if appropriate   Return for left URS/LL/ureteral stent placement.  These notes generated with voice recognition software. I apologize for typographical errors.  Jaiveer Panas, PA-C  [facility] [address], [address] [place], [place] [address] [phone]   

## 2016-07-12 ENCOUNTER — Other Ambulatory Visit: Payer: Self-pay | Admitting: Radiology

## 2016-07-12 ENCOUNTER — Telehealth: Payer: Self-pay | Admitting: Radiology

## 2016-07-12 DIAGNOSIS — N201 Calculus of ureter: Secondary | ICD-10-CM

## 2016-07-12 NOTE — Telephone Encounter (Signed)
Notified pt of surgery scheduled with Dr Apolinar JunesBrandon on 07/18/16, pre-admit testing phone interview on 07/15/16 between 9am-1pm & to call Friday prior to surgery for arrival time to SDS. Pt voices understanding.

## 2016-07-14 ENCOUNTER — Ambulatory Visit
Admission: RE | Admit: 2016-07-14 | Discharge: 2016-07-14 | Disposition: A | Discharge status: Home / Self Care | Payer: BLUE CROSS/BLUE SHIELD | Source: Ambulatory Visit | Attending: Urology | Admitting: Urology

## 2016-07-14 DIAGNOSIS — N1330 Unspecified hydronephrosis: Secondary | ICD-10-CM | POA: Diagnosis not present

## 2016-07-14 DIAGNOSIS — N132 Hydronephrosis with renal and ureteral calculous obstruction: Secondary | ICD-10-CM

## 2016-07-15 ENCOUNTER — Encounter
Admission: RE | Admit: 2016-07-15 | Discharge: 2016-07-15 | Disposition: A | Discharge status: Home / Self Care | Payer: BLUE CROSS/BLUE SHIELD | Source: Ambulatory Visit | Attending: Urology | Admitting: Urology

## 2016-07-15 ENCOUNTER — Telehealth: Payer: Self-pay

## 2016-07-15 LAB — ORGANISM IDENTIFICATION, YEAST

## 2016-07-15 LAB — SPECIMEN STATUS REPORT

## 2016-07-15 NOTE — Telephone Encounter (Signed)
This pt is scheduled for ureteroscopy on 07/18/16.

## 2016-07-15 NOTE — Patient Instructions (Signed)
  Your procedure is scheduled on: 07-18-16 Report to Same Day Surgery 2nd floor medical mall [facility](Medical Mall Entrance-take elevator on left to 2nd floor.  Check in with surgery information desk.) To find out your arrival time please call [phone] between 1PM - 3PM on 07-15-16  Remember: Instructions that are not followed completely may result in serious medical risk, up to and including death, or upon the discretion of your surgeon and anesthesiologist your surgery may need to be rescheduled.    _x___ 1. Do not eat food or drink liquids after midnight. No gum chewing or hard candies.     __x__ 2. No Alcohol for 24 hours before or after surgery.   __x__3. No Smoking for 24 prior to surgery.   ____  4. Bring all medications with you on the day of surgery if instructed.    __x__ 5. Notify your doctor if there is any change in your medical condition     (cold, fever, infections).     Do not wear jewelry, make-up, hairpins, clips or nail polish.  Do not wear lotions, powders, or perfumes. You may wear deodorant.  Do not shave 48 hours prior to surgery. Men may shave face and neck.  Do not bring valuables to the hospital.    [facility] any belongings or valuables.               Contacts, dentures or bridgework may not be worn into surgery.  Leave your suitcase in the car. After surgery it may be brought to your room.  For patients admitted to the hospital, discharge time is determined by your treatment team.   Patients discharged the day of surgery will not be allowed to drive home.  You will need someone to drive you home and stay with you the night of your procedure.    Please read over the following fact sheets that you were given:   [facility] Preparing for Surgery and or MRSA Information   _x___ Take these medicines the morning of surgery with A SIP OF WATER:    1. PRILOSEC  2.  3.  4.  5.  6.  ____Fleets enema or Magnesium Citrate as  directed.   ____ Use CHG Soap or sage wipes as directed on instruction sheet   ____ Use inhalers on the day of surgery and bring to hospital day of surgery  ____ Stop metformin 2 days prior to surgery    ____ Take 1/2 of usual insulin dose the night before surgery and none on the morning of  surgery.   ____ Stop Aspirin, Coumadin, Pllavix ,Eliquis, Effient, or Pradaxa  x__ Stop Anti-inflammatories such as Advil, Aleve, Ibuprofen, Motrin, Naproxen,          Naprosyn, Goodies powders or aspirin products NOW-Ok to take Tylenol.   ____ Stop supplements until after surgery.    ____ Bring C-Pap to the hospital.

## 2016-07-15 NOTE — Telephone Encounter (Signed)
Did we schedule this gentleman for ureteroscopy?

## 2016-07-18 ENCOUNTER — Encounter
Admission: RE | Disposition: A | Discharge status: Home / Self Care | Payer: Self-pay | Source: Ambulatory Visit | Attending: Urology

## 2016-07-18 ENCOUNTER — Ambulatory Visit
Admission: RE | Admit: 2016-07-18 | Discharge: 2016-07-18 | Disposition: A | Discharge status: Home / Self Care | Payer: BLUE CROSS/BLUE SHIELD | Source: Ambulatory Visit | Attending: Urology | Admitting: Urology

## 2016-07-18 ENCOUNTER — Ambulatory Visit: Payer: BLUE CROSS/BLUE SHIELD | Admitting: Certified Registered Nurse Anesthetist

## 2016-07-18 ENCOUNTER — Encounter: Payer: Self-pay | Admitting: *Deleted

## 2016-07-18 DIAGNOSIS — Z87442 Personal history of urinary calculi: Secondary | ICD-10-CM | POA: Insufficient documentation

## 2016-07-18 DIAGNOSIS — Z841 Family history of disorders of kidney and ureter: Secondary | ICD-10-CM | POA: Insufficient documentation

## 2016-07-18 DIAGNOSIS — R000 Tachycardia, unspecified: Secondary | ICD-10-CM | POA: Diagnosis not present

## 2016-07-18 DIAGNOSIS — N202 Calculus of kidney with calculus of ureter: Secondary | ICD-10-CM

## 2016-07-18 DIAGNOSIS — Z880 Allergy status to penicillin: Secondary | ICD-10-CM | POA: Diagnosis not present

## 2016-07-18 DIAGNOSIS — Z8042 Family history of malignant neoplasm of prostate: Secondary | ICD-10-CM | POA: Insufficient documentation

## 2016-07-18 DIAGNOSIS — N132 Hydronephrosis with renal and ureteral calculous obstruction: Secondary | ICD-10-CM | POA: Diagnosis not present

## 2016-07-18 DIAGNOSIS — K219 Gastro-esophageal reflux disease without esophagitis: Secondary | ICD-10-CM | POA: Insufficient documentation

## 2016-07-18 DIAGNOSIS — G4730 Sleep apnea, unspecified: Secondary | ICD-10-CM | POA: Diagnosis not present

## 2016-07-18 DIAGNOSIS — N201 Calculus of ureter: Secondary | ICD-10-CM

## 2016-07-18 DIAGNOSIS — Z9884 Bariatric surgery status: Secondary | ICD-10-CM | POA: Diagnosis not present

## 2016-07-18 DIAGNOSIS — N281 Cyst of kidney, acquired: Secondary | ICD-10-CM | POA: Insufficient documentation

## 2016-07-18 DIAGNOSIS — N200 Calculus of kidney: Secondary | ICD-10-CM

## 2016-07-18 HISTORY — PX: CYSTOSCOPY WITH STENT PLACEMENT: SHX5790

## 2016-07-18 HISTORY — PX: CYSTOSCOPY/RETROGRADE/URETEROSCOPY: SHX5316

## 2016-07-18 HISTORY — PX: STONE EXTRACTION WITH BASKET: SHX5318

## 2016-07-18 SURGERY — CYSTOSCOPY, WITH STENT INSERTION
Anesthesia: General | Laterality: Left | Wound class: Clean Contaminated

## 2016-07-18 MED ORDER — FLUCONAZOLE 100 MG PO TABS: 100.0000 mg | ORAL_TABLET | Freq: Every day | ORAL | 0 refills | Status: DC

## 2016-07-18 MED ORDER — FLUCONAZOLE IN SODIUM CHLORIDE 100-0.9 MG/50ML-% IV SOLN
100.0000 mg | Freq: Once | INTRAVENOUS | Status: AC
  Administered 2016-07-18: 100 mg via INTRAVENOUS
  Filled 2016-07-18 (×2): qty 50

## 2016-07-18 MED ORDER — ONDANSETRON HCL 4 MG/2ML IJ SOLN: 4.0000 mg | Freq: Once | INTRAMUSCULAR | Status: DC | PRN

## 2016-07-18 MED ORDER — FENTANYL CITRATE (PF) 100 MCG/2ML IJ SOLN
INTRAMUSCULAR | Status: AC
  Administered 2016-07-18: 25 ug via INTRAVENOUS
  Filled 2016-07-18: qty 2

## 2016-07-18 MED ORDER — DEXAMETHASONE SODIUM PHOSPHATE 10 MG/ML IJ SOLN
INTRAMUSCULAR | Status: DC | PRN
  Administered 2016-07-18: 10 mg via INTRAVENOUS

## 2016-07-18 MED ORDER — LACTATED RINGERS IV SOLN
INTRAVENOUS | Status: DC
  Administered 2016-07-18: 14:00:00 via INTRAVENOUS

## 2016-07-18 MED ORDER — ONDANSETRON HCL 4 MG/2ML IJ SOLN
INTRAMUSCULAR | Status: DC | PRN
  Administered 2016-07-18: 4 mg via INTRAVENOUS

## 2016-07-18 MED ORDER — CIPROFLOXACIN IN D5W 400 MG/200ML IV SOLN
400.0000 mg | INTRAVENOUS | Status: AC
  Administered 2016-07-18: 400 mg via INTRAVENOUS

## 2016-07-18 MED ORDER — FENTANYL CITRATE (PF) 100 MCG/2ML IJ SOLN
25.0000 ug | INTRAMUSCULAR | Status: DC | PRN
  Administered 2016-07-18 (×2): 25 ug via INTRAVENOUS

## 2016-07-18 MED ORDER — SUGAMMADEX SODIUM 500 MG/5ML IV SOLN
INTRAVENOUS | Status: DC | PRN
Start: 2016-07-18 — End: 2016-07-18
  Administered 2016-07-18: 400 mg via INTRAVENOUS

## 2016-07-18 MED ORDER — ROCURONIUM BROMIDE 100 MG/10ML IV SOLN
INTRAVENOUS | Status: DC | PRN
  Administered 2016-07-18: 20 mg via INTRAVENOUS
  Administered 2016-07-18: 30 mg via INTRAVENOUS

## 2016-07-18 MED ORDER — HYDROCODONE-ACETAMINOPHEN 5-325 MG PO TABS: 1.0000 | ORAL_TABLET | Freq: Four times a day (QID) | ORAL | 0 refills | Status: DC | PRN

## 2016-07-18 MED ORDER — LIDOCAINE HCL (CARDIAC) 20 MG/ML IV SOLN
INTRAVENOUS | Status: DC | PRN
  Administered 2016-07-18: 100 mg via INTRAVENOUS

## 2016-07-18 MED ORDER — PROPOFOL 10 MG/ML IV BOLUS
INTRAVENOUS | Status: DC | PRN
  Administered 2016-07-18: 180 mg via INTRAVENOUS

## 2016-07-18 MED ORDER — OXYBUTYNIN CHLORIDE 5 MG PO TABS: 5.0000 mg | ORAL_TABLET | Freq: Three times a day (TID) | ORAL | 0 refills | Status: DC | PRN

## 2016-07-18 MED ORDER — CIPROFLOXACIN IN D5W 400 MG/200ML IV SOLN
INTRAVENOUS | Status: AC
  Filled 2016-07-18: qty 200

## 2016-07-18 MED ORDER — FENTANYL CITRATE (PF) 100 MCG/2ML IJ SOLN
INTRAMUSCULAR | Status: DC | PRN
  Administered 2016-07-18: 100 ug via INTRAVENOUS

## 2016-07-18 MED ORDER — DOCUSATE SODIUM 100 MG PO CAPS: 100.0000 mg | ORAL_CAPSULE | Freq: Two times a day (BID) | ORAL | 0 refills | Status: DC

## 2016-07-18 MED ORDER — MIDAZOLAM HCL 2 MG/2ML IJ SOLN
INTRAMUSCULAR | Status: DC | PRN
  Administered 2016-07-18: 2 mg via INTRAVENOUS

## 2016-07-18 MED ORDER — IOTHALAMATE MEGLUMINE 43 % IV SOLN
INTRAVENOUS | Status: DC | PRN
  Administered 2016-07-18: 15 mL

## 2016-07-18 SURGICAL SUPPLY — 30 items
BAG DRAIN CYSTO-URO LG1000N (MISCELLANEOUS) ×2 IMPLANT
BASKET ZERO TIP 1.9FR (BASKET) IMPLANT
CATH URETL 5X70 OPEN END (CATHETERS) ×2 IMPLANT
CNTNR SPEC 2.5X3XGRAD LEK (MISCELLANEOUS) ×1
CONRAY 43 FOR UROLOGY 50M (MISCELLANEOUS) ×2 IMPLANT
CONT SPEC 4OZ STER OR WHT (MISCELLANEOUS) ×1
CONTAINER SPEC 2.5X3XGRAD LEK (MISCELLANEOUS) ×1 IMPLANT
DRAPE UTILITY 15X26 TOWEL STRL (DRAPES) ×2 IMPLANT
GLOVE BIO SURGEON STRL SZ 6.5 (GLOVE) ×2 IMPLANT
GOWN STRL REUS W/ TWL LRG LVL3 (GOWN DISPOSABLE) ×2 IMPLANT
GOWN STRL REUS W/ TWL LRG LVL4 (GOWN DISPOSABLE) ×2 IMPLANT
GOWN STRL REUS W/TWL LRG LVL3 (GOWN DISPOSABLE) ×2
GOWN STRL REUS W/TWL LRG LVL4 (GOWN DISPOSABLE) ×2
GUIDEWIRE SUPER STIFF (WIRE) IMPLANT
INFUSOR MANOMETER BAG 3000ML (MISCELLANEOUS) ×2 IMPLANT
INTRODUCER DILATOR DOUBLE (INTRODUCER) ×2 IMPLANT
KIT RM TURNOVER CYSTO AR (KITS) ×2 IMPLANT
PACK CYSTO AR (MISCELLANEOUS) ×2 IMPLANT
SCRUB POVIDONE IODINE 4 OZ (MISCELLANEOUS) ×2 IMPLANT
SENSORWIRE 0.038 NOT ANGLED (WIRE) ×2
SET CYSTO W/LG BORE CLAMP LF (SET/KITS/TRAYS/PACK) ×2 IMPLANT
SHEATH URETERAL 12FRX35CM (MISCELLANEOUS) IMPLANT
SOL .9 NS 3000ML IRR  AL (IV SOLUTION) ×1
SOL .9 NS 3000ML IRR UROMATIC (IV SOLUTION) ×1 IMPLANT
STENT URET 6FRX24 CONTOUR (STENTS) IMPLANT
STENT URET 6FRX26 CONTOUR (STENTS) ×2 IMPLANT
SURGILUBE 2OZ TUBE FLIPTOP (MISCELLANEOUS) ×2 IMPLANT
SYRINGE IRR TOOMEY STRL 70CC (SYRINGE) ×2 IMPLANT
WATER STERILE IRR 1000ML POUR (IV SOLUTION) ×2 IMPLANT
WIRE SENSOR 0.038 NOT ANGLED (WIRE) ×1 IMPLANT

## 2016-07-18 NOTE — H&P (View-Only) (Signed)
07/11/2016 4:47 PM   [name] Aug 21, 1970 [id number]  Referring provider: Valera Castle, MD [address] [place], [place] [address]  Chief Complaint  Patient presents with  . Follow-up    1 week ureteral stone L hydronephrosis    HPI: Patient is a 45 year old Caucasian male who presents today for one week recheck with KUB, UA and symptom recheck for a 3 mm left UVJ stone.    Background history CT Urogram noted a 9 mm calculus in the right renal collecting system most likely responsible for the patient's intermittent hematuria.  Multiple bilateral renal cysts. No worrisome renal lesions. No ureteral or bladder lesions.  Stable hyperdense/hemorrhagic cyst associate with the lower pole region of left kidney.   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 No complicating features.  Status post cholecystectomy.  No biliary dilatation.  A CT renal stone study performed two weeks later noted a 3 mm stone in the left ureter with left-sided hydronephrosis. In retrospect, the stone was present on the February 03, 2016 study. This stone has only migrated distally 1 or 2 cm in the interval.  Stones in of the right renal pelvis without active obstruction.  The stones could certainly cause intermittent obstruction given  their location.  Mild atherosclerotic change in the abdominal aorta.  Patient attempted MET for the left UVJ stone.  It was felt that it passed and he underwent right ESWL for the right UPJ stone.  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   He then developed pyelonephritis and was admitted for IV antibiotics.  KUB today notes bilateral ureteral stents in place and possible bilateral ureteral stones.  He is currently scheduled for cystoscopy with bilateral URS/LL/stent exchange.  He also underwent bilateral stent removal.  His renal ultrasound completed on 05/05/2016 noted 1 cm simple  cyst seen in upper pole of right kidney. 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 No other significant abnormality seen in the kidneys.  I have independently reviewed the films.  Stone composition noted 30% calcium oxalate dihydrate, 60% calcium oxalate monohydrate and 10% calcium phosphate.  At his last visit, he stated for the last 2 weeks he's been experiencing urgency, mild urge incontinence and nocturia.  He is denied gross hematuria, suprapubic pain or dysuria.  He has not had fevers, chills, nausea or vomiting.  His PVR was 0 mL. His UA was positive for 11-30 WBCs and greater than 30 RBCs and calcium oxalate crystals are present.  Urine culture was negative. The patient then underwent a CT renal stone study that was performed on 06/27/2016 noted a moderate left hydronephrosis due to 3 mm calculus at the left ureteropelvic junction. 3 mm nonobstructive calculus also seen in dependent portion of left renal pelvis.  I have independently reviewed the films.  KUB taken on 07/06/2016 noted no ureteral stone.    KUB taken today did not note any ureteral stones.  I have independently reviewed the films.  He has not passed any fragments.  He is still having urinary frequency and he is having left flank pain.  He has not had any gross hematuria.  He denies any fevers, chills, nausea and vomiting.  UA today demonstrates > 30 WBC's and 11-30 RBC's.  His urine culture was positive for yeast.      PMH: Past Medical History:  Diagnosis Date  . Acid reflux   . Hematuria   . Kidney stones   . Sleep apnea   . Urolithiasis     Surgical History: Past Surgical History:  Procedure Laterality Date  . CYSTOSCOPY W/ RETROGRADES Bilateral 03/05/2016   Procedure: CYSTOSCOPY WITH RETROGRADE PYELOGRAM;  Surgeon: Nickie Retort, MD;  Location: [facility];  Service: Urology;  Laterality: Bilateral;  . CYSTOSCOPY W/ URETERAL STENT  PLACEMENT Bilateral 03/29/2016   Procedure: CYSTOSCOPY WITH STENT REPLACEMENT;  Surgeon: Hollice Espy, MD;  Location: [facility];  Service: Urology;  Laterality: Bilateral;  . CYSTOSCOPY WITH STENT PLACEMENT Bilateral 03/05/2016   Procedure: CYSTOSCOPY WITH STENT PLACEMENT;  Surgeon: Nickie Retort, MD;  Location: [facility];  Service: Urology;  Laterality: Bilateral;  . EXTRACORPOREAL SHOCK WAVE LITHOTRIPSY Right 03/03/2016   Procedure: EXTRACORPOREAL SHOCK WAVE LITHOTRIPSY (ESWL);  Surgeon: Hollice Espy, MD;  Location: [facility];  Service: Urology;  Laterality: Right;  . KNEE SURGERY    . LAPAROSCOPIC GASTRIC SLEEVE RESECTION    . URETEROSCOPY Left 03/29/2016   Procedure: URETEROSCOPY;  Surgeon: Hollice Espy, MD;  Location: [facility];  Service: Urology;  Laterality: Left;  . URETEROSCOPY WITH HOLMIUM LASER LITHOTRIPSY Right 03/29/2016   Procedure: URETEROSCOPY WITH HOLMIUM LASER LITHOTRIPSY;  Surgeon: Hollice Espy, MD;  Location: [facility];  Service: Urology;  Laterality: Right;    Home Medications:    Medication List       Accurate as of 07/11/16  4:47 PM. Always use your most recent med list.          omeprazole 20 MG capsule Commonly known as:  PRILOSEC Take 20 mg by mouth at bedtime.   tamsulosin 0.4 MG Caps capsule Commonly known as:  FLOMAX Take 1 capsule (0.4 mg total) by mouth daily.       Allergies:  Allergies  Allergen Reactions  . Penicillins Itching and Rash    Has patient had a PCN reaction causing immediate rash, facial/tongue/throat swelling, SOB or lightheadedness with hypotension: No Has patient had a PCN reaction causing severe rash involving mucus membranes or skin necrosis: No Has patient had a PCN reaction that required hospitalization No Has patient had a PCN reaction occurring within the last 10 years: No If all of the above answers are "NO", then may proceed with Cephalosporin use.    Family History: Family History  Problem Relation Age of Onset   . Prostate cancer Father   . Kidney disease Mother   . Kidney cancer Neg Hx   . Bladder Cancer Neg Hx     Social History:  reports that he has never smoked. He has never used smokeless tobacco. He reports that he drinks alcohol. He reports that he does not use drugs.  ROS: UROLOGY Frequent Urination?: Yes Hard to postpone urination?: No Burning/pain with urination?: No Get up at night to urinate?: No Leakage of urine?: No Urine stream starts and stops?: No Trouble starting stream?: No Do you have to strain to urinate?: No Blood in urine?: No Urinary tract infection?: No Sexually transmitted disease?: No Injury to kidneys or bladder?: No Painful intercourse?: No Weak stream?: No Erection problems?: No Penile pain?: No  Gastrointestinal Nausea?: No Vomiting?: No Indigestion/heartburn?: No Diarrhea?: No Constipation?: No  Constitutional Fever: No Night sweats?: No Weight loss?: No Fatigue?: No  Skin Skin rash/lesions?: No Itching?: No  Eyes Blurred vision?: No Double vision?: No  Ears/Nose/Throat Sore throat?: No Sinus problems?: No  Hematologic/Lymphatic Swollen glands?: No Easy bruising?: No  Cardiovascular Leg swelling?: No Chest pain?: No  Respiratory Cough?: No Shortness of breath?: No  Endocrine Excessive thirst?: No  Musculoskeletal Back pain?: No Joint pain?: No  Neurological Headaches?: No Dizziness?: No  Psychologic Depression?: No Anxiety?: No  Physical Exam: BP 116/73   Pulse 72   Ht 5' 6" (1.676 m)   Wt 199 lb 6.4 oz (90.4 kg)   BMI 32.18 kg/m   Constitutional: Well nourished. Alert and oriented, No acute distress. HEENT: [place] AT, moist mucus membranes. Trachea midline, no masses. Cardiovascular: No clubbing, cyanosis, or edema. Respiratory: Normal respiratory effort, no increased work of breathing. Skin: No rashes, bruises or suspicious lesions. Lymph: No cervical or inguinal adenopathy. Neurologic: Grossly  intact, no focal deficits, moving all 4 extremities. Psychiatric: Normal mood and affect.  Laboratory Data: Lab Results  Component Value Date   WBC 7.9 03/18/2016   HGB 12.3 (L) 03/18/2016   HCT 36.9 (L) 03/18/2016   MCV 81.5 03/18/2016   PLT 291 03/18/2016    Lab Results  Component Value Date   CREATININE 0.80 03/16/2016     Lab Results  Component Value Date   HGBA1C 5.7 04/11/2013    Lab Results  Component Value Date   TSH 1.40 04/11/2013      Lab Results  Component Value Date   AST 12 (L) 03/16/2016   Lab Results  Component Value Date   ALT 11 (L) 03/16/2016   Urinalysis > 30 WBC's and 11-30 RBC's.  See EPIC.   Results for orders placed or performed in visit on 07/11/16  Microscopic Examination  Result Value Ref Range   WBC, UA >30 (A) 0 - 5 /hpf   RBC, UA 11-30 (A) 0 - 2 /hpf   Epithelial Cells (non renal) 0-10 0 - 10 /hpf   Mucus, UA Present (A) Not Estab.   Bacteria, UA Few None seen/Few  Urinalysis, Complete  Result Value Ref Range   Specific Gravity, UA 1.025 1.005 - 1.030   pH, UA 5.0 5.0 - 7.5   Color, UA Yellow Yellow   Appearance Ur Cloudy (A) Clear   Leukocytes, UA 1+ (A) Negative   Protein, UA Trace (A) Negative/Trace   Glucose, UA Negative Negative   Ketones, UA Negative Negative   RBC, UA 2+ (A) Negative   Bilirubin, UA Negative Negative   Urobilinogen, Ur 0.2 0.2 - 1.0 mg/dL   Nitrite, UA Negative Negative   Microscopic Examination See below:     Pertinent Imaging: CLINICAL DATA:  History of urinary tract stones. Status post lithotripsy 03/29/2016.  EXAM: ABDOMEN - 1 VIEW  COMPARISON:  CT abdomen and pelvis 06/27/2016.  KUB 07/06/2016.  FINDINGS: No urinary tract stones identified. Gas and stool could obscure small stones. The bowel gas pattern is nonobstructive. The patient is status post cholecystectomy. Mild degenerative change about the right hip is noted. Imaged bones are otherwise  unremarkable.  IMPRESSION: Negative for urinary tract stone.  No acute finding.   Electronically Signed   By: Inge Rise M.D.   On: 07/11/2016 15:37  Patient will undergo left ureteroscopy with laser lithotripsy with ureteral stent placement for definitve treatment of a left 3 mm UPJ stone.    Assessment & Plan:    1. Left ureteral stone  - patient has not passed any fragments  - KUB did not identify any stones  - UA is positive > 30 WBC's and 11- 30 RBC's  - urine culture positive for yeast- identification and susceptibility are pending  -  patient will have RUS, if hydronephrosis is still present, pursue left URS/LL/ureteral stent placement- reviewed the procedure, risks and what to expect with stents as he has had this procedure recently  2. Left hydronephrosis  - Obtain RUS one month after passing stone  3. History of bilateral nephrolithiasis  - Stone composition noted 30% calcium oxalate dihydrate, 60% calcium oxalate monohydrate and 10% calcium phosphate  - advised the patient to undergo a 24 Litholink once UA is clear   4. Microscopic hematuria  - still positive for > 11-30 RBC's  - if hematuria persists, we will pursue a hematuria workup with CT Urogram and cystoscopy if appropriate   Return for left URS/LL/ureteral stent placement.  These notes generated with voice recognition software. I apologize for typographical errors.  Zara Council, [facility] [address] [place], [place] [address] [phone]

## 2016-07-18 NOTE — Op Note (Signed)
Date of procedure: 07/18/16  Preoperative diagnosis:  1. Left hydronephrosis 2. Left obstructing ureteral stone 3. Left nonobstructing kidney stone   Postoperative diagnosis:  Same as above  Procedure: 1.  Left retrograde pyleogram 1. Left ureteroscopy 2. basket extraction of Stone fragment 3. Left ureteral stent placement  Surgeon: Alex ScotlandAshley Menachem Urbanek, MD  Anesthesia: General  Complications: None  Intraoperative findings:  3 mm left proximal ureteral stone mildly embedded into ureteral mucosa was significant edema. Nonobstructing 2 mm stone.  EBL:  Minimal  Specimens:  Stone fragment  Drains: 6 x 26 Fr JJ ureteral stent on left  Indication: Alex Maineeter W Macaulay is a 45 y.o. patient with obstructing left proximal ureteral stone.  After reviewing the management options for treatment, he elected to proceed with the above surgical procedure(s). We have discussed the potential benefits and risks of the procedure, side effects of the proposed treatment, the likelihood of the patient achieving the goals of the procedure, and any potential problems that might occur during the procedure or recuperation. Informed consent has been obtained.  Description of procedure:  The patient was taken to the operating room and general anesthesia was induced.  The patient was placed in the dorsal lithotomy position, prepped and draped in the usual sterile fashion, and preoperative antibiotics were administered. A preoperative time-out was performed.   A 21 French cystoscope was advanced per urethra into the bladder. Attention was turned to the left ureteral orifice which was cannulated using a 5 [place] open-ended ureteral catheter. A gentle retrograde pyelogram was then performed which revealed a decompressed ureter up to level of the proximal ureter where the stone was previously seen on the CT scan. At this point, there was a bottleneck narrowing without obvious calcification and proximal hydroureteronephrosis,  mild. A sensor wire was advanced up to the level of the kidney without difficulty. There was snapped in place as a safety wire. A 4.5 French semirigid ureteroscope was then advanced all the way up to level of the defect where a stone could be seen embedded partially within the mucosa of the ureter with significant bullous edema surrounding the stone. A 1.9 French nitinol basket was then used to dislodge the stone and grasped the entire stone. This was then able to be brought out en bloc without fragmentation easily. This was sent off for stone analysis. The scope was then able to be advanced beyond this level and no additional stones or stone fragments were identified. There is no sterilization of contrast on repeat retrograde pyelogram. The pelvic gram to create a roadmap of the upper tract. A dual lumen was then used to introduce a Super Stiff wire up to level of the kidney. A flexible 7 [place] single channel ureteroscope was then advanced up to level of the kidney under fluoroscopic guidance. Each and every calyx was then directly visualized. A small approximate 2 mm stone was encountered within the renal pelvis and basketed out of the collecting system. The scope was backed down the length of the ureter inspecting along the way. No additional stones or stone fragments were identified. The safety wire was then backloaded over a rigid cystoscope. A 6 x 26 French double-J ureteral stent was advanced over the wire up to level of the kidney. The wire was partially drawn until full coil was noted within the upper pole calyx. The wire was then fully withdrawn and a full course of the bladder. The bladder was drained. The patient was then cleaned and dried, repositioned the supine position,  reversed from anesthesia, taken the PACU in stable condition.  Plan: Patient will return in 2 weeks for cystoscopy stent removal. We'll treat him postoperatively for possible candida and his yeast based on his preoperative  urinalysis. We will leave the stent for slightly longer given the amount of bullous edema in the proximal ureter.  Alex ScotlandAshley Aayat Garcia, M.D.

## 2016-07-18 NOTE — Discharge Instructions (Signed)
AMBULATORY SURGERY  °DISCHARGE INSTRUCTIONS ° ° °1) The drugs that you were given will stay in your system until tomorrow so for the next 24 hours you should not: ° °A) Drive an automobile °B) Make any legal decisions °C) Drink any alcoholic beverage ° ° °2) You may resume regular meals tomorrow.  Today it is better to start with liquids and gradually work up to solid foods. ° °You may eat anything you prefer, but it is better to start with liquids, then soup and crackers, and gradually work up to solid foods. ° ° °3) Please notify your doctor immediately if you have any unusual bleeding, trouble breathing, redness and pain at the surgery site, drainage, fever, or pain not relieved by medication. ° ° ° °4) Additional Instructions: ° ° ° ° ° ° ° °Please contact your physician with any problems or Same Day Surgery at [phone], Monday through Friday 6 am to 4 pm, or [place] [facility] number at [phone].You have a ureteral stent in place.  This is a tube that extends from your kidney to your bladder.  This may cause urinary bleeding, burning with urination, and urinary frequency.  Please call our office or present to the ED if you develop fevers >101 or pain which is not able to be controlled with oral pain medications.  You may be given either Flomax and/ or ditropan to help with bladder spasms and stent pain in addition to pain medications.   ° °[facility] °[address], [address] °[place], [place] [address] °[phone] ° °

## 2016-07-18 NOTE — Transfer of Care (Signed)
Immediate Anesthesia Transfer of Care Note  Patient: [name]  Procedure(s) Performed: Procedure(s): CYSTOSCOPY WITH STENT PLACEMENT (Left) CYSTOSCOPY/RETROGRADE/URETEROSCOPY (Left) STONE EXTRACTION WITH BASKET (Left)  Patient Location: PACU  Anesthesia Type:General  Level of Consciousness: sedated  Airway & Oxygen Therapy: Patient Spontanous Breathing and Patient connected to face mask oxygen  Post-op Assessment: Report given to RN and Post -op Vital signs reviewed and stable  Post vital signs: Reviewed and stable  Last Vitals:  Vitals:   07/18/16 1311 07/18/16 1556  BP: 132/90 (!) 135/93  Pulse: (!) 110 86  Resp: 18 16  Temp: 36.7 C 36.2 C    Complications: No apparent anesthesia complications

## 2016-07-18 NOTE — Anesthesia Preprocedure Evaluation (Addendum)
Anesthesia Evaluation  Patient identified by MRN, date of birth, ID band Patient awake    Reviewed: Allergy & Precautions, NPO status , Patient's Chart, lab work & pertinent test results  History of Anesthesia Complications Negative for: history of anesthetic complications  Airway Mallampati: II  TM Distance: >3 FB     Dental  (+) Chipped   Pulmonary sleep apnea ,    Pulmonary exam normal        Cardiovascular negative cardio ROS   Rate:Tachycardia     Neuro/Psych negative neurological ROS  negative psych ROS   GI/Hepatic negative GI ROS, Neg liver ROS, GERD  Medicated and Controlled,  Endo/Other  negative endocrine ROS  Renal/GU Renal disease (stones)negative Renal ROS  negative genitourinary   Musculoskeletal   Abdominal (+) + obese,   Peds negative pediatric ROS (+)  Hematology negative hematology ROS (+)   Anesthesia Other Findings Past Medical History: No date: Acid reflux No date: Hematuria No date: Kidney stones No date: Sleep apnea     Comment: HAD GASTRIC SLEEVE AND HAS COME OFF OF CPAP No date: Urolithiasis  Reproductive/Obstetrics                            Anesthesia Physical  Anesthesia Plan  ASA: II  Anesthesia Plan: General   Post-op Pain Management:    Induction: Intravenous and Rapid sequence  Airway Management Planned: Oral ETT  Additional Equipment:   Intra-op Plan:   Post-operative Plan: Extubation in OR  Informed Consent: I have reviewed the patients History and Physical, chart, labs and discussed the procedure including the risks, benefits and alternatives for the proposed anesthesia with the patient or authorized representative who has indicated his/her understanding and acceptance.   Dental advisory given  Plan Discussed with: CRNA and Surgeon  Anesthesia Plan Comments:         Anesthesia Quick Evaluation

## 2016-07-18 NOTE — Anesthesia Postprocedure Evaluation (Signed)
Anesthesia Post Note  Patient: [name]  Procedure(s) Performed: Procedure(s) (LRB): CYSTOSCOPY WITH STENT PLACEMENT (Left) CYSTOSCOPY/RETROGRADE/URETEROSCOPY (Left) STONE EXTRACTION WITH BASKET (Left)  Patient location during evaluation: PACU Anesthesia Type: General Level of consciousness: awake and alert Pain management: pain level controlled Vital Signs Assessment: post-procedure vital signs reviewed and stable Respiratory status: spontaneous breathing and respiratory function stable Cardiovascular status: stable Anesthetic complications: no     Last Vitals:  Vitals:   07/18/16 1311 07/18/16 1556  BP: 132/90 (!) 135/93  Pulse: (!) 110 86  Resp: 18 16  Temp: 36.7 C 36.2 C    Last Pain:  Vitals:   07/18/16 1556  TempSrc: Temporal  PainSc:                  Abdul Beirne K

## 2016-07-18 NOTE — Anesthesia Procedure Notes (Signed)
Procedure Name: Intubation Date/Time: 07/18/2016 3:07 PM Performed by: Stormy FabianURTIS, Jessly Lebeck Pre-anesthesia Checklist: Patient identified, Patient being monitored, Timeout performed, Emergency Drugs available and Suction available Patient Re-evaluated:Patient Re-evaluated prior to inductionOxygen Delivery Method: Circle system utilized Preoxygenation: Pre-oxygenation with 100% oxygen Intubation Type: IV induction Ventilation: Mask ventilation without difficulty Laryngoscope Size: Mac and 3 Grade View: Grade I Tube type: Oral Tube size: 7.5 mm Number of attempts: 1 Airway Equipment and Method: Stylet Placement Confirmation: ETT inserted through vocal cords under direct vision,  positive ETCO2 and breath sounds checked- equal and bilateral Secured at: 21 cm Tube secured with: Tape Dental Injury: Teeth and Oropharynx as per pre-operative assessment  Comments: Anterior larnyx

## 2016-07-18 NOTE — Interval H&P Note (Signed)
History and Physical Interval Note:  07/18/2016 1:42 PM  [name]  has presented today for surgery, with the diagnosis of left ureteral stone   The various methods of treatment have been discussed with the patient and family. After consideration of risks, benefits and other options for treatment, the patient has consented to  Procedure(s): URETEROSCOPY WITH HOLMIUM LASER LITHOTRIPSY (Left) CYSTOSCOPY WITH STENT PLACEMENT (Left) as a surgical intervention .  The patient's history has been reviewed, patient examined, no change in status, stable for surgery.  I have reviewed the patient's chart and labs.  Questions were answered to the patient's satisfaction.    RRR CTAB  +preop for candida, will cover for this Vanna ScotlandAshley Mariano Doshi

## 2016-07-19 ENCOUNTER — Encounter: Payer: Self-pay | Admitting: Urology

## 2016-07-19 ENCOUNTER — Other Ambulatory Visit: Payer: Self-pay

## 2016-07-19 DIAGNOSIS — R5082 Postprocedural fever: Secondary | ICD-10-CM

## 2016-07-19 DIAGNOSIS — N200 Calculus of kidney: Secondary | ICD-10-CM

## 2016-07-19 MED ORDER — SULFAMETHOXAZOLE-TRIMETHOPRIM 800-160 MG PO TABS: 1.0000 | ORAL_TABLET | Freq: Two times a day (BID) | ORAL | 0 refills | Status: DC

## 2016-07-19 NOTE — Progress Notes (Signed)
Patient called stating that he took his temp and he has a low grade fever of 100.7 while on pain medication post URS on Monday with Dr. Apolinar JunesBrandon.  Per Dr. Apolinar JunesBrandon patient was told if fever increases to 101 or greater to go to the ER for treatment, a script for Bactrim would be sent to his pharm for him to start today.

## 2016-07-22 ENCOUNTER — Ambulatory Visit: Payer: BLUE CROSS/BLUE SHIELD | Admitting: Urology

## 2016-07-27 ENCOUNTER — Ambulatory Visit
Admission: EM | Admit: 2016-07-27 | Discharge: 2016-07-27 | Disposition: A | Discharge status: Home / Self Care | Payer: BLUE CROSS/BLUE SHIELD | Attending: Internal Medicine | Admitting: Internal Medicine

## 2016-07-27 DIAGNOSIS — B379 Candidiasis, unspecified: Secondary | ICD-10-CM | POA: Diagnosis not present

## 2016-07-27 LAB — STONE ANALYSIS
CA OXALATE, DIHYDRATE: 75 %
Ca Oxalate,Monohydr.: 10 %
Ca phos cry stone ql IR: 15 %
STONE WEIGHT KSTONE: 14 mg

## 2016-07-27 LAB — URINALYSIS, COMPLETE (UACMP) WITH MICROSCOPIC
Glucose, UA: 100 mg/dL — AB
Nitrite: NEGATIVE
Protein, ur: >300 mg/dL — AB
Specific Gravity, Urine: >1.03 — ABNORMAL HIGH (ref 1.005–1.030)
pH: 5.5 (ref 5.0–8.0)

## 2016-07-27 NOTE — ED Triage Notes (Signed)
Pt c/o fever on and off for 3 days, temp today 98.2, headache, chills, and no energy. Appetite has diminished.  Body aches in his shoulders.

## 2016-07-27 NOTE — ED Provider Notes (Signed)
[facility]    CSN: [id number] Arrival date & time: 07/27/16  1143     History   Chief Complaint Chief Complaint  Patient presents with  . Chills    HPI [name] is a 45 y.o. male.   Patient with past medical history significant for multiple urology procedures most recently a week ago presents to clinic complaining of subjective fever and chills. He was prescribed Bactrim 7 days ago following his most recent lithotripsy and stent placement. He complains of urinary frequency as well as blood in his urine. He denies nausea and vomiting but admits to feeling pressure in his head and sometimes congestion in the chest.      Past Medical History:  Diagnosis Date  . Acid reflux   . Hematuria   . Kidney stones   . Sleep apnea    HAD GASTRIC SLEEVE AND HAS COME OFF OF CPAP  . Urolithiasis     Patient Active Problem List   Diagnosis Date Noted  . Sepsis secondary to UTI (HCC) 03/15/2016  . Nephrolithiasis 03/04/2016    Past Surgical History:  Procedure Laterality Date  . CYSTOSCOPY W/ RETROGRADES Bilateral 03/05/2016   Procedure: CYSTOSCOPY WITH RETROGRADE PYELOGRAM;  Surgeon: Hildred LaserBrian James Budzyn, MD;  Location: [facility];  Service: Urology;  Laterality: Bilateral;  . CYSTOSCOPY W/ URETERAL STENT PLACEMENT Bilateral 03/29/2016   Procedure: CYSTOSCOPY WITH STENT REPLACEMENT;  Surgeon: Vanna ScotlandAshley Brandon, MD;  Location: [facility];  Service: Urology;  Laterality: Bilateral;  . CYSTOSCOPY WITH STENT PLACEMENT Bilateral 03/05/2016   Procedure: CYSTOSCOPY WITH STENT PLACEMENT;  Surgeon: Hildred LaserBrian James Budzyn, MD;  Location: [facility];  Service: Urology;  Laterality: Bilateral;  . CYSTOSCOPY WITH STENT PLACEMENT Left 07/18/2016   Procedure: CYSTOSCOPY WITH STENT PLACEMENT;  Surgeon: Vanna ScotlandAshley Brandon, MD;  Location: [facility];  Service: Urology;  Laterality: Left;  . CYSTOSCOPY/RETROGRADE/URETEROSCOPY Left 07/18/2016   Procedure: CYSTOSCOPY/RETROGRADE/URETEROSCOPY;  Surgeon: Vanna ScotlandAshley  Brandon, MD;  Location: [facility];  Service: Urology;  Laterality: Left;  . EXTRACORPOREAL SHOCK WAVE LITHOTRIPSY Right 03/03/2016   Procedure: EXTRACORPOREAL SHOCK WAVE LITHOTRIPSY (ESWL);  Surgeon: Vanna ScotlandAshley Brandon, MD;  Location: [facility];  Service: Urology;  Laterality: Right;  . KNEE SURGERY    . LAPAROSCOPIC GASTRIC SLEEVE RESECTION    . STONE EXTRACTION WITH BASKET Left 07/18/2016   Procedure: STONE EXTRACTION WITH BASKET;  Surgeon: Vanna ScotlandAshley Brandon, MD;  Location: [facility];  Service: Urology;  Laterality: Left;  . URETEROSCOPY Left 03/29/2016   Procedure: URETEROSCOPY;  Surgeon: Vanna ScotlandAshley Brandon, MD;  Location: [facility];  Service: Urology;  Laterality: Left;  . URETEROSCOPY WITH HOLMIUM LASER LITHOTRIPSY Right 03/29/2016   Procedure: URETEROSCOPY WITH HOLMIUM LASER LITHOTRIPSY;  Surgeon: Vanna ScotlandAshley Brandon, MD;  Location: [facility];  Service: Urology;  Laterality: Right;       Home Medications    Prior to Admission medications   Medication Sig Start Date End Date Taking? Authorizing Provider  acetaminophen (TYLENOL) 500 MG tablet Take 500-1,000 mg by mouth every 6 (six) hours as needed (for pain.).   Yes Historical Provider, MD  docusate sodium (COLACE) 100 MG capsule Take 1 capsule (100 mg total) by mouth 2 (two) times daily. 07/18/16  Yes Vanna ScotlandAshley Brandon, MD  HYDROcodone-acetaminophen (NORCO/VICODIN) 5-325 MG tablet Take 1-2 tablets by mouth every 6 (six) hours as needed for moderate pain. 07/18/16  Yes Vanna ScotlandAshley Brandon, MD  ibuprofen (ADVIL,MOTRIN) 200 MG tablet Take 200-400 mg by mouth every 8 (eight) hours as needed (for pain).   Yes Historical Provider,  MD  omeprazole (PRILOSEC) 20 MG capsule Take 20 mg by mouth at bedtime.  12/30/15 12/29/16 Yes Historical Provider, MD  oxybutynin (DITROPAN) 5 MG tablet Take 1 tablet (5 mg total) by mouth every 8 (eight) hours as needed for bladder spasms. 07/18/16  Yes Vanna Scotland, MD  sulfamethoxazole-trimethoprim (BACTRIM DS,SEPTRA DS) 800-160 MG tablet  Take 1 tablet by mouth 2 (two) times daily. 07/19/16  Yes Vanna Scotland, MD  tamsulosin (FLOMAX) 0.4 MG CAPS capsule Take 1 capsule (0.4 mg total) by mouth daily. 06/28/16  Yes Shannon A McGowan, PA-C  fluconazole (DIFLUCAN) 100 MG tablet Take 1 tablet (100 mg total) by mouth daily. 07/18/16   Vanna Scotland, MD    Family History Family History  Problem Relation Age of Onset  . Prostate cancer Father   . Kidney disease Mother   . Kidney cancer Neg Hx   . Bladder Cancer Neg Hx     Social History Social History  Substance Use Topics  . Smoking status: Never Smoker  . Smokeless tobacco: Never Used  . Alcohol use 0.0 oz/week     Comment: occ.      Allergies   Penicillins   Review of Systems Review of Systems  Constitutional: Positive for chills. Negative for fever (Subjective).  HENT: Positive for sinus pressure. Negative for sore throat and tinnitus.   Eyes: Negative for redness.  Respiratory: Negative for cough and shortness of breath.   Cardiovascular: Negative for chest pain and palpitations.  Gastrointestinal: Negative for abdominal pain, diarrhea, nausea and vomiting.  Genitourinary: Positive for frequency and hematuria. Negative for dysuria and urgency.  Musculoskeletal: Negative for myalgias.  Skin: Negative for rash.       No lesions  Neurological: Negative for weakness.  Hematological: Does not bruise/bleed easily.  Psychiatric/Behavioral: Negative for suicidal ideas.     Physical Exam Triage Vital Signs ED Triage Vitals  Enc Vitals Group     BP 07/27/16 1307 116/88     Pulse Rate 07/27/16 1307 91     Resp 07/27/16 1307 18     Temp 07/27/16 1307 98.2 F (36.8 C)     Temp Source 07/27/16 1307 Oral     SpO2 07/27/16 1307 99 %     Weight 07/27/16 1308 199 lb (90.3 kg)     Height 07/27/16 1308 5\' 6"  (1.676 m)     Head Circumference --      Peak Flow --      Pain Score 07/27/16 1311 3     Pain Loc --      Pain Edu? --      Excl. in GC? --    No data  found.   Updated Vital Signs BP 116/88 (BP Location: Left Arm)   Pulse 91   Temp 98.2 F (36.8 C) (Oral)   Resp 18   Ht 5\' 6"  (1.676 m)   Wt 199 lb (90.3 kg)   SpO2 99%   BMI 32.12 kg/m   Visual Acuity Right Eye Distance:   Left Eye Distance:   Bilateral Distance:    Right Eye Near:   Left Eye Near:    Bilateral Near:     Physical Exam  Constitutional: He is oriented to person, place, and time. He appears well-developed and well-nourished. No distress.  HENT:  Head: Normocephalic and atraumatic.  Mouth/Throat: Oropharynx is clear and moist.  Eyes: Conjunctivae and EOM are normal. Pupils are equal, round, and reactive to light. No scleral icterus.  Neck: Normal range  of motion. Neck supple. No JVD present. No tracheal deviation present. No thyromegaly present.  Cardiovascular: Normal rate, regular rhythm and normal heart sounds.  Exam reveals no gallop and no friction rub.   No murmur heard. Pulmonary/Chest: Effort normal and breath sounds normal. No respiratory distress.  Abdominal: Soft. Bowel sounds are normal. He exhibits no distension. There is no tenderness.  Musculoskeletal: Normal range of motion. He exhibits no edema.  Lymphadenopathy:    He has no cervical adenopathy.  Neurological: He is alert and oriented to person, place, and time. No cranial nerve deficit.  Skin: Skin is warm and dry. No rash noted. No erythema.  Psychiatric: He has a normal mood and affect. His behavior is normal. Judgment and thought content normal.     UC Treatments / Results  Labs (all labs ordered are listed, but only abnormal results are displayed) Labs Reviewed  URINALYSIS, COMPLETE (UACMP) WITH MICROSCOPIC - Abnormal; Notable for the following:       Result Value   Color, Urine AMBER (*)    APPearance CLOUDY (*)    Specific Gravity, Urine >1.030 (*)    Glucose, UA 100 (*)    Hgb urine dipstick LARGE (*)    Bilirubin Urine LARGE (*)    Ketones, ur TRACE (*)    Protein, ur  >300 (*)    Leukocytes, UA TRACE (*)    Squamous Epithelial / LPF 0-5 (*)    Bacteria, UA RARE (*)    All other components within normal limits    EKG  EKG Interpretation None       Radiology No results found.  Procedures Procedures (including critical care time)  Medications Ordered in UC Medications - No data to display   Initial Impression / Assessment and Plan / UC Course  I have reviewed the triage vital signs and the nursing notes.  Pertinent labs & imaging results that were available during my care of the patient were reviewed by me and considered in my medical decision making (see chart for details).  Clinical Course     The patient just completed Bactrim last night. Obtaining urine culture at this time may be low yield even if an organism not susceptible to current antibiotics is present as antibiotics can suppress growth. Review of the patient's previous urine cultures show Candida parapsilosis, which although common, can potentially be virulent. We'll obtain fungal blood cultures. Patient to return for urine culture tomorrow once he is off antibiotics for 24 hours. Currently does not meet criteria for sepsis.  Final Clinical Impressions(s) / UC Diagnoses   Final diagnoses:  Candidiasis    New Prescriptions New Prescriptions   No medications on file     Arnaldo NatalMichael S Sophiarose Eades, MD 07/27/16 1414

## 2016-08-04 ENCOUNTER — Other Ambulatory Visit: Payer: Self-pay | Admitting: *Deleted

## 2016-08-04 ENCOUNTER — Other Ambulatory Visit: Payer: BLUE CROSS/BLUE SHIELD | Admitting: Urology

## 2016-08-04 DIAGNOSIS — N200 Calculus of kidney: Secondary | ICD-10-CM

## 2016-08-05 ENCOUNTER — Telehealth: Payer: Self-pay | Admitting: Radiology

## 2016-08-05 ENCOUNTER — Other Ambulatory Visit
Admission: RE | Admit: 2016-08-05 | Discharge: 2016-08-05 | Disposition: A | Discharge status: Home / Self Care | Payer: BLUE CROSS/BLUE SHIELD | Source: Ambulatory Visit | Attending: Urology | Admitting: Urology

## 2016-08-05 ENCOUNTER — Encounter: Payer: Self-pay | Admitting: Urology

## 2016-08-05 ENCOUNTER — Ambulatory Visit (INDEPENDENT_AMBULATORY_CARE_PROVIDER_SITE_OTHER): Payer: BLUE CROSS/BLUE SHIELD | Admitting: Urology

## 2016-08-05 VITALS — BP 130/72 | HR 71 | Ht 66.0 in | Wt 188.0 lb

## 2016-08-05 DIAGNOSIS — Z960 Presence of urogenital implants: Secondary | ICD-10-CM | POA: Diagnosis not present

## 2016-08-05 DIAGNOSIS — N200 Calculus of kidney: Secondary | ICD-10-CM | POA: Diagnosis present

## 2016-08-05 LAB — URINALYSIS, COMPLETE (UACMP) WITH MICROSCOPIC
Glucose, UA: NEGATIVE mg/dL
KETONES UR: NEGATIVE mg/dL
Nitrite: NEGATIVE
PH: 5.5 (ref 5.0–8.0)
SQUAMOUS EPITHELIAL / LPF: NONE SEEN
Specific Gravity, Urine: >1.03 — ABNORMAL HIGH (ref 1.005–1.030)

## 2016-08-05 MED ORDER — CIPROFLOXACIN HCL 500 MG PO TABS
500.0000 mg | ORAL_TABLET | Freq: Once | ORAL | Status: AC
  Administered 2016-08-05: 500 mg via ORAL

## 2016-08-05 MED ORDER — LIDOCAINE HCL 2 % EX GEL
1.0000 "application " | Freq: Once | CUTANEOUS | Status: AC
  Administered 2016-08-05: 1 via URETHRAL

## 2016-08-05 NOTE — Progress Notes (Signed)
   08/05/16  CC:  Chief Complaint  Patient presents with  . Cysto Stent Removal    HPI: 46-year-old male with a history of left proximal ureteral stone, 3 mm embedded into the mucosa as well as a nonobstructing stone who underwent ureteroscopy on 07/18/2016. He returns to the office today for cystoscopy, stent removal. He is quite anxious to have his stent removed.  He denies any dysuria, gross hematuria, but does have some baseline urgency and frequency related to the stent. No fevers or chills.  Blood pressure 130/72, pulse 71, height 5' 6" (1.676 m), weight 188 lb (85.3 kg). NED. A&Ox3.   No respiratory distress   Abd soft, NT, ND Normal phallus with bilateral descended testicles  Cystoscopy Procedure Note  Patient identification was confirmed, informed consent was obtained, and patient was prepped using Betadine solution.  Lidocaine jelly was administered per urethral meatus.    Preoperative abx where received prior to procedure.     Pre-Procedure: - Inspection reveals a normal caliber ureteral meatus.  Procedure: The flexible cystoscope was introduced without difficulty -Initially, the urine was cloudy appearing visualization -Patient voided spontaneously around the scope which improved visualization with refilling -Due to technical difficulties both with the scope and the stent grasper, was unable to complete the procedures today  Scope was removed.   Post-Procedure: - Patient tolerated the procedure well  Assessment/ Plan:  1. Retained ureteral stent Unsuccessful stent removal today due to technical issues  He was offered return next week for a second attempt in the office versus proceeding to the operating room possibly Monday under sedation Risk and benefits of each were discussed, his most excision proceeding to the operating room for this  Sundee Garland, MD   

## 2016-08-05 NOTE — Telephone Encounter (Signed)
Notified pt of cysto stent removal in OR on 08/08/16 with arrival time to pre-admit testing at 2:00 for registration prior to procedure. Advised pt to be npo after mn. Pt voices understanding.

## 2016-08-08 ENCOUNTER — Encounter: Payer: Self-pay | Admitting: *Deleted

## 2016-08-08 ENCOUNTER — Other Ambulatory Visit: Payer: Self-pay | Admitting: Radiology

## 2016-08-08 ENCOUNTER — Ambulatory Visit: Payer: BLUE CROSS/BLUE SHIELD | Admitting: Anesthesiology

## 2016-08-08 ENCOUNTER — Ambulatory Visit
Admission: AD | Admit: 2016-08-08 | Discharge: 2016-08-08 | Disposition: A | Discharge status: Home / Self Care | Payer: BLUE CROSS/BLUE SHIELD | Source: Ambulatory Visit | Attending: Urology | Admitting: Urology

## 2016-08-08 ENCOUNTER — Encounter
Admission: AD | Disposition: A | Discharge status: Home / Self Care | Payer: Self-pay | Source: Ambulatory Visit | Attending: Urology

## 2016-08-08 DIAGNOSIS — Z880 Allergy status to penicillin: Secondary | ICD-10-CM | POA: Diagnosis not present

## 2016-08-08 DIAGNOSIS — E669 Obesity, unspecified: Secondary | ICD-10-CM | POA: Diagnosis not present

## 2016-08-08 DIAGNOSIS — Z6830 Body mass index (BMI) 30.0-30.9, adult: Secondary | ICD-10-CM | POA: Insufficient documentation

## 2016-08-08 DIAGNOSIS — K219 Gastro-esophageal reflux disease without esophagitis: Secondary | ICD-10-CM | POA: Diagnosis not present

## 2016-08-08 DIAGNOSIS — Z9884 Bariatric surgery status: Secondary | ICD-10-CM | POA: Diagnosis not present

## 2016-08-08 DIAGNOSIS — Z466 Encounter for fitting and adjustment of urinary device: Secondary | ICD-10-CM | POA: Diagnosis not present

## 2016-08-08 DIAGNOSIS — N200 Calculus of kidney: Secondary | ICD-10-CM

## 2016-08-08 DIAGNOSIS — Z87442 Personal history of urinary calculi: Secondary | ICD-10-CM | POA: Diagnosis not present

## 2016-08-08 DIAGNOSIS — T83192A Other mechanical complication of urinary stent, initial encounter: Secondary | ICD-10-CM | POA: Diagnosis not present

## 2016-08-08 HISTORY — PX: CYSTOSCOPY W/ URETERAL STENT REMOVAL: SHX1430

## 2016-08-08 HISTORY — PX: CYSTOSCOPY W/ RETROGRADES: SHX1426

## 2016-08-08 SURGERY — REMOVAL, STENT, URETER, CYSTOSCOPIC
Anesthesia: General | Site: Ureter | Laterality: Left | Wound class: Clean Contaminated

## 2016-08-08 MED ORDER — CIPROFLOXACIN IN D5W 400 MG/200ML IV SOLN
INTRAVENOUS | Status: AC
  Administered 2016-08-08: 400 mg via INTRAVENOUS
  Filled 2016-08-08: qty 200

## 2016-08-08 MED ORDER — CIPROFLOXACIN IN D5W 400 MG/200ML IV SOLN
400.0000 mg | INTRAVENOUS | Status: AC
  Administered 2016-08-08: 400 mg via INTRAVENOUS

## 2016-08-08 MED ORDER — MIDAZOLAM HCL 2 MG/2ML IJ SOLN
INTRAMUSCULAR | Status: AC
  Filled 2016-08-08: qty 2

## 2016-08-08 MED ORDER — FENTANYL CITRATE (PF) 100 MCG/2ML IJ SOLN: 25.0000 ug | INTRAMUSCULAR | Status: DC | PRN

## 2016-08-08 MED ORDER — PROPOFOL 500 MG/50ML IV EMUL
INTRAVENOUS | Status: DC | PRN
  Administered 2016-08-08: 140 ug/kg/min via INTRAVENOUS

## 2016-08-08 MED ORDER — PROMETHAZINE HCL 25 MG/ML IJ SOLN: 6.2500 mg | INTRAMUSCULAR | Status: DC | PRN

## 2016-08-08 MED ORDER — FENTANYL CITRATE (PF) 100 MCG/2ML IJ SOLN
INTRAMUSCULAR | Status: AC
  Filled 2016-08-08: qty 2

## 2016-08-08 MED ORDER — MIDAZOLAM HCL 2 MG/2ML IJ SOLN
INTRAMUSCULAR | Status: DC | PRN
  Administered 2016-08-08: 2 mg via INTRAVENOUS

## 2016-08-08 MED ORDER — PROPOFOL 10 MG/ML IV BOLUS
INTRAVENOUS | Status: DC | PRN
  Administered 2016-08-08 (×2): 40 mg via INTRAVENOUS
  Administered 2016-08-08: 20 mg via INTRAVENOUS

## 2016-08-08 MED ORDER — MEPERIDINE HCL 25 MG/ML IJ SOLN: 6.2500 mg | INTRAMUSCULAR | Status: DC | PRN

## 2016-08-08 MED ORDER — LACTATED RINGERS IV SOLN
INTRAVENOUS | Status: DC | PRN
  Administered 2016-08-08: 16:00:00 via INTRAVENOUS

## 2016-08-08 MED ORDER — FENTANYL CITRATE (PF) 100 MCG/2ML IJ SOLN
INTRAMUSCULAR | Status: DC | PRN
  Administered 2016-08-08: 50 ug via INTRAVENOUS

## 2016-08-08 MED ORDER — LIDOCAINE 2% (20 MG/ML) 5 ML SYRINGE
INTRAMUSCULAR | Status: AC
  Filled 2016-08-08: qty 5

## 2016-08-08 SURGICAL SUPPLY — 12 items
BAG DRAIN CYSTO-URO LG1000N (MISCELLANEOUS) ×2 IMPLANT
GLOVE BIO SURGEON STRL SZ 6.5 (GLOVE) ×2 IMPLANT
GOWN STRL REUS W/ TWL LRG LVL3 (GOWN DISPOSABLE) ×2 IMPLANT
GOWN STRL REUS W/TWL LRG LVL3 (GOWN DISPOSABLE) ×2
PACK CYSTO AR (MISCELLANEOUS) ×2 IMPLANT
SENSORWIRE 0.038 NOT ANGLED (WIRE) ×2
SET CYSTO W/LG BORE CLAMP LF (SET/KITS/TRAYS/PACK) ×2 IMPLANT
SOL .9 NS 3000ML IRR  AL (IV SOLUTION) ×1
SOL .9 NS 3000ML IRR UROMATIC (IV SOLUTION) ×1 IMPLANT
SURGILUBE 2OZ TUBE FLIPTOP (MISCELLANEOUS) ×2 IMPLANT
WATER STERILE IRR 1000ML POUR (IV SOLUTION) ×2 IMPLANT
WIRE SENSOR 0.038 NOT ANGLED (WIRE) ×1 IMPLANT

## 2016-08-08 NOTE — Anesthesia Preprocedure Evaluation (Signed)
Anesthesia Evaluation  Patient identified by MRN, date of birth, ID band Patient awake    Reviewed: Allergy & Precautions, NPO status , Patient's Chart, lab work & pertinent test results  History of Anesthesia Complications Negative for: history of anesthetic complications  Airway Mallampati: II  TM Distance: >3 FB Neck ROM: Full    Dental no notable dental hx.    Pulmonary neg pulmonary ROS, Sleep apnea: hx of OSA, but no longer. , neg COPD,    breath sounds clear to auscultation- rhonchi (-) wheezing      Cardiovascular Exercise Tolerance: Good (-) hypertension(-) CAD and (-) Past MI  Rhythm:Regular Rate:Normal - Systolic murmurs and - Diastolic murmurs    Neuro/Psych negative neurological ROS  negative psych ROS   GI/Hepatic Neg liver ROS, GERD  ,  Endo/Other  negative endocrine ROSneg diabetes  Renal/GU Renal disease: nephrolithiasis.     Musculoskeletal negative musculoskeletal ROS (+)   Abdominal (+) + obese,   Peds  Hematology negative hematology ROS (+)   Anesthesia Other Findings Past Medical History: No date: Acid reflux No date: Hematuria No date: Kidney stones No date: Sleep apnea     Comment: HAD GASTRIC SLEEVE AND HAS COME OFF OF CPAP No date: Urolithiasis   Reproductive/Obstetrics                             Anesthesia Physical Anesthesia Plan  ASA: II  Anesthesia Plan: General   Post-op Pain Management:    Induction: Intravenous  Airway Management Planned: Natural Airway  Additional Equipment:   Intra-op Plan:   Post-operative Plan:   Informed Consent: I have reviewed the patients History and Physical, chart, labs and discussed the procedure including the risks, benefits and alternatives for the proposed anesthesia with the patient or authorized representative who has indicated his/her understanding and acceptance.   Dental advisory given  Plan  Discussed with: CRNA and Anesthesiologist  Anesthesia Plan Comments:         Anesthesia Quick Evaluation

## 2016-08-08 NOTE — H&P (View-Only) (Signed)
   08/05/16  CC:  Chief Complaint  Patient presents with  . Cysto Stent Removal    HPI: 46 year old male with a history of left proximal ureteral stone, 3 mm embedded into the mucosa as well as a nonobstructing stone who underwent ureteroscopy on 07/18/2016. He returns to the office today for cystoscopy, stent removal. He is quite anxious to have his stent removed.  He denies any dysuria, gross hematuria, but does have some baseline urgency and frequency related to the stent. No fevers or chills.  Blood pressure 130/72, pulse 71, height 5\' 6"  (1.676 m), weight 188 lb (85.3 kg). NED. A&Ox3.   No respiratory distress   Abd soft, NT, ND Normal phallus with bilateral descended testicles  Cystoscopy Procedure Note  Patient identification was confirmed, informed consent was obtained, and patient was prepped using Betadine solution.  Lidocaine jelly was administered per urethral meatus.    Preoperative abx where received prior to procedure.     Pre-Procedure: - Inspection reveals a normal caliber ureteral meatus.  Procedure: The flexible cystoscope was introduced without difficulty -Initially, the urine was cloudy appearing visualization -Patient voided spontaneously around the scope which improved visualization with refilling -Due to technical difficulties both with the scope and the stent grasper, was unable to complete the procedures today  Scope was removed.   Post-Procedure: - Patient tolerated the procedure well  Assessment/ Plan:  1. Retained ureteral stent Unsuccessful stent removal today due to technical issues  He was offered return next week for a second attempt in the office versus proceeding to the operating room possibly Monday under sedation Risk and benefits of each were discussed, his most excision proceeding to the operating room for this  Vanna ScotlandAshley Kalyn Dimattia, MD

## 2016-08-08 NOTE — Discharge Instructions (Signed)

## 2016-08-08 NOTE — Anesthesia Postprocedure Evaluation (Signed)
Anesthesia Post Note  Patient: Lucila Maineeter [place]  Procedure(s) Performed: Procedure(s) (LRB): CYSTOSCOPY WITH STENT REMOVAL (Left) CYSTOSCOPY WITH RETROGRADE PYELOGRAM (Left)  Patient location during evaluation: PACU Anesthesia Type: General Level of consciousness: awake and alert Pain management: pain level controlled Vital Signs Assessment: post-procedure vital signs reviewed and stable Respiratory status: spontaneous breathing and respiratory function stable Cardiovascular status: stable Anesthetic complications: no     Last Vitals:  Vitals:   08/08/16 1630 08/08/16 1640  BP: 125/86 120/82  Pulse:  75  Resp:  19  Temp: (!) 36 C     Last Pain:  Vitals:   08/08/16 1335  TempSrc: Oral  PainSc: 2                  KEPHART,WILLIAM K

## 2016-08-08 NOTE — Interval H&P Note (Signed)
History and Physical Interval Note:  08/08/2016 3:52 PM  [name]  has presented today for surgery, with the diagnosis of nephrolithiasis  The various methods of treatment have been discussed with the patient and family. After consideration of risks, benefits and other options for treatment, the patient has consented to  Procedure(s): CYSTOSCOPY WITH STENT REMOVAL (Left) as a surgical intervention .  The patient's history has been reviewed, patient examined, no change in status, stable for surgery.  I have reviewed the patient's chart and labs.  Questions were answered to the patient's satisfaction.    RRR CTAB  Will also perform retrograde  [name]

## 2016-08-08 NOTE — Transfer of Care (Signed)
Immediate Anesthesia Transfer of Care Note  Patient: [name]  Procedure(s) Performed: Procedure(s): CYSTOSCOPY WITH STENT REMOVAL (Left) CYSTOSCOPY WITH RETROGRADE PYELOGRAM (Left)  Patient Location: PACU  Anesthesia Type:General  Level of Consciousness: awake  Airway & Oxygen Therapy: Patient Spontanous Breathing and Patient connected to face mask oxygen  Post-op Assessment: Report given to RN and Post -op Vital signs reviewed and stable  Post vital signs: Reviewed and stable  Last Vitals:  Vitals:   08/08/16 1335 08/08/16 1630  BP: 122/84 125/86  Pulse: 68   Resp: 16   Temp: 36.7 C (!) 36 C    Last Pain:  Vitals:   08/08/16 1335  TempSrc: Oral  PainSc: 2          Complications: No apparent anesthesia complications

## 2016-08-08 NOTE — Anesthesia Procedure Notes (Signed)
Date/Time: 08/08/2016 4:06 PM Performed by: Henrietta HooverPOPE, Dalon Reichart Pre-anesthesia Checklist: Patient identified, Emergency Drugs available, Suction available, Patient being monitored and Timeout performed Patient Re-evaluated:Patient Re-evaluated prior to inductionOxygen Delivery Method: Nasal cannula

## 2016-08-09 ENCOUNTER — Encounter: Payer: Self-pay | Admitting: Urology

## 2016-08-09 ENCOUNTER — Telehealth: Payer: Self-pay | Admitting: Urology

## 2016-08-09 ENCOUNTER — Telehealth: Payer: Self-pay

## 2016-08-09 DIAGNOSIS — N200 Calculus of kidney: Secondary | ICD-10-CM

## 2016-08-09 NOTE — Telephone Encounter (Signed)
Per Dr. Marlou PorchHerrick pt can return to work. Pt should just monitor for bleeding. Nurse corrected self and made pt aware there is no stent in place. Pt voiced understanding.

## 2016-08-09 NOTE — Op Note (Signed)
Date of procedure: 08/08/16  Preoperative diagnosis:  1. Retained left ureteral stent   Postoperative diagnosis:  1. Retained left ureteral stent   Procedure: 1. Cystoscopy with stent removal 2. Left retrograde pyelogram  Surgeon: Vanna ScotlandAshley Merl Bommarito, MD  Anesthesia: Monitored anesthesia care  Complications: None  Intraoperative findings: Bullous edema surrounding the ureteral orifice with mild debris within the bladder. Stent removed without difficulty.  Retrograde pyelogram showed pursestring into the left kidney although somewhat cobblestoning appearance of the ureter consistent with likely ureteral edema. No hydroureteronephrosis appreciated.    EBL: minimal   Specimens: none  Drains: none  Indication: [name] is a 46 y.o. patient with left kidney stone status post ureteroscopy with a retained left ureteral stent which was difficult to remove in the office therefore presents today to have this removed in the operating room under monitored anesthesia care..  After reviewing the management options for treatment, he elected to proceed with the above surgical procedure(s). We have discussed the potential benefits and risks of the procedure, side effects of the proposed treatment, the likelihood of the patient achieving the goals of the procedure, and any potential problems that might occur during the procedure or recuperation. Informed consent has been obtained.  Description of procedure:  The patient was taken to the operating room and anesthesia was induced.  The patient was placed in the dorsal lithotomy position, prepped and draped in the usual sterile fashion, and preoperative antibiotics were administered. A preoperative time-out was performed.   21 French cystoscope was advanced per urethra into the bladder. Of note, the bladder had some debris and bullous edema around the left UO. The distal coil of the stent was grasped using stent graspers and brought through the meatus  without difficulty removing the stent in entirety. This point in time, the left UO was cannulated using a 5 [place]French open-ended ureteral catheter and a gentle retrograde pyelogram was performed. This revealed a patent ureter with a diffuse cobblestoning type contour likely consistent with ureteral edema.    There was no evidence of hydronephrosis. The bladder was then drained and the scope was removed. The patient was repositioned the supine position, reversed from anesthesia, taken to the PACU in stable condition.  Plan: Patient will follow-up in 4 weeks with a renal ultrasound prior.   Vanna ScotlandAshley Militza Devery, M.D.

## 2016-08-09 NOTE — Telephone Encounter (Signed)
Patient is calling about his follow up w/ a RUS prior in 1 month? I donlt see an order for this yet. I know you are busy i'm see it in his OP notes so before we move forward I'm just checking.  Thanks,  [name] DusterMichelle

## 2016-08-09 NOTE — Telephone Encounter (Signed)
Please arrange his follow up for 4 weeks with RUS.  Please CANCEL his f/u next Thursday for path, that was a mistake.    [name], MD

## 2016-08-09 NOTE — Telephone Encounter (Signed)
Looks like RUS orders have been placed as future.

## 2016-08-09 NOTE — Telephone Encounter (Signed)
Pt called stating he has a work note stating that he is supposed to return to work [employer]orrow. 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 Please advise.

## 2016-08-10 NOTE — Telephone Encounter (Signed)
Thank you  Done  [name]

## 2016-08-24 ENCOUNTER — Ambulatory Visit
Admission: RE | Admit: 2016-08-24 | Discharge: 2016-08-24 | Disposition: A | Discharge status: Home / Self Care | Payer: BLUE CROSS/BLUE SHIELD | Source: Ambulatory Visit | Attending: Urology | Admitting: Urology

## 2016-08-24 DIAGNOSIS — N200 Calculus of kidney: Secondary | ICD-10-CM | POA: Insufficient documentation

## 2016-08-24 DIAGNOSIS — N281 Cyst of kidney, acquired: Secondary | ICD-10-CM | POA: Insufficient documentation

## 2016-09-01 ENCOUNTER — Encounter: Payer: Self-pay | Admitting: Urology

## 2016-09-09 ENCOUNTER — Encounter: Payer: Self-pay | Admitting: Urology

## 2016-09-09 ENCOUNTER — Ambulatory Visit (INDEPENDENT_AMBULATORY_CARE_PROVIDER_SITE_OTHER): Payer: BLUE CROSS/BLUE SHIELD | Admitting: Urology

## 2016-09-09 VITALS — BP 121/70 | HR 62 | Ht 66.0 in | Wt 188.0 lb

## 2016-09-09 DIAGNOSIS — N200 Calculus of kidney: Secondary | ICD-10-CM | POA: Diagnosis not present

## 2016-09-09 DIAGNOSIS — N281 Cyst of kidney, acquired: Secondary | ICD-10-CM

## 2016-09-09 NOTE — Progress Notes (Signed)
09/09/2016 12:23 PM   [name] 10/01/1970 [id number]  Referring provider: Dione HousekeeperMario Ernesto Olmedo, MD [address] [address] [place], [place] [id number]  Chief Complaint  Patient presents with  . Nephrolithiasis    1 month w/ renal u/s results    HPI: 46 year old male with history of gastric bypass with recurrent nephrolithiasis, microscopic hematuria, bilateral renal cysts. He presents today for routine follow-up proximal and one month following left ureteroscopy for a 3 mm proximal embedded ureteral stone.  Follow-up renal ultrasound today shows no evidence of residual hydroureteronephrosis and no stones bilaterally. He does have multiple bilateral renal cysts one of which is calcified on the left. Previous CT urogram shows no evidence of enhancing or suspicious renal lesion lesions, Bosniak I and II bilaterally.   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    He does admit to not drinking enough water and likely eats too much salt. Previous stone analysis shows 30% calcium oxalate dihydrate, 60% calcium oxalate monohydrate, 10% calcium phosphate.  He's never previously had a metabolic workup.   PMH: Past Medical History:  Diagnosis Date  . Acid reflux   . Hematuria   . Kidney stones   . Sleep apnea    HAD GASTRIC SLEEVE AND HAS COME OFF OF CPAP  . Urolithiasis     Surgical History: Past Surgical History:  Procedure Laterality Date  . CYSTOSCOPY W/ RETROGRADES Left 08/08/2016   Procedure: CYSTOSCOPY WITH RETROGRADE PYELOGRAM;  Surgeon: Vanna ScotlandAshley Otis Portal, MD;  Location: [facility];  Service: Urology;  Laterality: Left;  . CYSTOSCOPY W/ RETROGRADES Bilateral 03/05/2016   Procedure: CYSTOSCOPY WITH RETROGRADE PYELOGRAM;  Surgeon: Hildred LaserBrian James Budzyn, MD;  Location: [facility];  Service: Urology;  Laterality: Bilateral;  . CYSTOSCOPY W/ URETERAL STENT PLACEMENT Bilateral 03/29/2016   Procedure:  CYSTOSCOPY WITH STENT REPLACEMENT;  Surgeon: Vanna ScotlandAshley Shrinika Blatz, MD;  Location: [facility];  Service: Urology;  Laterality: Bilateral;  . CYSTOSCOPY W/ URETERAL STENT REMOVAL Left 08/08/2016   Procedure: CYSTOSCOPY WITH STENT REMOVAL;  Surgeon: Vanna ScotlandAshley Delyla Sandeen, MD;  Location: [facility];  Service: Urology;  Laterality: Left;  . CYSTOSCOPY WITH STENT PLACEMENT Left 07/18/2016   Procedure: CYSTOSCOPY WITH STENT PLACEMENT;  Surgeon: Vanna ScotlandAshley Bradford Cazier, MD;  Location: [facility];  Service: Urology;  Laterality: Left;  . CYSTOSCOPY WITH STENT PLACEMENT Bilateral 03/05/2016   Procedure: CYSTOSCOPY WITH STENT PLACEMENT;  Surgeon: Hildred LaserBrian James Budzyn, MD;  Location: [facility];  Service: Urology;  Laterality: Bilateral;  . CYSTOSCOPY/RETROGRADE/URETEROSCOPY Left 07/18/2016   Procedure: CYSTOSCOPY/RETROGRADE/URETEROSCOPY;  Surgeon: Vanna ScotlandAshley Terrika Zuver, MD;  Location: [facility];  Service: Urology;  Laterality: Left;  . EXTRACORPOREAL SHOCK WAVE LITHOTRIPSY Right 03/03/2016   Procedure: EXTRACORPOREAL SHOCK WAVE LITHOTRIPSY (ESWL);  Surgeon: Vanna ScotlandAshley Trinetta Alemu, MD;  Location: [facility];  Service: Urology;  Laterality: Right;  . KNEE SURGERY    . LAPAROSCOPIC GASTRIC SLEEVE RESECTION    . STONE EXTRACTION WITH BASKET Left 07/18/2016   Procedure: STONE EXTRACTION WITH BASKET;  Surgeon: Vanna ScotlandAshley Regana Kemple, MD;  Location: [facility];  Service: Urology;  Laterality: Left;  . URETEROSCOPY Left 03/29/2016   Procedure: URETEROSCOPY;  Surgeon: Vanna ScotlandAshley Evee Liska, MD;  Location: [facility];  Service: Urology;  Laterality: Left;  . URETEROSCOPY WITH HOLMIUM LASER LITHOTRIPSY Right 03/29/2016   Procedure: URETEROSCOPY WITH HOLMIUM LASER LITHOTRIPSY;  Surgeon: Vanna ScotlandAshley Winola Drum, MD;  Location: [facility];  Service: Urology;  Laterality: Right;    Home Medications:  Allergies as of 09/09/2016  Reactions   Penicillins Itching, Rash   Has patient had a PCN reaction causing immediate rash, facial/tongue/throat swelling, SOB or lightheadedness with hypotension: No Has  patient had a PCN reaction causing severe rash involving mucus membranes or skin necrosis: No Has patient had a PCN reaction that required hospitalization No Has patient had a PCN reaction occurring within the last 10 years: No If all of the above answers are "NO", then may proceed with Cephalosporin use.      Medication List       Accurate as of 09/09/16 11:59 PM. Always use your most recent med list.          acetaminophen 500 MG tablet Commonly known as:  TYLENOL Take 500-1,000 mg by mouth every 6 (six) hours as needed (for pain.).   ibuprofen 200 MG tablet Commonly known as:  ADVIL,MOTRIN Take 200-400 mg by mouth every 8 (eight) hours as needed (for pain).   omeprazole 20 MG capsule Commonly known as:  PRILOSEC Take 20 mg by mouth at bedtime.       Allergies:  Allergies  Allergen Reactions  . Penicillins Itching and Rash    Has patient had a PCN reaction causing immediate rash, facial/tongue/throat swelling, SOB or lightheadedness with hypotension: No Has patient had a PCN reaction causing severe rash involving mucus membranes or skin necrosis: No Has patient had a PCN reaction that required hospitalization No Has patient had a PCN reaction occurring within the last 10 years: No If all of the above answers are "NO", then may proceed with Cephalosporin use.    Family History: Family History  Problem Relation Age of Onset  . Prostate cancer Father   . Kidney disease Mother   . Kidney cancer Neg Hx   . Bladder Cancer Neg Hx     Social History:  reports that he has never smoked. He has never used smokeless tobacco. He reports that he drinks alcohol. He reports that he does not use drugs.  ROS: UROLOGY Frequent Urination?: No Hard to postpone urination?: No Burning/pain with urination?: No Get up at night to urinate?: No Leakage of urine?: No Urine stream starts and stops?: No Trouble starting stream?: No Do you have to strain to urinate?: No Blood in urine?:  No Urinary tract infection?: No Sexually transmitted disease?: No Injury to kidneys or bladder?: No Painful intercourse?: No Weak stream?: No Erection problems?: No Penile pain?: No  Gastrointestinal Nausea?: No Vomiting?: No Indigestion/heartburn?: No Diarrhea?: No Constipation?: No  Constitutional Fever: No Night sweats?: No Weight loss?: No Fatigue?: No  Skin Skin rash/lesions?: No Itching?: No  Eyes Blurred vision?: No Double vision?: No  Ears/Nose/Throat Sore throat?: No Sinus problems?: No  Hematologic/Lymphatic Swollen glands?: No Easy bruising?: No  Cardiovascular Leg swelling?: No Chest pain?: No  Respiratory Cough?: No Shortness of breath?: No  Endocrine Excessive thirst?: No  Musculoskeletal Back pain?: No Joint pain?: No  Neurological Headaches?: No Dizziness?: No  Psychologic Depression?: No Anxiety?: No  Physical Exam: BP 121/70   Pulse 62   Ht 5\' 6"  (1.676 m)   Wt 188 lb (85.3 kg)   BMI 30.34 kg/m   Constitutional:  Alert and oriented, No acute distress. HEENT: [place] AT, moist mucus membranes.  Trachea midline, no masses. Cardiovascular: No clubbing, cyanosis, or edema. Respiratory: Normal respiratory effort, no increased work of breathing. GI: Abdomen is soft, nontender, nondistended, no abdominal masses GU: No CVA tenderness. Skin: No rashes, bruises or suspicious lesions. Neurologic: Grossly intact, no focal  deficits, moving all 4 extremities. Psychiatric: Normal mood and affect.  Laboratory Data: Lab Results  Component Value Date   WBC 7.9 03/18/2016   HGB 12.3 (L) 03/18/2016   HCT 36.9 (L) 03/18/2016   MCV 81.5 03/18/2016   PLT 291 03/18/2016    Lab Results  Component Value Date   CREATININE 0.80 03/16/2016     Lab Results  Component Value Date   HGBA1C 5.7 04/11/2013    Urinalysis N/a --no voiding symptoms today  Pertinent Imaging: CLINICAL DATA:  History of kidney stones  EXAM: RENAL /  URINARY TRACT ULTRASOUND COMPLETE  COMPARISON:  Ultrasound the kidneys of 07/14/2016 and CT abdomen pelvis of 06/27/2016  FINDINGS: Right Kidney:  Length: 11.1 cm. No hydronephrosis is seen. A cyst is present in the upper pole of 1.1 cm.  Left Kidney:  Length: 12.3 cm. No hydronephrosis is seen. There may be an extrarenal pelvis present which is seen on prior CT. There are several cysts present in the lower pole, the largest of 2.2 cm. A hypoechoic structure in the mid lower kidney appears to contain some calcification. This was faintly seen on prior unenhanced CT.  Bladder:  The urinary bladder is moderately urine distended with bilateral ureteral jets noted.  IMPRESSION: 1. No hydronephrosis.  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 2. Bilateral renal cysts one of which appears partially calcified on the left. 3. No definite renal calculi are seen.   Electronically Signed   By: Dwyane Dee M.D.   On: 08/24/2016 16:57  RUS was personally reviewed today consistent were compared to CT urogram performed on 01/2016.  Assessment & Plan:   1. Recurrent nephrolithiasis Follow-up renal ultrasound shows that he is currently stone free without hydronephrosis Doing well, currently asymptomatic Recommend full metabolic evaluation although stones likely secondary to malabsorption related to gastric bypass Litholink instructions reviewed We discussed general stone prevention techniques including drinking plenty water with goal of producing 2.5 L urine daily, increased citric acid intake, avoidance of high oxalate containing foods, and decreased salt intake.  Information about dietary recommendations given today.   2. Renal cyst Bilateral Bosniak I and II renal cyst- no need for surveillance per AUA guidelines  Return in about 2 months (around 11/07/2016) for f/u litholink.  Vanna Scotland, MD  [facility] [address] [place], [place] [address] [phone]

## 2016-11-11 ENCOUNTER — Ambulatory Visit: Payer: BLUE CROSS/BLUE SHIELD | Admitting: Urology

## 2017-02-07 IMAGING — DX DG ABDOMEN 1V
1 series · 1 of 1 positions shown · non-contrast
Comparison: CT scan 03/04/2016

CLINICAL DATA: Kidney stone

EXAM:
ABDOMEN - 1 VIEW

[abdomen kub]
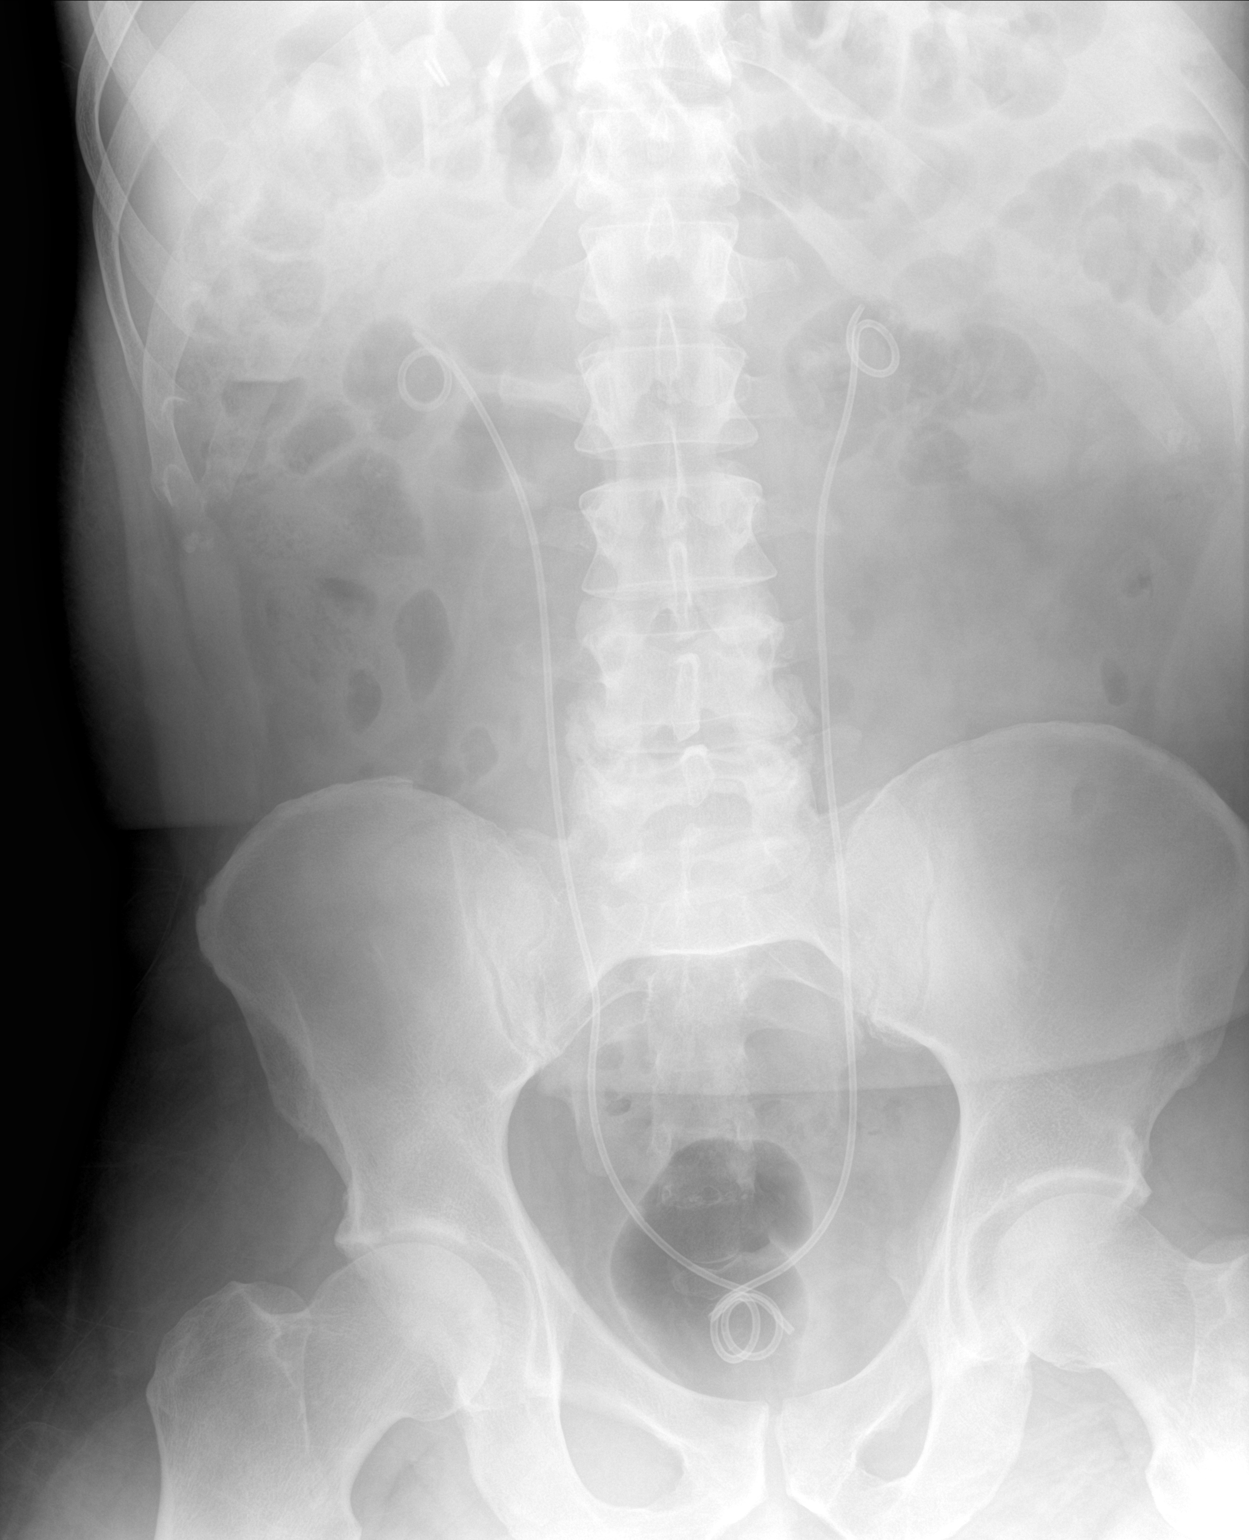

[1 of 1 positions shown; findings below may reference images not displayed]

FINDINGS: There is normal small bowel gas pattern. Study is limited by
moderate gas within small bowel and colon. Bilateral ureteral stent
in place. No definite renal calcifications are identified. Question
right ureteral calculus at the level of L3-L4 disc space measure
about 3 mm.
IMPRESSION: Study is limited by moderate gas within small bowel and colon.
Bilateral ureteral stent in place. No definite renal calcifications
are identified. Question right ureteral calculus at the level of
L3-L4 disc space measure about 3 mm.

## 2017-02-15 IMAGING — CR DG ABDOMEN 1V
1 series · 1 of 1 positions shown · non-contrast
Comparison: None.

CLINICAL DATA: History of bilateral kidney stones. Bilateral flank
pain and hematuria. Post lithotripsy.

EXAM:
ABDOMEN - 1 VIEW

[dg abd 1 view]
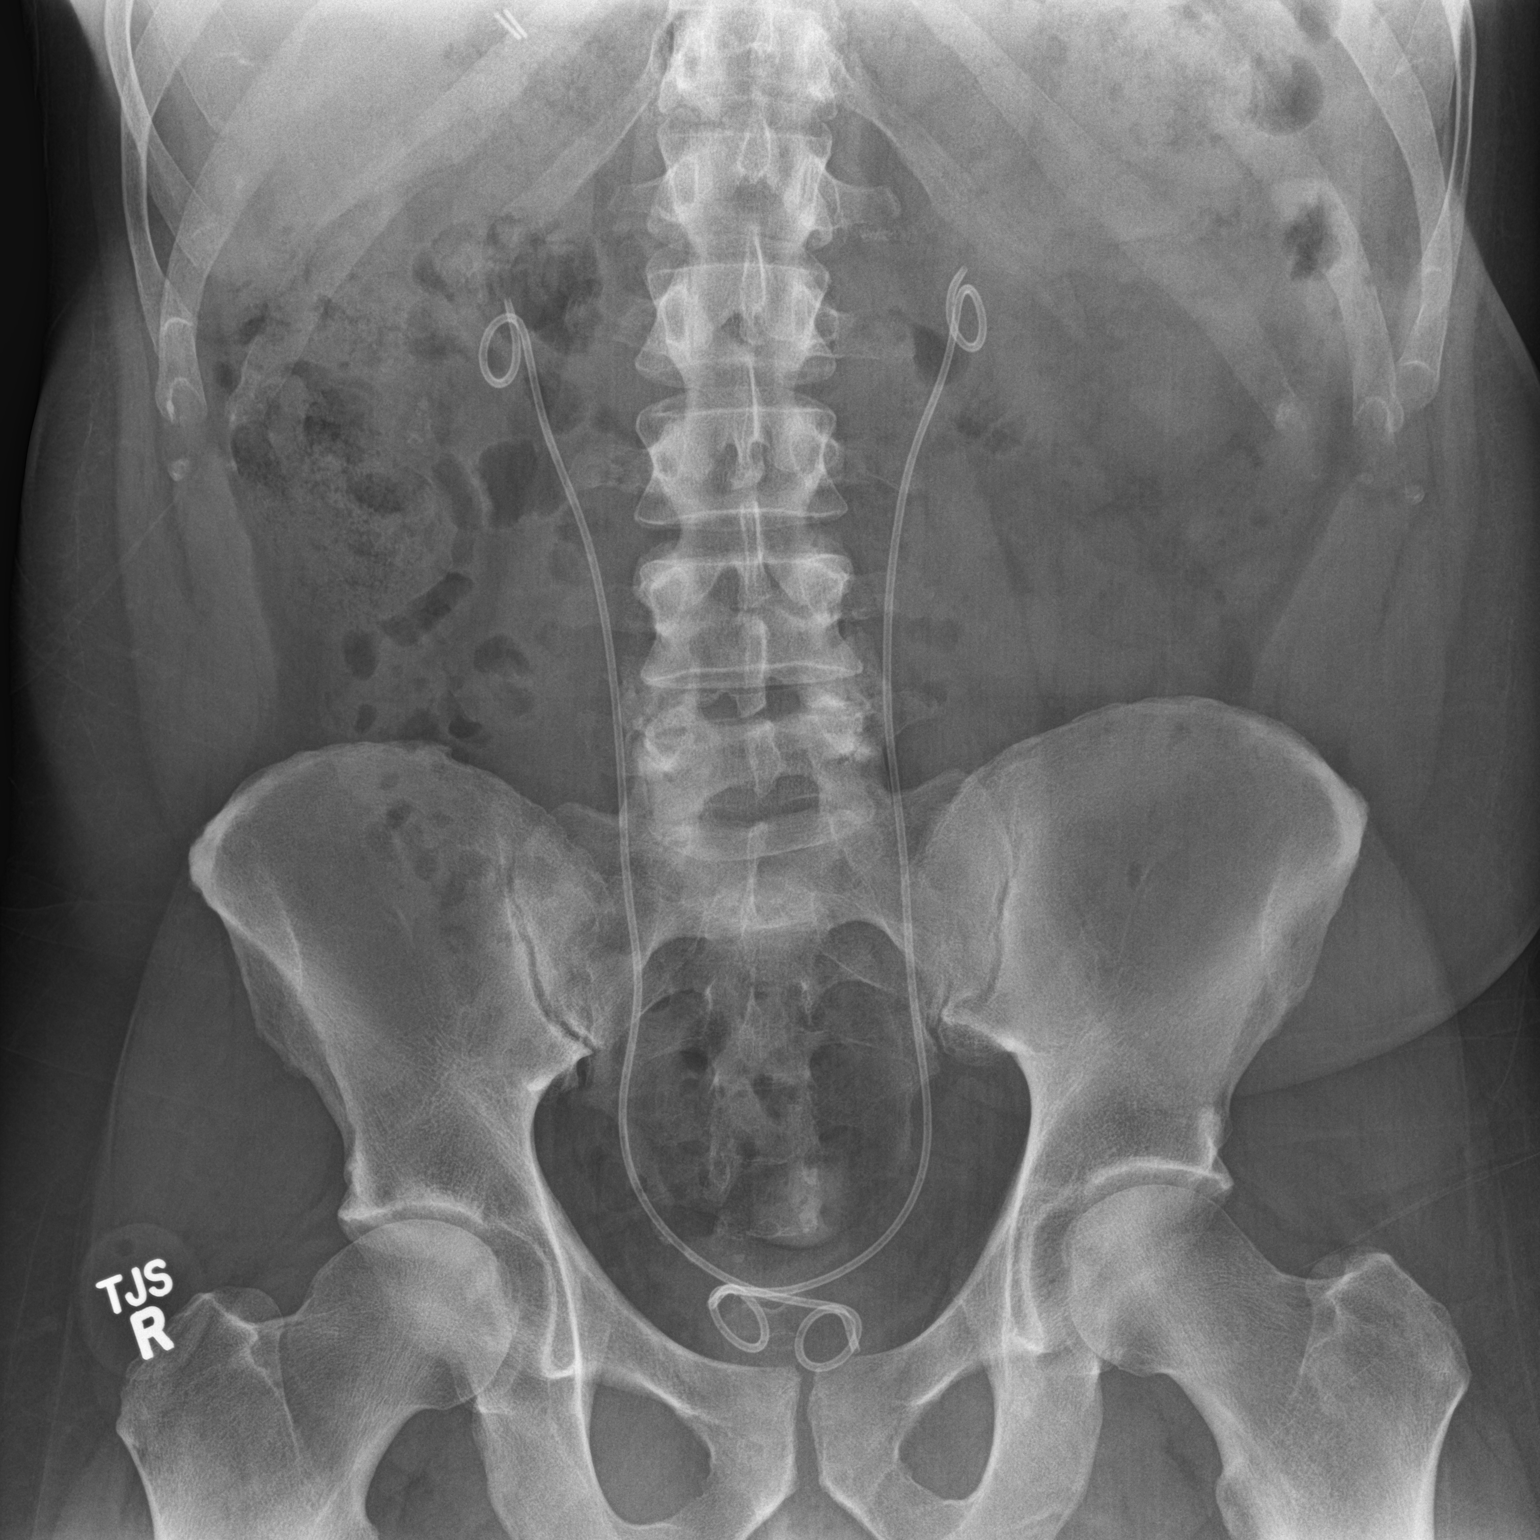

[1 of 1 positions shown; findings below may reference images not displayed]

FINDINGS: The bowel gas pattern is normal. No radio-opaque calculi or other
significant radiographic abnormality are seen.

Bilateral ureteral stents in satisfactory radiographic position.
Large amount of stool overlies the renal shadows and prevents the
evaluation for renal calculi.

Subtle few mm radiopaque densities seen alone midportion of
bilateral ureteral catheters which may represent ureteral stones.
Similar densities seen at the expected location of distal right
ureter and possibly within the urinary bladder.
IMPRESSION: Possible few mm bilateral ureteral stones.

Nonobstructive bowel gas pattern.

## 2017-06-04 IMAGING — CR DG ABDOMEN 1V
1 series · 2 of 2 positions shown · non-contrast
Comparison: CT abdomen and pelvis 06/27/2016.  KUB 07/06/2016.

CLINICAL DATA: History of urinary tract stones. Status post
lithotripsy 03/29/2016.

EXAM:
ABDOMEN - 1 VIEW

[Series 1: dg abd 1 view · 0.14mm/px · 2 of 2 slices shown]
[im 1/2]
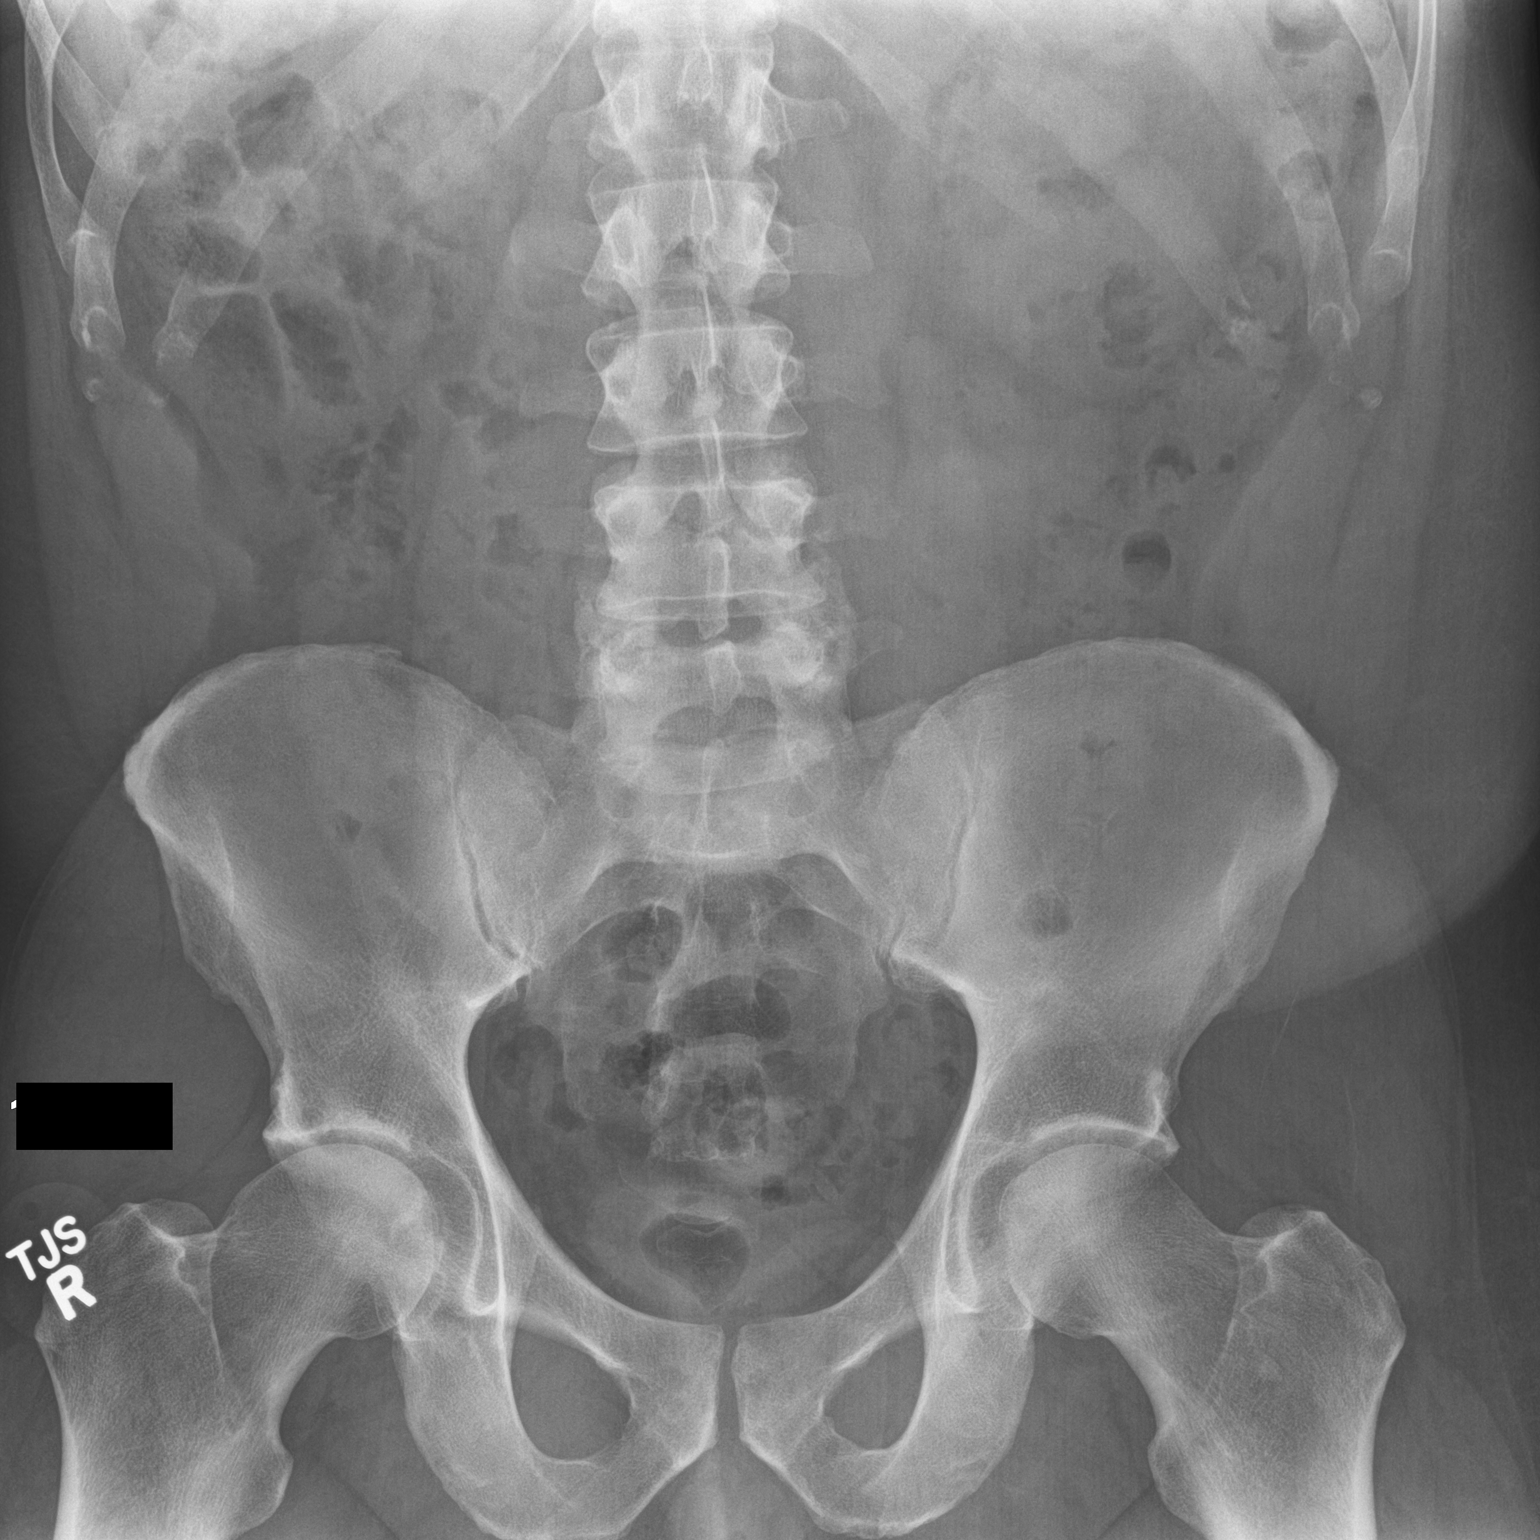
[im 2/2]
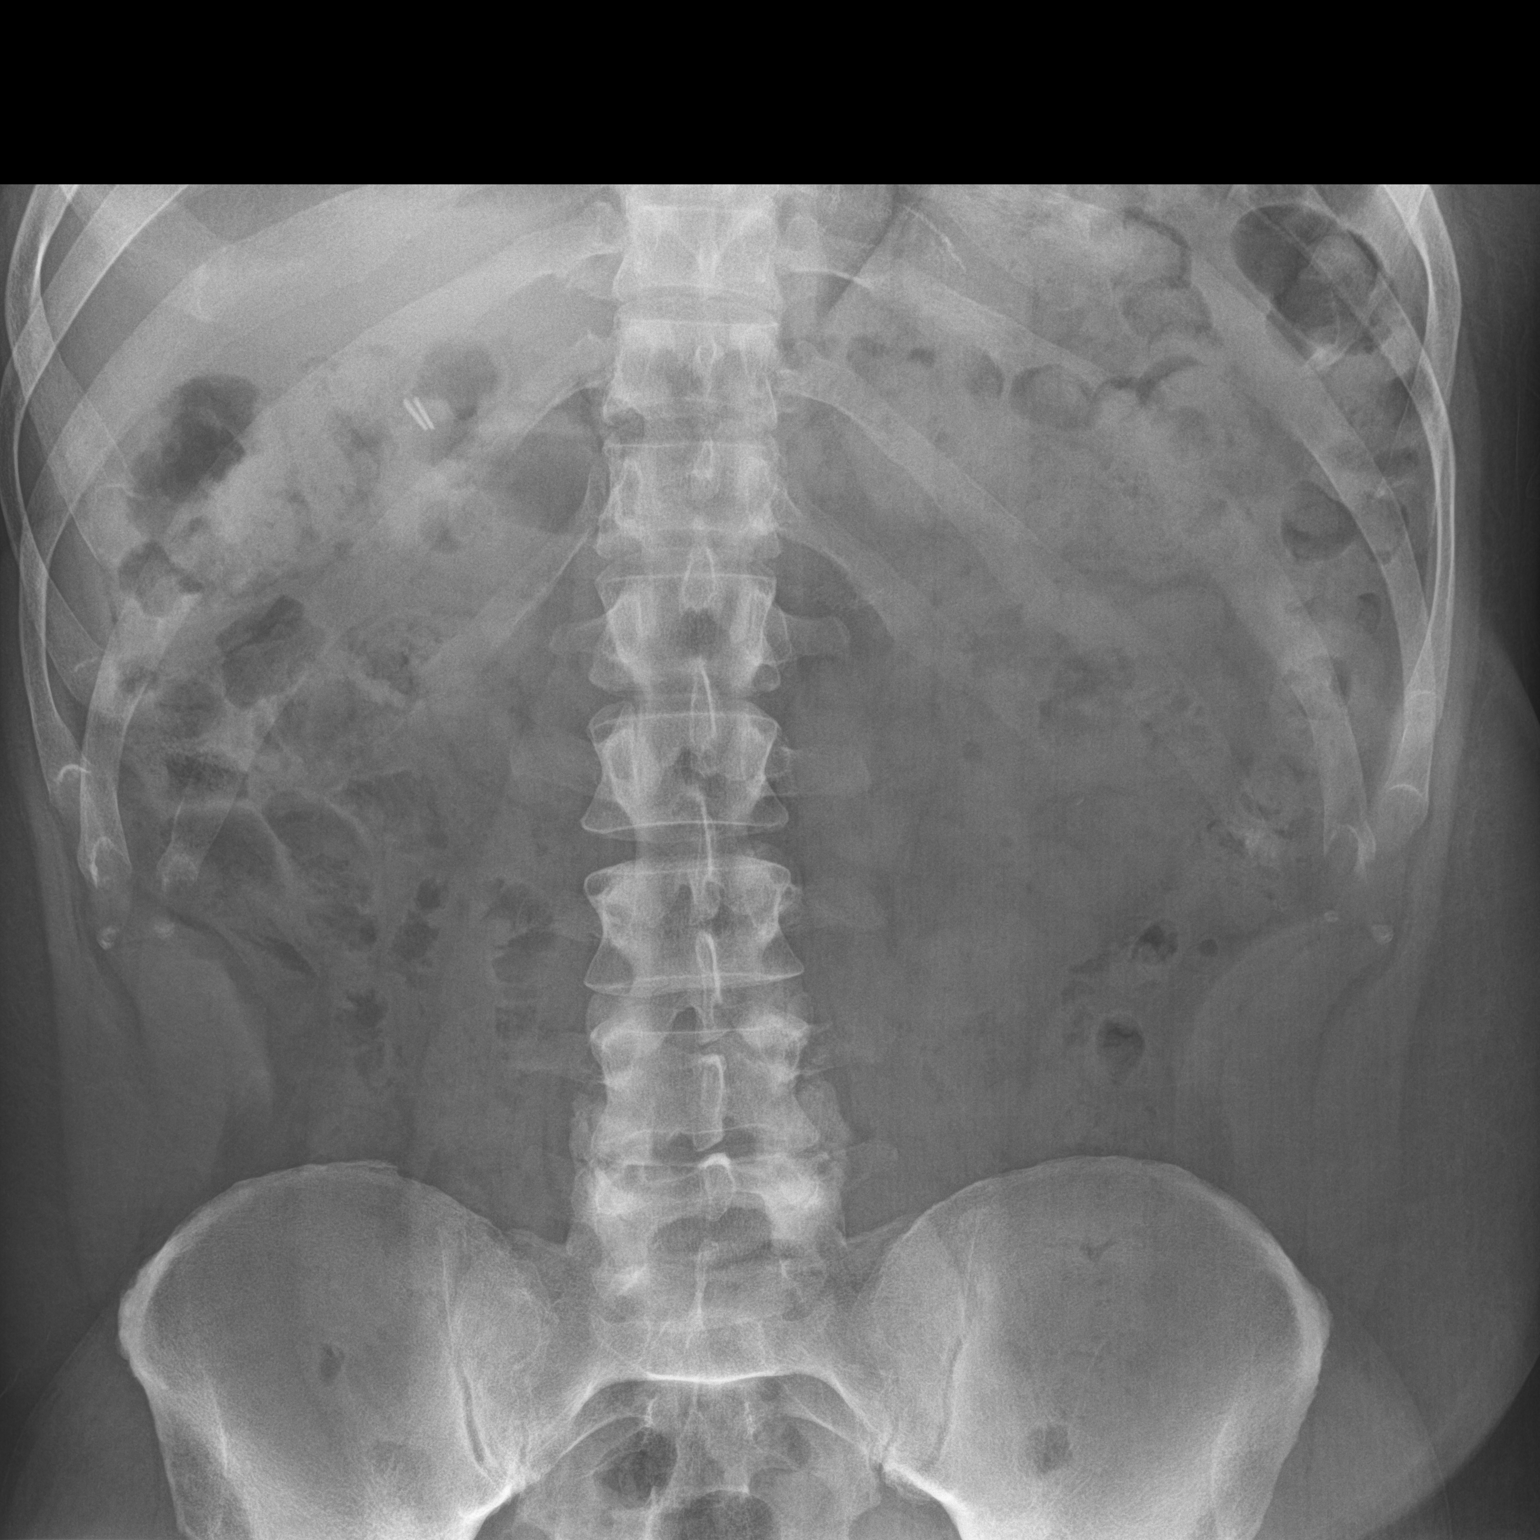

[2 of 2 positions shown; findings below may reference images not displayed]

FINDINGS: No urinary tract stones identified. Gas and stool could obscure
small stones. The bowel gas pattern is nonobstructive. The patient
is status post cholecystectomy. Mild degenerative change about the
right hip is noted. Imaged bones are otherwise unremarkable.
IMPRESSION: Negative for urinary tract stone.  No acute finding.

## 2017-07-17 ENCOUNTER — Ambulatory Visit
Admission: RE | Admit: 2017-07-17 | Discharge: 2017-07-17 | Disposition: A | Discharge status: Home / Self Care | Payer: Managed Care, Other (non HMO) | Source: Ambulatory Visit | Attending: Urology | Admitting: Urology

## 2017-07-17 ENCOUNTER — Ambulatory Visit: Payer: Managed Care, Other (non HMO) | Admitting: Urology

## 2017-07-17 ENCOUNTER — Encounter: Payer: Self-pay | Admitting: Urology

## 2017-07-17 ENCOUNTER — Other Ambulatory Visit: Payer: Self-pay | Admitting: Family Medicine

## 2017-07-17 VITALS — BP 149/77 | HR 80 | Ht 66.0 in | Wt 200.0 lb

## 2017-07-17 DIAGNOSIS — R935 Abnormal findings on diagnostic imaging of other abdominal regions, including retroperitoneum: Secondary | ICD-10-CM | POA: Diagnosis not present

## 2017-07-17 DIAGNOSIS — N200 Calculus of kidney: Secondary | ICD-10-CM | POA: Diagnosis present

## 2017-07-17 DIAGNOSIS — Z87442 Personal history of urinary calculi: Secondary | ICD-10-CM | POA: Diagnosis not present

## 2017-07-17 DIAGNOSIS — R109 Unspecified abdominal pain: Secondary | ICD-10-CM | POA: Diagnosis not present

## 2017-07-17 DIAGNOSIS — Z09 Encounter for follow-up examination after completed treatment for conditions other than malignant neoplasm: Secondary | ICD-10-CM | POA: Diagnosis not present

## 2017-07-17 LAB — URINALYSIS, COMPLETE
BILIRUBIN UA: NEGATIVE
GLUCOSE, UA: NEGATIVE
Ketones, UA: NEGATIVE
Nitrite, UA: NEGATIVE
PROTEIN UA: NEGATIVE
SPEC GRAV UA: 1.02 (ref 1.005–1.030)
UUROB: 0.2 mg/dL (ref 0.2–1.0)
pH, UA: 7 (ref 5.0–7.5)

## 2017-07-17 LAB — MICROSCOPIC EXAMINATION: Epithelial Cells (non renal): NONE SEEN /hpf (ref 0–10)

## 2017-07-17 MED ORDER — OXYCODONE-ACETAMINOPHEN 5-325 MG PO TABS: 1.0000 | ORAL_TABLET | Freq: Four times a day (QID) | ORAL | 0 refills | Status: DC | PRN

## 2017-07-17 MED ORDER — TAMSULOSIN HCL 0.4 MG PO CAPS: 0.4000 mg | ORAL_CAPSULE | Freq: Every day | ORAL | 0 refills | Status: DC

## 2017-07-17 NOTE — Progress Notes (Signed)
07/17/2017 11:21 AM   Alex MainePeter W Garcia 04/10/1971 [id number]  Referring provider: Dione Housekeeperlmedo, Alex Ernesto, MD [address] [place], [place] [id number]  Chief Complaint  Patient presents with  . Nephrolithiasis    back pain, possible stone    HPI: 46 year old male presents for evaluation of a possible ureteral calculus.  He has a history of stone disease and underwent ureteroscopic removal of left proximal and left renal calculi approximately 1 year ago.  He had no additional stones on his CT scan and a follow-up ultrasound showed no remaining calculi.  He presents today with a 3-day history of left flank pain which radiates to the left groin region and is associated with urinary frequency and urgency.  He denies fever, chills, nausea or vomiting.  Pain severity is rated mild.  His symptoms are similar to previous stone episodes but not as severe.   PMH: Past Medical History:  Diagnosis Date  . Acid reflux   . Hematuria   . Kidney stones   . Sleep apnea    HAD GASTRIC SLEEVE AND HAS COME OFF OF CPAP  . Urolithiasis     Surgical History: Past Surgical History:  Procedure Laterality Date  . CYSTOSCOPY W/ RETROGRADES Left 08/08/2016   Procedure: CYSTOSCOPY WITH RETROGRADE PYELOGRAM;  Surgeon: Alex ScotlandAshley Brandon, MD;  Location: [facility];  Service: Urology;  Laterality: Left;  . CYSTOSCOPY W/ RETROGRADES Bilateral 03/05/2016   Procedure: CYSTOSCOPY WITH RETROGRADE PYELOGRAM;  Surgeon: Alex LaserBrian James Budzyn, MD;  Location: [facility];  Service: Urology;  Laterality: Bilateral;  . CYSTOSCOPY W/ URETERAL STENT PLACEMENT Bilateral 03/29/2016   Procedure: CYSTOSCOPY WITH STENT REPLACEMENT;  Surgeon: Alex ScotlandAshley Brandon, MD;  Location: [facility];  Service: Urology;  Laterality: Bilateral;  . CYSTOSCOPY W/ URETERAL STENT REMOVAL Left 08/08/2016   Procedure: CYSTOSCOPY WITH STENT REMOVAL;  Surgeon: Alex ScotlandAshley Brandon, MD;  Location: [facility];  Service: Urology;  Laterality: Left;  . CYSTOSCOPY WITH STENT PLACEMENT  Left 07/18/2016   Procedure: CYSTOSCOPY WITH STENT PLACEMENT;  Surgeon: Alex ScotlandAshley Brandon, MD;  Location: [facility];  Service: Urology;  Laterality: Left;  . CYSTOSCOPY WITH STENT PLACEMENT Bilateral 03/05/2016   Procedure: CYSTOSCOPY WITH STENT PLACEMENT;  Surgeon: Alex LaserBrian James Budzyn, MD;  Location: [facility];  Service: Urology;  Laterality: Bilateral;  . CYSTOSCOPY/RETROGRADE/URETEROSCOPY Left 07/18/2016   Procedure: CYSTOSCOPY/RETROGRADE/URETEROSCOPY;  Surgeon: Alex ScotlandAshley Brandon, MD;  Location: [facility];  Service: Urology;  Laterality: Left;  . EXTRACORPOREAL SHOCK WAVE LITHOTRIPSY Right 03/03/2016   Procedure: EXTRACORPOREAL SHOCK WAVE LITHOTRIPSY (ESWL);  Surgeon: Alex ScotlandAshley Brandon, MD;  Location: [facility];  Service: Urology;  Laterality: Right;  . KNEE SURGERY    . LAPAROSCOPIC GASTRIC SLEEVE RESECTION    . STONE EXTRACTION WITH BASKET Left 07/18/2016   Procedure: STONE EXTRACTION WITH BASKET;  Surgeon: Alex ScotlandAshley Brandon, MD;  Location: [facility];  Service: Urology;  Laterality: Left;  . URETEROSCOPY Left 03/29/2016   Procedure: URETEROSCOPY;  Surgeon: Alex ScotlandAshley Brandon, MD;  Location: [facility];  Service: Urology;  Laterality: Left;  . URETEROSCOPY WITH HOLMIUM LASER LITHOTRIPSY Right 03/29/2016   Procedure: URETEROSCOPY WITH HOLMIUM LASER LITHOTRIPSY;  Surgeon: Alex ScotlandAshley Brandon, MD;  Location: [facility];  Service: Urology;  Laterality: Right;    Home Medications:  Allergies as of 07/17/2017      Reactions   Penicillins Itching, Rash   Has patient had a PCN reaction causing immediate rash, facial/tongue/throat swelling, SOB or lightheadedness with hypotension: No Has patient had a PCN reaction causing severe rash involving mucus membranes or skin necrosis: No  Has patient had a PCN reaction that required hospitalization No Has patient had a PCN reaction occurring within the last 10 years: No If all of the above answers are "NO", then may proceed with Cephalosporin use.      Medication List        Accurate  as of 07/17/17 11:21 AM. Always use your most recent med list.          acetaminophen 500 MG tablet Commonly known as:  TYLENOL Take 500-1,000 mg by mouth every 6 (six) hours as needed (for pain.).   ibuprofen 200 MG tablet Commonly known as:  ADVIL,MOTRIN Take 200-400 mg by mouth every 8 (eight) hours as needed (for pain).   omeprazole 20 MG capsule Commonly known as:  PRILOSEC Take 20 mg by mouth at bedtime.       Allergies:  Allergies  Allergen Reactions  . Penicillins Itching and Rash    Has patient had a PCN reaction causing immediate rash, facial/tongue/throat swelling, SOB or lightheadedness with hypotension: No Has patient had a PCN reaction causing severe rash involving mucus membranes or skin necrosis: No Has patient had a PCN reaction that required hospitalization No Has patient had a PCN reaction occurring within the last 10 years: No If all of the above answers are "NO", then may proceed with Cephalosporin use.    Family History: Family History  Problem Relation Age of Onset  . Prostate cancer Father   . Kidney disease Mother   . Kidney cancer Neg Hx   . Bladder Cancer Neg Hx     Social History:  reports that  has never smoked. he has never used smokeless tobacco. He reports that he drinks alcohol. He reports that he does not use drugs.  ROS: UROLOGY Frequent Urination?: Yes Hard to postpone urination?: No Burning/pain with urination?: No Get up at night to urinate?: Yes Leakage of urine?: No Urine stream starts and stops?: No Trouble starting stream?: No Do you have to strain to urinate?: No Blood in urine?: No Urinary tract infection?: No Sexually transmitted disease?: No Injury to kidneys or bladder?: No Painful intercourse?: No Weak stream?: No Erection problems?: No Penile pain?: No  Gastrointestinal Nausea?: No Vomiting?: No Indigestion/heartburn?: Yes Diarrhea?: No Constipation?: No  Constitutional Fever: No Night sweats?:  No Weight loss?: No Fatigue?: No  Skin Skin rash/lesions?: No Itching?: No  Eyes Blurred vision?: No Double vision?: No  Ears/Nose/Throat Sore throat?: No Sinus problems?: No  Hematologic/Lymphatic Swollen glands?: No Easy bruising?: No  Cardiovascular Leg swelling?: No Chest pain?: No  Respiratory Cough?: No Shortness of breath?: No  Endocrine Excessive thirst?: No  Musculoskeletal Back pain?: No Joint pain?: No  Neurological Headaches?: No Dizziness?: No  Psychologic Depression?: No Anxiety?: No  Physical Exam: BP (!) 149/77   Pulse 80   Ht 5\' 6"  (1.676 m)   Wt 200 lb (90.7 kg)   BMI 32.28 kg/m   Constitutional:  Alert and oriented, No acute distress. HEENT: [place] AT, moist mucus membranes.  Trachea midline, no masses. Cardiovascular: No clubbing, cyanosis, or edema. Respiratory: Normal respiratory effort, no increased work of breathing. GI: Abdomen is soft, nontender, nondistended, no abdominal masses GU: No CVA tenderness.  Skin: No rashes, bruises or suspicious lesions. Lymph: No cervical or inguinal adenopathy. Neurologic: Grossly intact, no focal deficits, moving all 4 extremities. Psychiatric: Normal mood and affect.  Laboratory Data: Lab Results  Component Value Date   WBC 7.9 03/18/2016   HGB 12.3 (L) 03/18/2016  HCT 36.9 (L) 03/18/2016   MCV 81.5 03/18/2016   PLT 291 03/18/2016    Lab Results  Component Value Date   CREATININE 0.80 03/16/2016    Lab Results  Component Value Date   HGBA1C 5.7 04/11/2013    Urinalysis Dipstick trace blood-intact Microscopy negative   Assessment & Plan: KUB reviewed and there are calcifications overlying the right and left abdominal area consistent with pill fragments.  No definite calcifications suspicious for renal or ureteral calculi are identified.  Probable small distal ureteral calculus.  Rx tamsulosin was sent to his pharmacy along with an Rx for oxycodone should he develop  increasing colic.  He was given a strainer.  If he has persistent symptoms past 1 week would recommend a stone protocol CT of the abdomen and pelvis.  He was instructed to call earlier for worsening symptoms.   - Urinalysis, Complete   Return in about 2 weeks (around 07/31/2017) for Recheck.  Riki Altes, MD  [facility] [address], [address] [place], [place] [address] [phone]

## 2017-08-04 ENCOUNTER — Ambulatory Visit: Payer: Managed Care, Other (non HMO) | Admitting: Urology

## 2018-01-05 IMAGING — US US RENAL
1 series · 14 of 25 positions shown · non-contrast
Comparison: CT of the abdomen and pelvis performed 03/04/2016

CLINICAL DATA: Acute onset of fever. Bilateral ureteral stents in
place. Initial encounter.

EXAM:
RENAL / URINARY TRACT ULTRASOUND COMPLETE

[Series 1: us renal · 0.25mm/px · 14 of 45 slices shown]
[im 1/45]
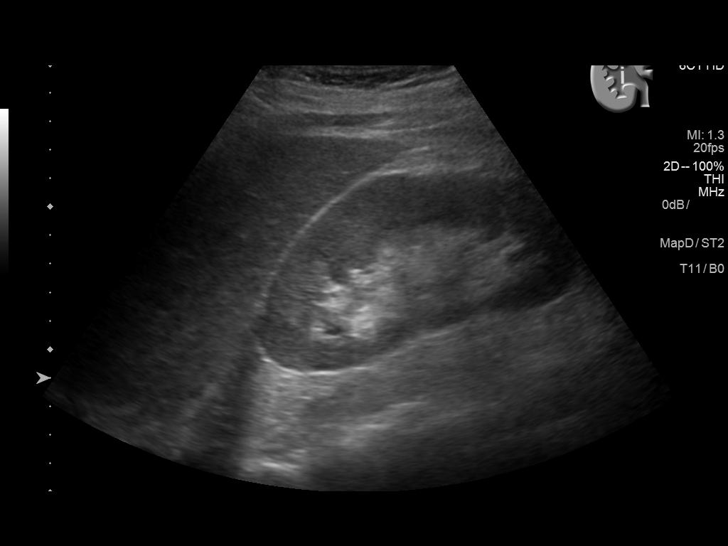
[im 4/45]
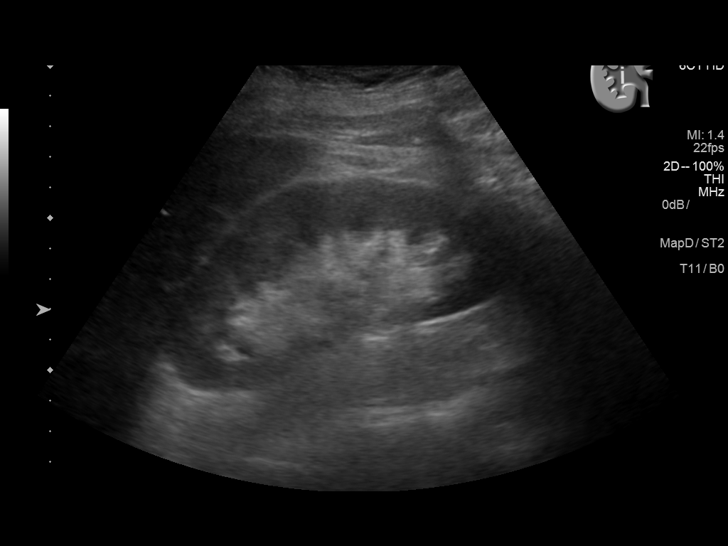
[im 8/45]
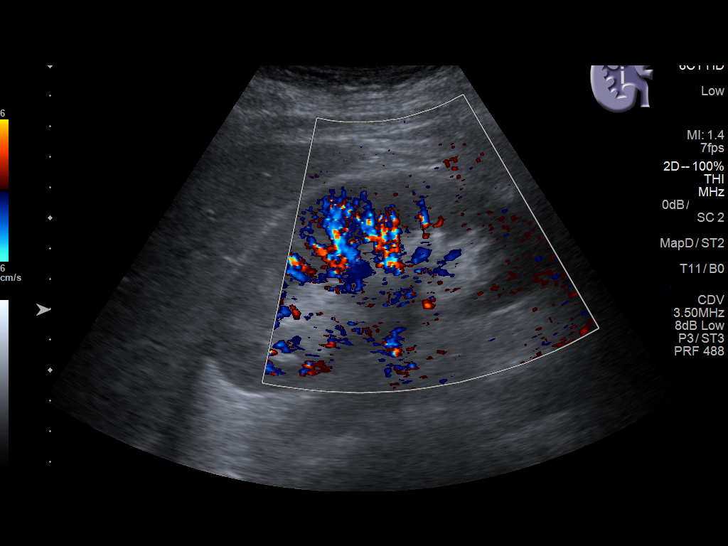
[im 12/45]
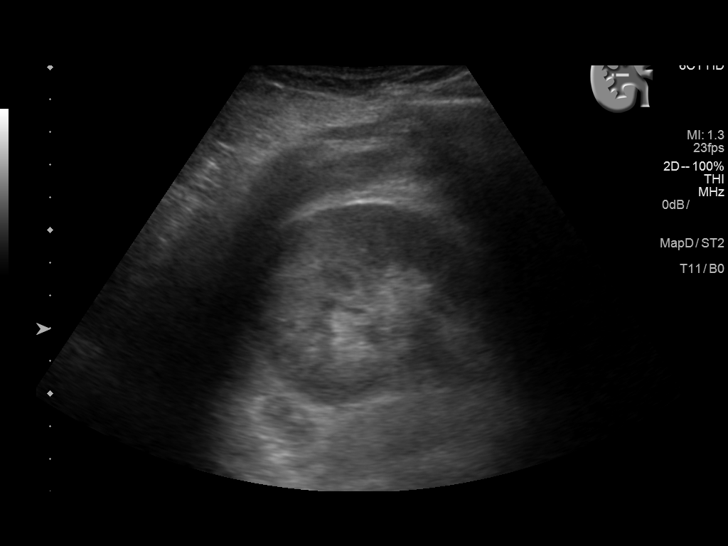
[im 15/45]
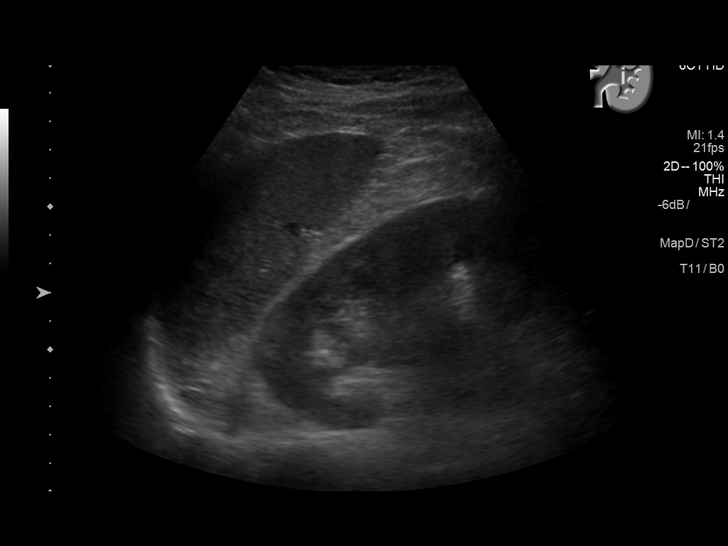
[im 17/45]
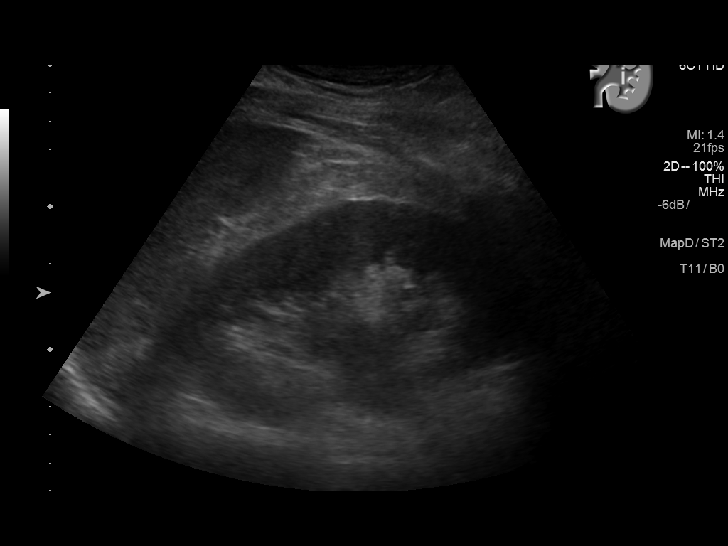
[im 21/45]
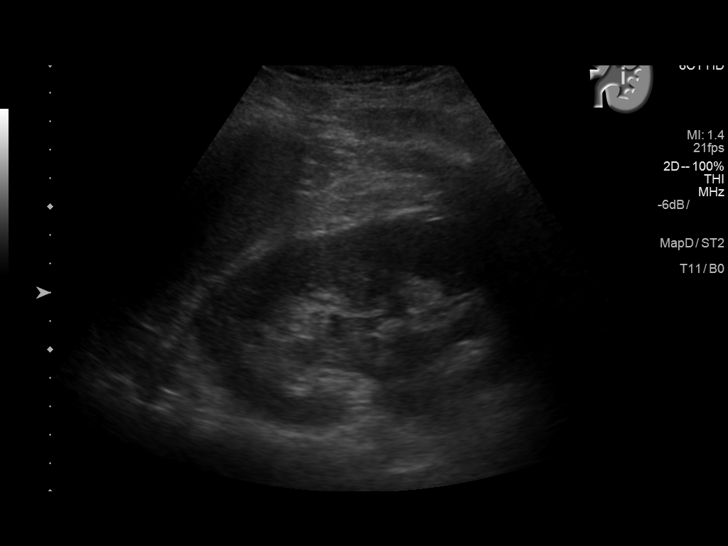
[im 24/45]
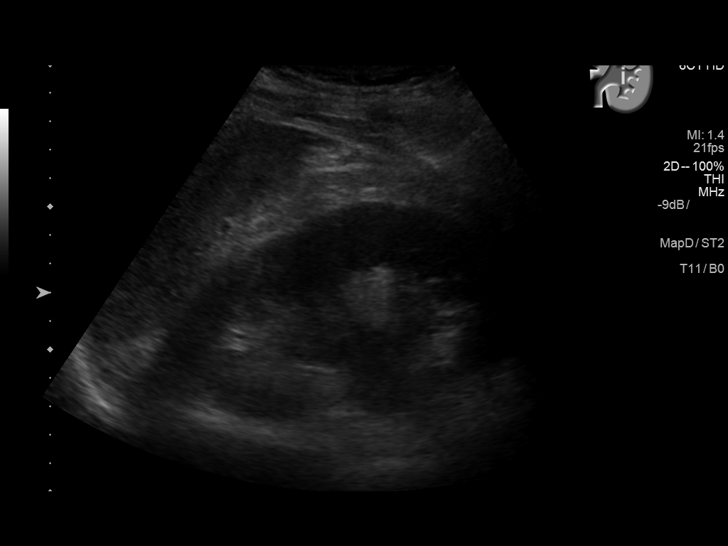
[im 28/45]
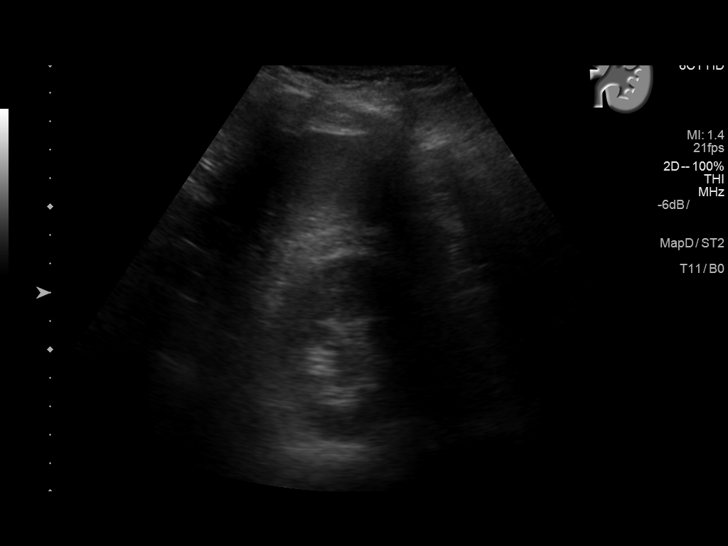
[im 30/45]
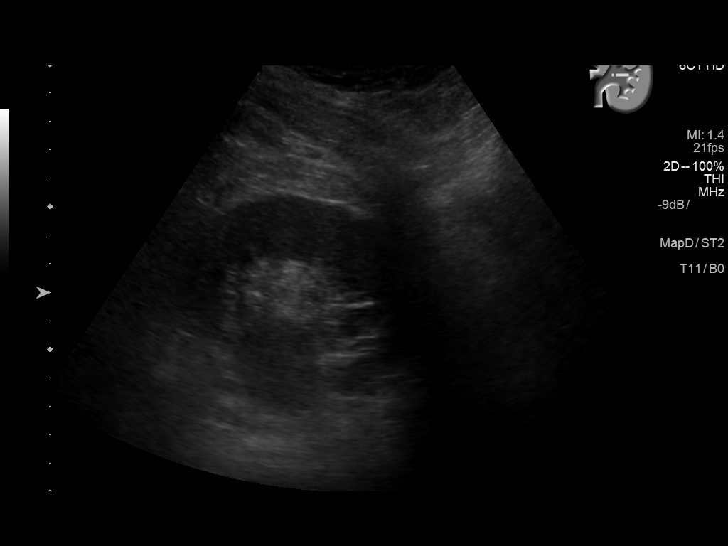
[im 34/45]
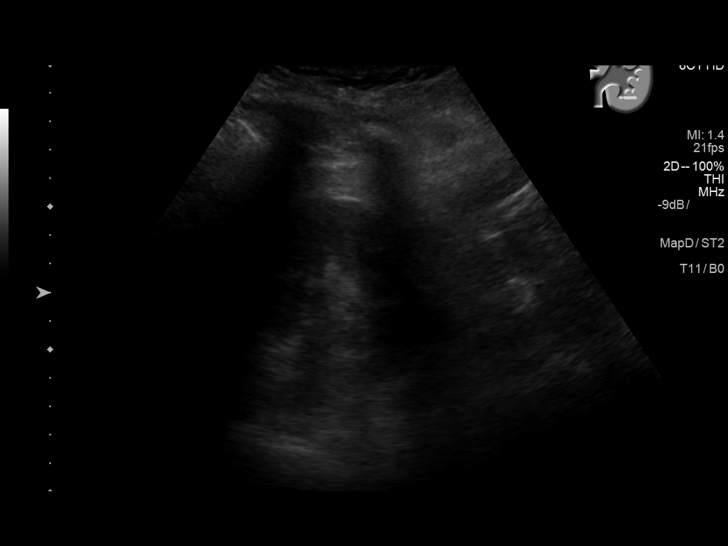
[im 37/45]
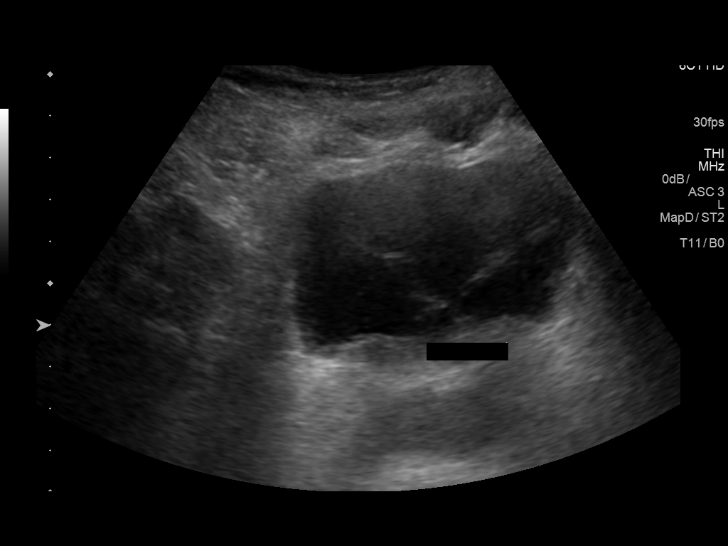
[im 41/45]
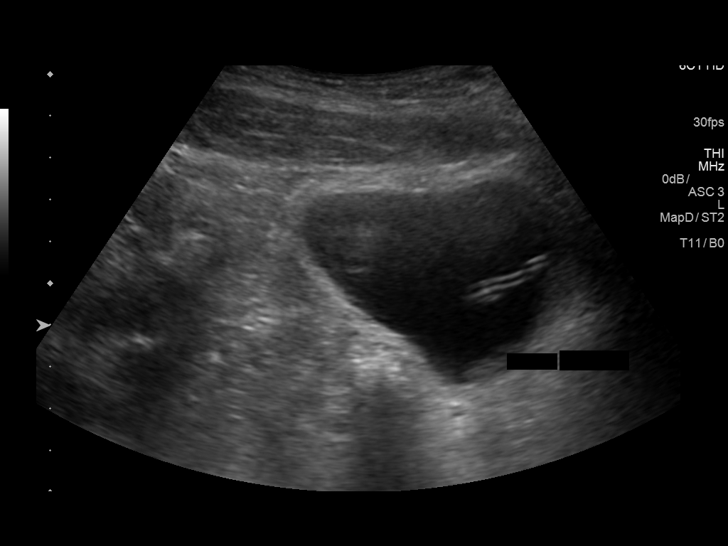
[im 45/45]
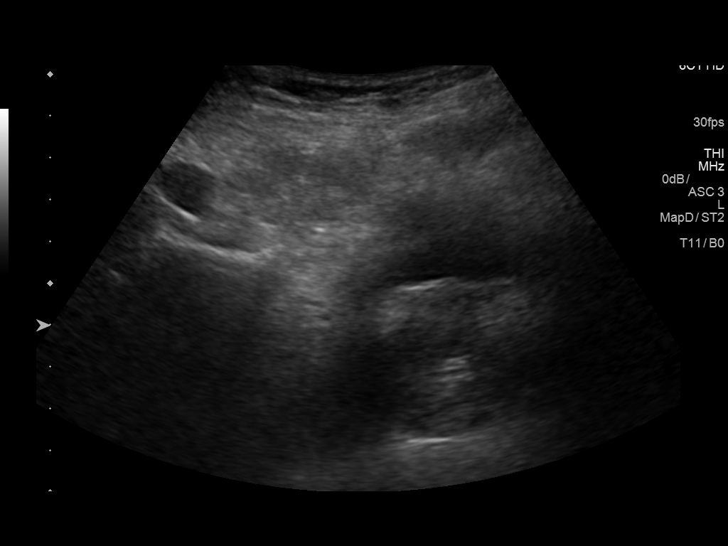

[14 of 25 positions shown; findings below may reference images not displayed]

FINDINGS: Right Kidney:

Length: 11.3 cm. Echogenicity within normal limits. No mass or
hydronephrosis visualized.

Left Kidney:

Length: 12.7 cm. Echogenicity within normal limits. No mass seen.
Mild left-sided hydronephrosis is noted.

Bladder:

Appears normal for degree of bladder distention. Bilateral ureteral
stents are seen ending at the bladder.
IMPRESSION: 1. Mild left-sided hydronephrosis noted.
2. Bilateral ureteral stents noted ending at the bladder.

## 2018-01-26 ENCOUNTER — Ambulatory Visit: Payer: Self-pay | Admitting: Emergency Medicine

## 2018-01-26 VITALS — BP 132/82 | HR 60 | Ht 66.0 in | Wt 209.0 lb

## 2018-01-26 DIAGNOSIS — Z0189 Encounter for other specified special examinations: Principal | ICD-10-CM

## 2018-01-26 DIAGNOSIS — Z008 Encounter for other general examination: Secondary | ICD-10-CM

## 2018-01-26 NOTE — Progress Notes (Signed)
Subjective. Patient here for biometric screening. No complaints today. Medications omeprazole daily. Surgeries include gastric sleeve as well as meniscus repair. Social history.Rare alcohol use. Nonsmoker. Objective. H EENT exam normal. Chest clear Heart regular rate and rhythm. Abdomen soft no tenderness. Assessment. Labs done. Advised continued weight reduction and begin exercise. Plan. We'll call with lab results. Patient has a healthy lifestyle but would benefit from continued weight reduction. He would also benefit from more exercise

## 2018-01-27 LAB — LIPID PANEL
CHOL/HDL RATIO: 2.3 ratio (ref 0.0–5.0)
Cholesterol, Total: 164 mg/dL (ref 100–199)
HDL: 72 mg/dL (ref >39)
LDL Calculated: 79 mg/dL (ref 0–99)
Triglycerides: 67 mg/dL (ref 0–149)
VLDL Cholesterol Cal: 13 mg/dL (ref 5–40)

## 2018-01-27 LAB — GLUCOSE, RANDOM: GLUCOSE: 93 mg/dL (ref 65–99)

## 2018-05-09 ENCOUNTER — Other Ambulatory Visit: Payer: Self-pay | Admitting: Physician Assistant

## 2018-05-09 DIAGNOSIS — J019 Acute sinusitis, unspecified: Secondary | ICD-10-CM

## 2018-05-18 ENCOUNTER — Ambulatory Visit
Admission: RE | Admit: 2018-05-18 | Discharge: 2018-05-18 | Disposition: A | Discharge status: Home / Self Care | Payer: Managed Care, Other (non HMO) | Source: Ambulatory Visit | Attending: Physician Assistant | Admitting: Physician Assistant

## 2018-05-18 DIAGNOSIS — J019 Acute sinusitis, unspecified: Secondary | ICD-10-CM | POA: Diagnosis present

## 2018-05-31 ENCOUNTER — Encounter: Payer: Self-pay | Admitting: *Deleted

## 2018-05-31 ENCOUNTER — Other Ambulatory Visit: Payer: Self-pay

## 2018-06-07 ENCOUNTER — Ambulatory Visit: Payer: Managed Care, Other (non HMO) | Admitting: Anesthesiology

## 2018-06-07 ENCOUNTER — Ambulatory Visit
Admission: RE | Admit: 2018-06-07 | Discharge: 2018-06-07 | Disposition: A | Discharge status: Home / Self Care | Payer: Managed Care, Other (non HMO) | Source: Ambulatory Visit | Attending: Otolaryngology | Admitting: Otolaryngology

## 2018-06-07 ENCOUNTER — Encounter
Admission: RE | Disposition: A | Discharge status: Home / Self Care | Payer: Self-pay | Source: Ambulatory Visit | Attending: Otolaryngology

## 2018-06-07 DIAGNOSIS — J343 Hypertrophy of nasal turbinates: Secondary | ICD-10-CM | POA: Diagnosis present

## 2018-06-07 DIAGNOSIS — J3489 Other specified disorders of nose and nasal sinuses: Secondary | ICD-10-CM | POA: Insufficient documentation

## 2018-06-07 DIAGNOSIS — Z79899 Other long term (current) drug therapy: Secondary | ICD-10-CM | POA: Insufficient documentation

## 2018-06-07 DIAGNOSIS — Z880 Allergy status to penicillin: Secondary | ICD-10-CM | POA: Diagnosis not present

## 2018-06-07 DIAGNOSIS — K219 Gastro-esophageal reflux disease without esophagitis: Secondary | ICD-10-CM | POA: Diagnosis not present

## 2018-06-07 DIAGNOSIS — J342 Deviated nasal septum: Secondary | ICD-10-CM | POA: Diagnosis present

## 2018-06-07 DIAGNOSIS — Z8249 Family history of ischemic heart disease and other diseases of the circulatory system: Secondary | ICD-10-CM | POA: Insufficient documentation

## 2018-06-07 HISTORY — PX: SEPTOPLASTY: SHX2393

## 2018-06-07 HISTORY — PX: TURBINATE REDUCTION: SHX6157

## 2018-06-07 SURGERY — SEPTOPLASTY, NOSE
Anesthesia: General | Site: Nose | Laterality: Bilateral

## 2018-06-07 MED ORDER — GLYCOPYRROLATE 0.2 MG/ML IJ SOLN
INTRAMUSCULAR | Status: DC | PRN
  Administered 2018-06-07: 0.1 mg via INTRAVENOUS

## 2018-06-07 MED ORDER — DEXMEDETOMIDINE HCL 200 MCG/2ML IV SOLN
INTRAVENOUS | Status: DC | PRN
  Administered 2018-06-07: 4 ug via INTRAVENOUS

## 2018-06-07 MED ORDER — PHENYLEPHRINE HCL 0.5 % NA SOLN
NASAL | Status: DC | PRN
  Administered 2018-06-07: 15 mL via TOPICAL

## 2018-06-07 MED ORDER — SCOPOLAMINE 1 MG/3DAYS TD PT72
1.0000 | MEDICATED_PATCH | Freq: Once | TRANSDERMAL | Status: DC
  Administered 2018-06-07: 1.5 mg via TRANSDERMAL

## 2018-06-07 MED ORDER — PROPOFOL 10 MG/ML IV BOLUS
INTRAVENOUS | Status: DC | PRN
  Administered 2018-06-07: 180 mg via INTRAVENOUS

## 2018-06-07 MED ORDER — OXYCODONE HCL 5 MG PO TABS
5.0000 mg | ORAL_TABLET | Freq: Once | ORAL | Status: AC | PRN
  Administered 2018-06-07: 5 mg via ORAL

## 2018-06-07 MED ORDER — DEXAMETHASONE SODIUM PHOSPHATE 4 MG/ML IJ SOLN
INTRAMUSCULAR | Status: DC | PRN
  Administered 2018-06-07: 4 mg via INTRAVENOUS

## 2018-06-07 MED ORDER — HYDROCODONE-ACETAMINOPHEN 5-325 MG PO TABS: 2.0000 | ORAL_TABLET | Freq: Four times a day (QID) | ORAL | 0 refills | Status: DC | PRN

## 2018-06-07 MED ORDER — ONDANSETRON HCL 4 MG/2ML IJ SOLN: 4.0000 mg | Freq: Once | INTRAMUSCULAR | Status: DC | PRN

## 2018-06-07 MED ORDER — EPHEDRINE SULFATE 50 MG/ML IJ SOLN
INTRAMUSCULAR | Status: DC | PRN
  Administered 2018-06-07: 10 mg via INTRAVENOUS

## 2018-06-07 MED ORDER — ONDANSETRON HCL 4 MG/2ML IJ SOLN
INTRAMUSCULAR | Status: DC | PRN
  Administered 2018-06-07: 4 mg via INTRAVENOUS

## 2018-06-07 MED ORDER — OXYMETAZOLINE HCL 0.05 % NA SOLN
2.0000 | Freq: Once | NASAL | Status: AC
  Administered 2018-06-07: 2 via NASAL

## 2018-06-07 MED ORDER — CEPHALEXIN 500 MG PO CAPS: 500.0000 mg | ORAL_CAPSULE | Freq: Two times a day (BID) | ORAL | 0 refills | Status: DC

## 2018-06-07 MED ORDER — FENTANYL CITRATE (PF) 100 MCG/2ML IJ SOLN: 25.0000 ug | INTRAMUSCULAR | Status: DC | PRN

## 2018-06-07 MED ORDER — LACTATED RINGERS IV SOLN
INTRAVENOUS | Status: DC
  Administered 2018-06-07: 08:00:00 via INTRAVENOUS

## 2018-06-07 MED ORDER — LIDOCAINE-EPINEPHRINE 1 %-1:100000 IJ SOLN
INTRAMUSCULAR | Status: DC | PRN
  Administered 2018-06-07: 6 mL

## 2018-06-07 MED ORDER — FENTANYL CITRATE (PF) 100 MCG/2ML IJ SOLN
INTRAMUSCULAR | Status: DC | PRN
  Administered 2018-06-07 (×2): 50 ug via INTRAVENOUS

## 2018-06-07 MED ORDER — MIDAZOLAM HCL 5 MG/5ML IJ SOLN
INTRAMUSCULAR | Status: DC | PRN
  Administered 2018-06-07: 2 mg via INTRAVENOUS

## 2018-06-07 MED ORDER — LIDOCAINE HCL (CARDIAC) PF 100 MG/5ML IV SOSY
PREFILLED_SYRINGE | INTRAVENOUS | Status: DC | PRN
  Administered 2018-06-07: 40 mg via INTRAVENOUS

## 2018-06-07 MED ORDER — ACETAMINOPHEN 10 MG/ML IV SOLN
1000.0000 mg | Freq: Once | INTRAVENOUS | Status: AC
  Administered 2018-06-07: 1000 mg via INTRAVENOUS

## 2018-06-07 MED ORDER — DEXTROSE 5 % IV SOLN: 600.0000 mg | Freq: Once | INTRAVENOUS | Status: DC

## 2018-06-07 MED ORDER — SUCCINYLCHOLINE CHLORIDE 20 MG/ML IJ SOLN
INTRAMUSCULAR | Status: DC | PRN
  Administered 2018-06-07: 100 mg via INTRAVENOUS

## 2018-06-07 MED ORDER — PREDNISONE 10 MG PO TABS: ORAL_TABLET | ORAL | 0 refills | Status: DC

## 2018-06-07 MED ORDER — OXYCODONE HCL 5 MG/5ML PO SOLN: 5.0000 mg | Freq: Once | ORAL | Status: AC | PRN

## 2018-06-07 SURGICAL SUPPLY — 23 items
CANISTER SUCT 1200ML W/VALVE (MISCELLANEOUS) ×2 IMPLANT
COAGULATOR SUCT 8FR VV (MISCELLANEOUS) ×2 IMPLANT
DRAPE HEAD BAR (DRAPES) ×2 IMPLANT
ELECT REM PT RETURN 9FT ADLT (ELECTROSURGICAL) ×2
ELECTRODE REM PT RTRN 9FT ADLT (ELECTROSURGICAL) ×1 IMPLANT
GLOVE PI ULTRA LF STRL 7.5 (GLOVE) ×2 IMPLANT
GLOVE PI ULTRA NON LATEX 7.5 (GLOVE) ×2
KIT TURNOVER KIT A (KITS) ×2 IMPLANT
NEEDLE ANESTHESIA  27G X 3.5 (NEEDLE) ×1
NEEDLE ANESTHESIA 27G X 3.5 (NEEDLE) ×1 IMPLANT
NEEDLE HYPO 27GX1-1/4 (NEEDLE) ×2 IMPLANT
PACK ENT CUSTOM (PACKS) ×2 IMPLANT
PATTIES SURGICAL .5 X3 (DISPOSABLE) ×2 IMPLANT
SPLINT NASAL SEPTAL BLV .50 ST (MISCELLANEOUS) ×2 IMPLANT
STRAP BODY AND KNEE 60X3 (MISCELLANEOUS) ×2 IMPLANT
SUT CHROMIC 3-0 (SUTURE)
SUT CHROMIC 3-0 KS 27XMFL CR (SUTURE)
SUT ETHILON 3-0 KS 30 BLK (SUTURE) ×2 IMPLANT
SUT PLAIN GUT 4-0 (SUTURE) ×2 IMPLANT
SUTURE CHRMC 3-0 KS 27XMFL CR (SUTURE) IMPLANT
SYR 3ML LL SCALE MARK (SYRINGE) ×2 IMPLANT
TOWEL OR 17X26 4PK STRL BLUE (TOWEL DISPOSABLE) ×2 IMPLANT
WATER STERILE IRR 250ML POUR (IV SOLUTION) ×2 IMPLANT

## 2018-06-07 NOTE — Op Note (Signed)
06/07/2018  10:19 AM  [id number]   Pre-Op Dx:  Deviated Nasal Septum, Hypertrophic Inferior Turbinates  Post-op Dx: Same  Proc: Nasal Septoplasty, Bilateral Partial Reduction Inferior Turbinates   Surg:  [name]  Anes:  GOT  EBL: 40 mL  Comp: None  Findings: Inferior quadrangular cartilage was overhanging maxillary crest and the left side.  The crest was buckled to left as well.  The vomer had a very large spur pushing into the middle turbinate.  Procedure: With the patient in a comfortable supine position,  general orotracheal anesthesia was induced without difficulty.     The patient received preoperative Afrin spray for topical decongestion and vasoconstriction.  Intravenous prophylactic antibiotics were administered.  At an appropriate level, the patient was placed in a semi-sitting position.  Nasal vibrissae were trimmed.   1% Xylocaine with 1:100,000 epinephrine, 6 cc's, was infiltrated into the anterior floor of the nose, into the nasal spine region, into the membranous columella, and finally into the submucoperichondrial plane of the septum on both sides.  Several minutes were allowed for this to take effect.  Cottoniod pledgetts soaked in Afrin and 4% Xylocaine were placed into both nasal cavities and left while the patient was prepped and draped in the standard fashion.  The materials were removed from the nose and observed to be intact and correct in number.  The nose was inspected with a headlight and zero degree scope with the findings as described above.  A left Killian incision was sharply executed and carried down to the quadrangular cartilage. The mucoperichondrium was elelvated along the quadrangular plate back to the bony-cartilaginous junction. The mucoperiostium was then elevated along the ethmoid plate and the vomer. The boney-catilaginous junction was then split with a freer elevator and the mucoperiosteum was elevated on the opposite side. The  mucoperiosteum was then elevated along the maxillary crest as needed to expose the crooked bone of the crest.  Boney spurs of the vomer and maxillary crest were removed with Lenoria Chime forceps.  The cartilaginous plate was trimmed along its posterior and inferior borders of about 2 mm of cartilage to free it up inferiorly. Some of the deviated ethmoid plate was then fractured and removed with Takahashi forceps to free up the posterior border of the quadrangular plate and allow it to swing back to the midline. The mucosal flaps were placed back into their anatomic position to allow visualization of the airways. The septum now sat in the midline with an improved airway.  A 3-0 Chromic suture on a Keith needle in used to anchor the inferior septum at the nasal spine with a through and through suture. The mucosal flaps are then sutured together using a through and through whip stitch of 4-0 Plain Gut with a mini-Keith needle. This was used to close the [place] incision as well.   The inferior turbinates were then inspected. An incision was created along the inferior aspect of the left inferior turbinate with removal of some of the inferior soft tissue and bone. Electrocautery was used to control bleeding in the area. The remaining turbinate was then outfractured to open up the airway further. There was no significant bleeding noted. The right turbinate was then trimmed and outfractured in a similar fashion.  The airways were then visualized and showed open passageways on both sides that were significantly improved compared to before surgery. There was no signifcant bleeding. Nasal splints were applied to both sides of the septum using Xomed 0.28mm regular sized splints that  were trimmed, and then held in position with a 3-0 Nylon through and through suture.  The patient was turned back over to anesthesia, and awakened, extubated, and taken to the PACU in satisfactory condition.  Dispo:   PACU to home  Plan:  Ice, elevation, narcotic analgesia, steroid taper, and prophylactic antibiotics for the duration of indwelling nasal foreign bodies.  We will reevaluate the patient in the office in 6 days and remove the septal splints.  Return to work in 10 days, strenuous activities in two weeks.   [name] 06/07/2018 10:19 AM

## 2018-06-07 NOTE — Anesthesia Procedure Notes (Signed)
Procedure Name: Intubation Date/Time: 06/07/2018 9:18 AM Performed by: Cameron Ali, CRNA Pre-anesthesia Checklist: Patient identified, Emergency Drugs available, Suction available, Patient being monitored and Timeout performed Patient Re-evaluated:Patient Re-evaluated prior to induction Oxygen Delivery Method: Circle system utilized Preoxygenation: Pre-oxygenation with 100% oxygen Induction Type: IV induction Ventilation: Mask ventilation without difficulty Laryngoscope Size: Mac and 3 Grade View: Grade I Tube type: Oral Rae Tube size: 7.5 mm Number of attempts: 1 Placement Confirmation: ETT inserted through vocal cords under direct vision,  positive ETCO2 and breath sounds checked- equal and bilateral Tube secured with: Tape Dental Injury: Teeth and Oropharynx as per pre-operative assessment

## 2018-06-07 NOTE — Anesthesia Preprocedure Evaluation (Signed)
Anesthesia Evaluation  Patient identified by MRN, date of birth, ID band Patient awake    Reviewed: Allergy & Precautions, H&P , NPO status , Patient's Chart, lab work & pertinent test results  Airway Mallampati: II  TM Distance: >3 FB Neck ROM: full    Dental no notable dental hx.    Pulmonary sleep apnea ,    Pulmonary exam normal breath sounds clear to auscultation       Cardiovascular Normal cardiovascular exam Rhythm:regular Rate:Normal     Neuro/Psych    GI/Hepatic GERD  ,  Endo/Other    Renal/GU      Musculoskeletal   Abdominal   Peds  Hematology   Anesthesia Other Findings   Reproductive/Obstetrics                             Anesthesia Physical Anesthesia Plan  ASA: II  Anesthesia Plan: General ETT   Post-op Pain Management:    Induction:   PONV Risk Score and Plan: 2 and Ondansetron, Dexamethasone, Scopolamine patch - Pre-op and Treatment may vary due to age or medical condition  Airway Management Planned:   Additional Equipment:   Intra-op Plan:   Post-operative Plan:   Informed Consent: I have reviewed the patients History and Physical, chart, labs and discussed the procedure including the risks, benefits and alternatives for the proposed anesthesia with the patient or authorized representative who has indicated his/her understanding and acceptance.     Plan Discussed with: CRNA  Anesthesia Plan Comments:         Anesthesia Quick Evaluation

## 2018-06-07 NOTE — H&P (Signed)
H&P has been reviewed and patient reevaluated, no changes necessary. To be downloaded later.  

## 2018-06-07 NOTE — Anesthesia Postprocedure Evaluation (Signed)
Anesthesia Post Note  Patient: [name]  Procedure(s) Performed: SEPTOPLASTY (Bilateral Nose) INFERIOR TURBINATE REDUCTION (Bilateral Nose)  Patient location during evaluation: PACU Anesthesia Type: General Level of consciousness: awake and alert and oriented Pain management: satisfactory to patient Vital Signs Assessment: post-procedure vital signs reviewed and stable Respiratory status: spontaneous breathing, nonlabored ventilation and respiratory function stable Cardiovascular status: blood pressure returned to baseline and stable Postop Assessment: Adequate PO intake and No signs of nausea or vomiting Anesthetic complications: no    Cherly Beach

## 2018-06-07 NOTE — Discharge Instructions (Signed)
[facility] °[facility] °ENDOSCOPIC SINUS SURGERY °[facility] ° °What is Functional Endoscopic Sinus Surgery? ° The Surgery involves making the natural openings of the sinuses larger by removing the bony partitions that separate the sinuses from the nasal cavity.  The natural sinus lining is preserved as much as possible to allow the sinuses to resume normal function after the surgery.  In some patients nasal polyps (excessively swollen lining of the sinuses) may be removed to relieve obstruction of the sinus openings.  The surgery is performed through the nose using lighted scopes, which eliminates the need for incisions on the face.  A septoplasty is a different procedure which is sometimes performed with sinus surgery.  It involves straightening the boy partition that separates the two sides of your nose.  A crooked or deviated septum may need repair if is obstructing the sinuses or nasal airflow.  Turbinate reduction is also often performed during sinus surgery.  The turbinates are bony proturberances from the side walls of the nose which swell and can obstruct the nose in patients with sinus and allergy problems.  Their size can be surgically reduced to help relieve nasal obstruction. ° °What Can Sinus Surgery Do For Me? ° Sinus surgery can reduce the frequency of sinus infections requiring antibiotic treatment.  This can provide improvement in nasal congestion, post-nasal drainage, facial pressure and nasal obstruction.  Surgery will NOT prevent you from ever having an infection again, so it usually only for patients who get infections 4 or more times yearly requiring antibiotics, or for infections that do not clear with antibiotics.  It will not cure nasal allergies, so patients with allergies may still require medication to treat their allergies after surgery. Surgery may improve headaches related to sinusitis, however, some people will continue to  require medication to control sinus headaches related to allergies.  Surgery will do nothing for other forms of headache (migraine, tension or cluster). ° °What Are the Risks of Endoscopic Sinus Surgery? ° Current techniques allow surgery to be performed safely with little risk, however, there are rare complications that patients should be aware of.  Because the sinuses are located around the eyes, there is risk of eye injury, including blindness, though again, this would be quite rare. This is usually a result of bleeding behind the eye during surgery, which puts the vision oat risk, though there are treatments to protect the vision and prevent permanent disrupted by surgery causing a leak of the spinal fluid that surrounds the brain.  More serious complications would include bleeding inside the brain cavity or damage to the brain.  Again, all of these complications are uncommon, and spinal fluid leaks can be safely managed surgically if they occur.  The most common complication of sinus surgery is bleeding from the nose, which may require packing or cauterization of the nose.  Continued sinus have polyps may experience recurrence of the polyps requiring revision surgery.  Alterations of sense of smell or injury to the tear ducts are also rare complications.  ° °What is the Surgery Like, and what is the Recovery? ° The Surgery usually takes a couple of hours to perform, and is usually performed under a general anesthetic (completely asleep).  Patients are usually discharged home after a couple of hours.  Sometimes during surgery it is necessary to pack the nose to control bleeding, and the packing is left in place for 24 - 48 hours, and removed by your surgeon.    If a septoplasty was performed during the procedure, there is often a splint placed which must be removed after 5-7 days.   °Discomfort: Pain is usually mild to moderate, and can be controlled by prescription pain medication or acetaminophen (Tylenol).   Aspirin, Ibuprofen (Advil, Motrin), or Naprosyn (Aleve) should be avoided, as they can cause increased bleeding.  Most patients feel sinus pressure like they have a bad head cold for several days.  Sleeping with your head elevated can help reduce swelling and facial pressure, as can ice packs over the face.  A humidifier may be helpful to keep the mucous and blood from drying in the nose.  ° °Diet: There are no specific diet restrictions, however, you should generally start with clear liquids and a light diet of bland foods because the anesthetic can cause some nausea.  Advance your diet depending on how your stomach feels.  Taking your pain medication with food will often help reduce stomach upset which pain medications can cause. ° °Nasal Saline Irrigation: It is important to remove blood clots and dried mucous from the nose as it is healing.  This is done by having you irrigate the nose at least 3 - 4 times daily with a salt water solution.  We recommend using NeilMed Sinus Rinse (available at the drug store).  Fill the squeeze bottle with the solution, bend over a sink, and insert the tip of the squeeze bottle into the nose ½ of an inch.  Point the tip of the squeeze bottle towards the inside corner of the eye on the same side your irrigating.  Squeeze the bottle and gently irrigate the nose.  If you bend forward as you do this, most of the fluid will flow back out of the nose, instead of down your throat.   The solution should be warm, near body temperature, when you irrigate.   Each time you irrigate, you should use a full squeeze bottle.  ° °Note that if you are instructed to use Nasal Steroid Sprays at any time after your surgery, irrigate with saline BEFORE using the steroid spray, so you do not wash it all out of the nose. °Another product, Nasal Saline Gel (such as AYR Nasal Saline Gel) can be applied in each nostril 3 - 4 times daily to moisture the nose and reduce scabbing or crusting. ° °Bleeding:   Bloody drainage from the nose can be expected for several days, and patients are instructed to irrigate their nose frequently with salt water to help remove mucous and blood clots.  The drainage may be dark red or brown, though some fresh blood may be seen intermittently, especially after irrigation.  Do not blow you nose, as bleeding may occur. If you must sneeze, keep your mouth open to allow air to escape through your mouth. ° °If heavy bleeding occurs: Irrigate the nose with saline to rinse out clots, then spray the nose 3 - 4 times with Afrin Nasal Decongestant Spray.  The spray will constrict the blood vessels to slow bleeding.  Pinch the lower half of your nose shut to apply pressure, and lay down with your head elevated.  Ice packs over the nose may help as well. If bleeding persists despite these measures, you should notify your doctor.  Do not use the Afrin routinely to control nasal congestion after surgery, as it can result in worsening congestion and may affect healing.  ° ° ° °Activity: Return to work varies among patients. Most patients will be   out of work at least 5 - 7 days to recover.  Patient may return to work after they are off of narcotic pain medication, and feeling well enough to perform the functions of their job.  Patients must avoid heavy lifting (over 10 pounds) or strenuous physical for 2 weeks after surgery, so your employer may need to assign you to light duty, or keep you out of work longer if light duty is not possible.  NOTE: you should not drive, operate dangerous machinery, do any mentally demanding tasks or make any important legal or financial decisions while on narcotic pain medication and recovering from the general anesthetic.  °  °Call Your Doctor Immediately if You Have Any of the Following: °1. Bleeding that you cannot control with the above measures °2. Loss of vision, double vision, bulging of the eye or black eyes. °3. Fever over 101 degrees °4. Neck stiffness with  severe headache, fever, nausea and change in mental state. °You are always encourage to call anytime with concerns, however, please call with requests for pain medication refills during office hours. ° °Office Endoscopy: During follow-up visits your doctor will remove any packing or splints that may have been placed and evaluate and clean your sinuses endoscopically.  Topical anesthetic will be used to make this as comfortable as possible, though you may want to take your pain medication prior to the visit.  How often this will need to be done varies from patient to patient.  After complete recovery from the surgery, you may need follow-up endoscopy from time to time, particularly if there is concern of recurrent infection or nasal polyps. ° ° ° °Scopolamine skin patches °REMOVE PATCH IN 72 HOURS AND WASH HANDS IMMEDIATELY °What is this medicine? °SCOPOLAMINE (skoe POL a meen) is used to prevent nausea and vomiting caused by motion sickness, anesthesia and surgery. °This medicine may be used for other purposes; ask your health care provider or pharmacist if you have questions. °COMMON BRAND NAME(S): Transderm Scop °What should I tell my health care provider before I take this medicine? °They need to know if you have any of these conditions: °-glaucoma °-kidney or liver disease °-an unusual or allergic reaction (especially skin allergy) to scopolamine, atropine, other medicines, foods, dyes, or preservatives °-pregnant or trying to get pregnant °-breast-feeding °How should I use this medicine? °This medicine is for external use only. Follow the directions on the prescription label. One patch contains enough medicine to prevent motion sickness for up to 3 days. Apply the patch at least 4 hours before you need it and only wear one disc at a time. Choose an area behind the ear, that is clean, dry, hairless and free from any cuts or irritation. Wipe the area with a clean dry tissue. Peel off the plastic backing of the  skin patch, trying not to touch the adhesive side with your hands. Do not cut the patches. Firmly apply to the area you have chosen, with the metallic side of the patch to the skin and the tan-colored side showing. Once firmly in place, wash your hands well with soap and water. Remove the disc after 3 days, or sooner if you no longer need it. After removing the patch, wash your hands and the area behind your ear thoroughly with soap and water. The patch will still contain some medicine after use. To avoid accidental contact or ingestion by children or pets, fold the used patch in half with the sticky side together and throw away in   the trash out of the reach of children and pets. If you need to use a second patch after you remove the first, place it behind the other ear. °Talk to your pediatrician regarding the use of this medicine in children. Special care may be needed. °Overdosage: If you think you have taken too much of this medicine contact a poison control center or emergency room at once. °NOTE: This medicine is only for you. Do not share this medicine with others. °What if I miss a dose? °Make sure you apply the patch at least 4 hours before you need it. You can apply it the night before traveling. °What may interact with this medicine? °-benztropine °-bethanechol °-medicines for anxiety or sleeping problems like diazepam or temazepam °-medicines for hay fever and other allergies °-medicines for mental depression °-muscle relaxants °This list may not describe all possible interactions. Give your health care provider a list of all the medicines, herbs, non-prescription drugs, or dietary supplements you use. Also tell them if you smoke, drink alcohol, or use illegal drugs. Some items may interact with your medicine. °What should I watch for while using this medicine? °Keep the patch dry, if possible, to prevent it from falling off. Limited contact with water, however, as in bathing or swimming, will not affect  the system. If the patch falls off, throw it away and put a new one behind the other ear. °You may get drowsy or dizzy. Do not drive, use machinery, or do anything that needs mental alertness until you know how this medicine affects you. Do not stand or sit up quickly, especially if you are an older patient. This reduces the risk of dizzy or fainting spells. Alcohol may interfere with the effect of this medicine. Avoid alcoholic drinks. °Your mouth may get dry. Chewing sugarless gum or sucking hard candy, and drinking plenty of water may help. Contact your doctor if the problem does not go away or is severe. °This medicine may cause dry eyes and blurred vision. If you wear contact lenses you may feel some discomfort. Lubricating drops may help. See your eye doctor if the problem does not go away or is severe. °If you are going to have a magnetic resonance imaging (MRI) procedure, tell your MRI technician if you have this patch on your body. It must be removed before a MRI. °What side effects may I notice from receiving this medicine? °Side effects that you should report to your doctor or health care professional as soon as possible: °-agitation, nervousness, confusion °-blurred vision and other eye problems °-dizziness, drowsiness °-eye pain or redness in the whites of the eye °-hallucinations °-pain or difficulty passing urine °-skin rash, itching °-vomiting °Side effects that usually do not require medical attention (report to your doctor or health care professional if they continue or are bothersome): °-headache °-nausea °This list may not describe all possible side effects. Call your doctor for medical advice about side effects. You may report side effects to FDA at 1-800-FDA-1088. °Where should I keep my medicine? °Keep out of the reach of children. °Store at room temperature between 20 and 25 degrees C (68 and 77 degrees F). Throw away any unused medicine after the expiration date. When you remove a patch,  fold it and throw it in the trash as described above. °NOTE: This sheet is a summary. It may not cover all possible information. If you have questions about this medicine, talk to your doctor, pharmacist, or health care provider. °© 2018 Elsevier/Gold Standard (2011-12-15   13:31:48) ° °General Anesthesia, Adult, Care After °These instructions provide you with information about caring for yourself after your procedure. Your health care provider may also give you more specific instructions. Your treatment has been planned according to current medical practices, but problems sometimes occur. Call your health care provider if you have any problems or questions after your procedure. °What can I expect after the procedure? °After the procedure, it is common to have: °· Vomiting. °· A sore throat. °· Mental slowness. ° °It is common to feel: °· Nauseous. °· Cold or shivery. °· Sleepy. °· Tired. °· Sore or achy, even in parts of your body where you did not have surgery. ° °Follow these instructions at home: °For at least 24 hours after the procedure: °· Do not: °? Participate in activities where you could fall or become injured. °? Drive. °? Use heavy machinery. °? Drink alcohol. °? Take sleeping pills or medicines that cause drowsiness. °? Make important decisions or sign legal documents. °? Take care of children on your own. °· Rest. °Eating and drinking °· If you vomit, drink water, juice, or soup when you can drink without vomiting. °· Drink enough fluid to keep your urine clear or pale yellow. °· Make sure you have little or no nausea before eating solid foods. °· Follow the diet recommended by your health care provider. °General instructions °· Have a responsible adult stay with you until you are awake and alert. °· Return to your normal activities as told by your health care provider. Ask your health care provider what activities are safe for you. °· Take over-the-counter and prescription medicines only as told by  your health care provider. °· If you smoke, do not smoke without supervision. °· Keep all follow-up visits as told by your health care provider. This is important. °Contact a health care provider if: °· You continue to have nausea or vomiting at home, and medicines are not helpful. °· You cannot drink fluids or start eating again. °· You cannot urinate after 8-12 hours. °· You develop a skin rash. °· You have fever. °· You have increasing redness at the site of your procedure. °Get help right away if: °· You have difficulty breathing. °· You have chest pain. °· You have unexpected bleeding. °· You feel that you are having a life-threatening or urgent problem. °This information is not intended to replace advice given to you by your health care provider. Make sure you discuss any questions you have with your health care provider. °Document Released: 10/24/2000 Document Revised: 12/21/2015 Document Reviewed: 07/02/2015 °Elsevier Interactive Patient Education © 2018 Elsevier Inc. ° °

## 2018-06-07 NOTE — Transfer of Care (Signed)
Immediate Anesthesia Transfer of Care Note  Patient: [name]  Procedure(s) Performed: SEPTOPLASTY (Bilateral Nose) INFERIOR TURBINATE REDUCTION (Bilateral Nose)  Patient Location: PACU  Anesthesia Type: General ETT  Level of Consciousness: awake, alert  and patient cooperative  Airway and Oxygen Therapy: Patient Spontanous Breathing and Patient connected to supplemental oxygen  Post-op Assessment: Post-op Vital signs reviewed, Patient's Cardiovascular Status Stable, Respiratory Function Stable, Patent Airway and No signs of Nausea or vomiting  Post-op Vital Signs: Reviewed and stable  Complications: No apparent anesthesia complications

## 2018-06-08 ENCOUNTER — Encounter: Payer: Self-pay | Admitting: Otolaryngology

## 2018-06-16 IMAGING — US US RENAL
1 series · 14 of 25 positions shown · non-contrast
Comparison: Ultrasound the kidneys of 07/14/2016 and CT abdomen
pelvis of 06/27/2016

CLINICAL DATA: History of kidney stones

EXAM:
RENAL / URINARY TRACT ULTRASOUND COMPLETE

[Series 1: [facility] renal · 0.25mm/px · 14 of 50 slices shown]
[im 1/50]
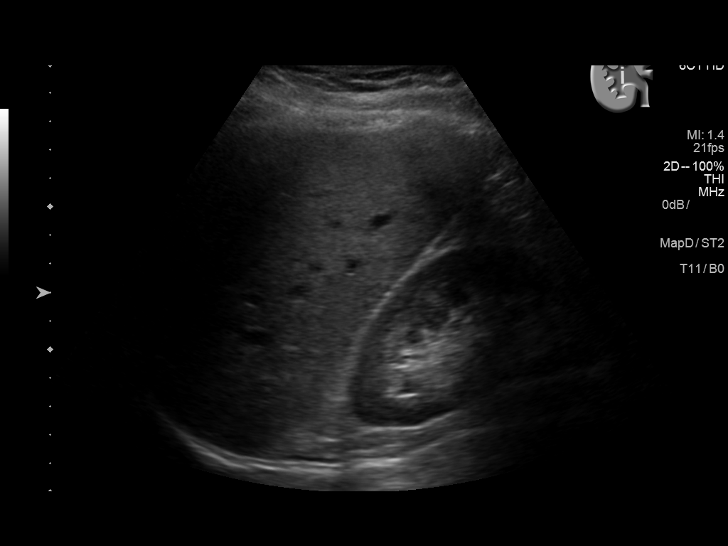
[im 5/50]
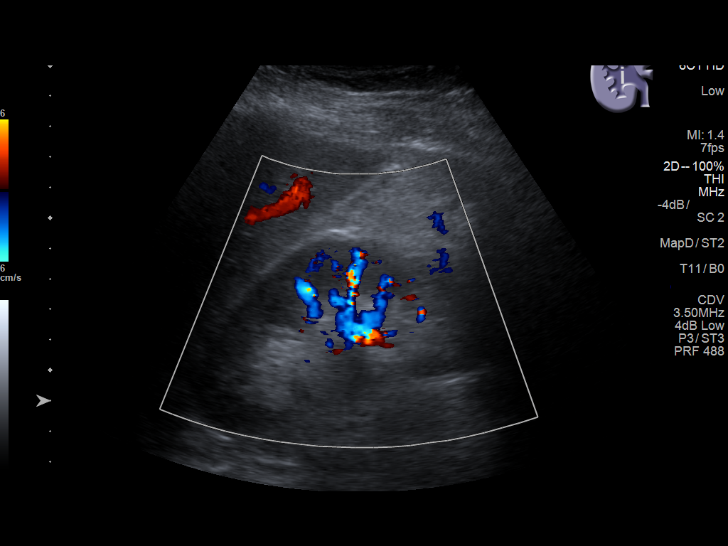
[im 9/50]
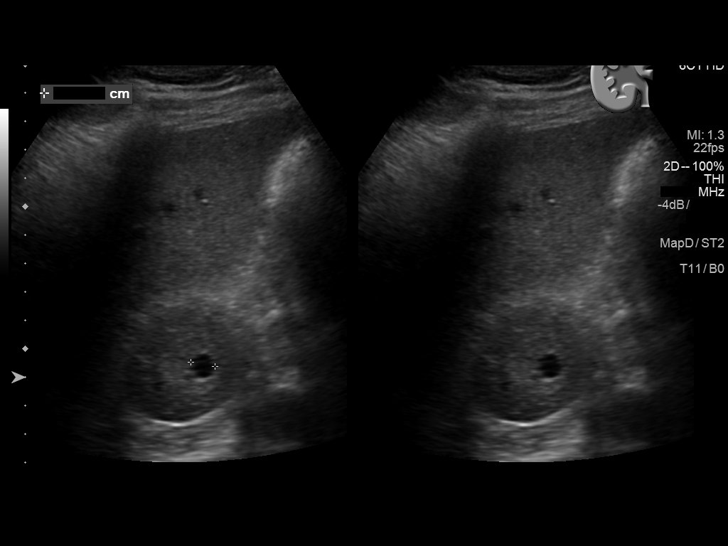
[im 13/50]
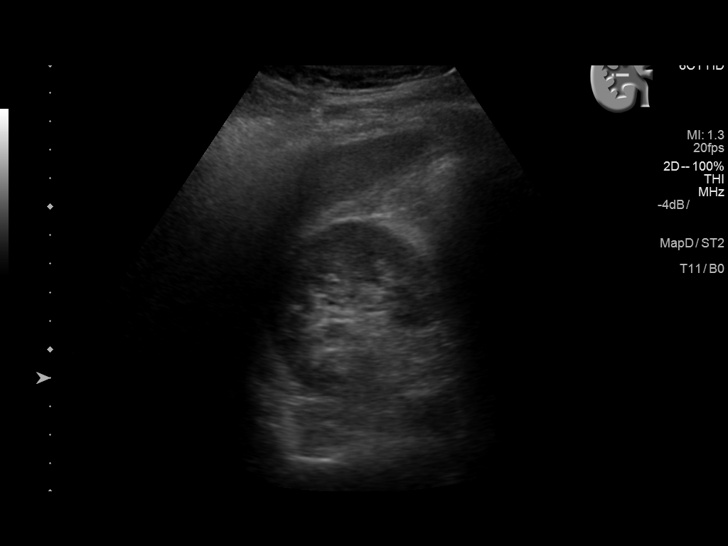
[im 17/50]
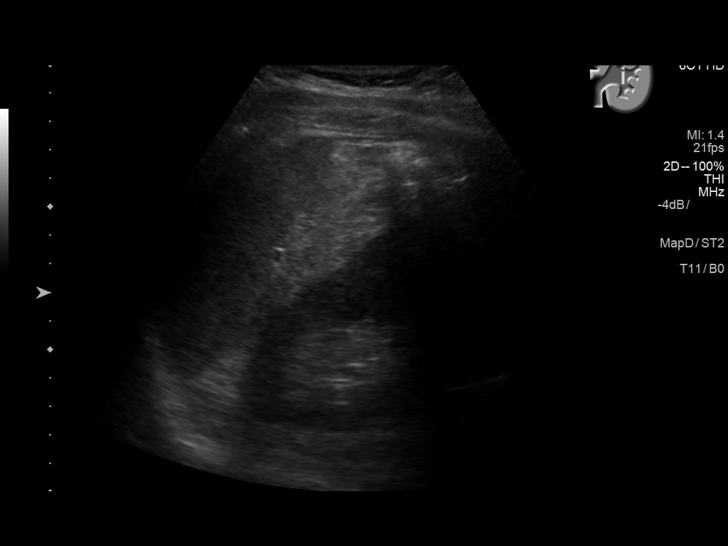
[im 19/50]
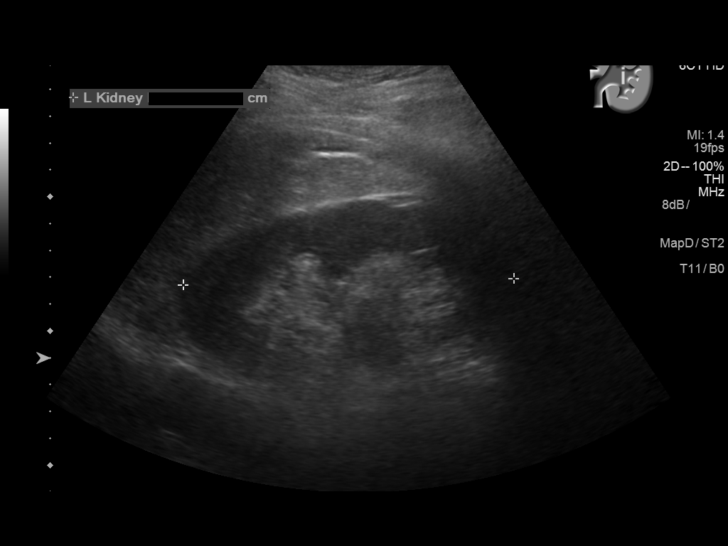
[im 23/50]
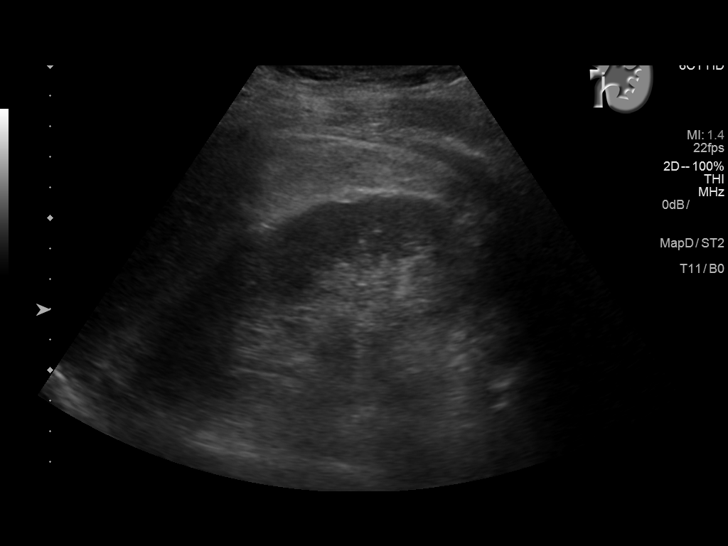
[im 27/50]
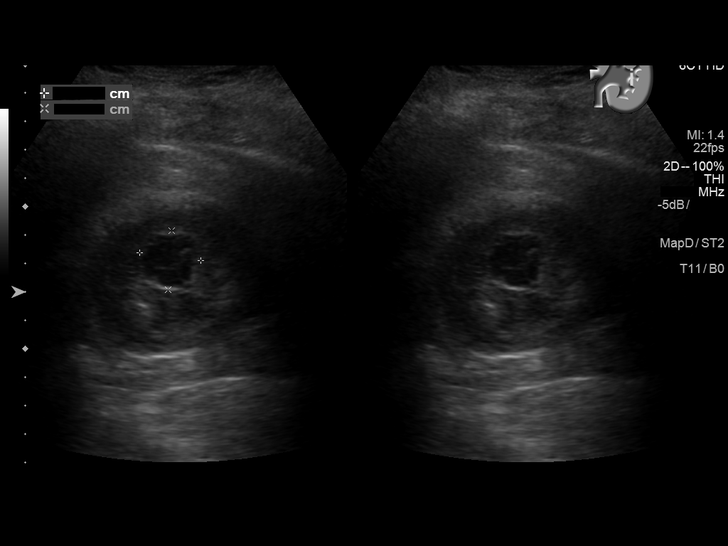
[im 31/50]
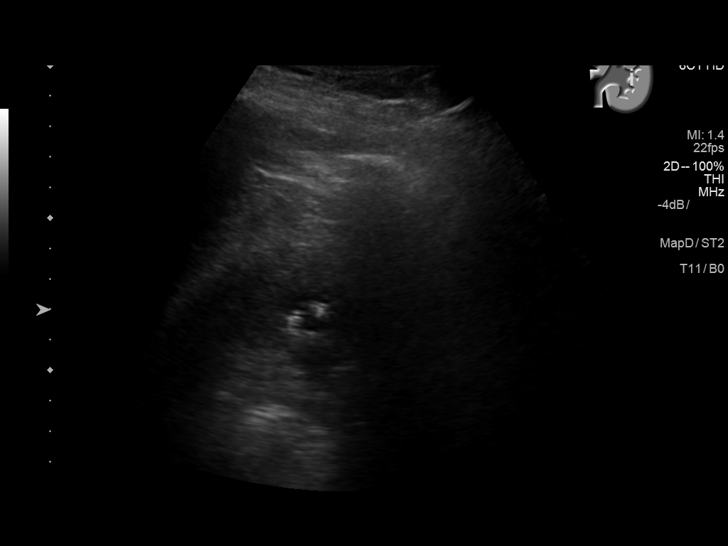
[im 33/50]
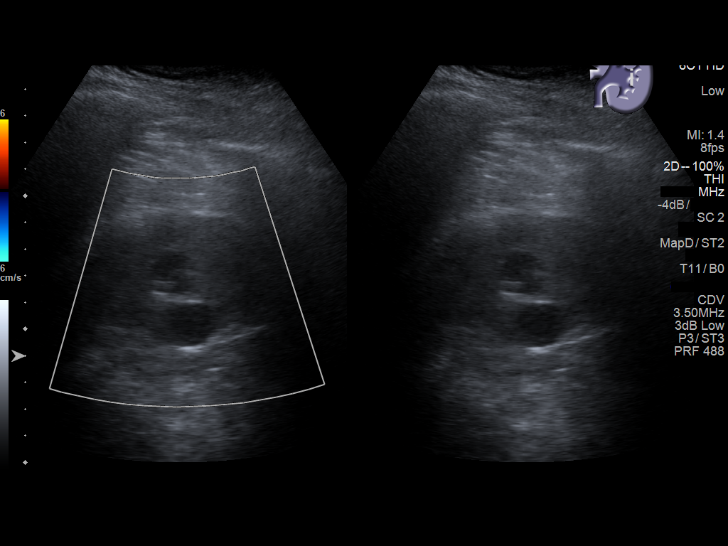
[im 37/50]
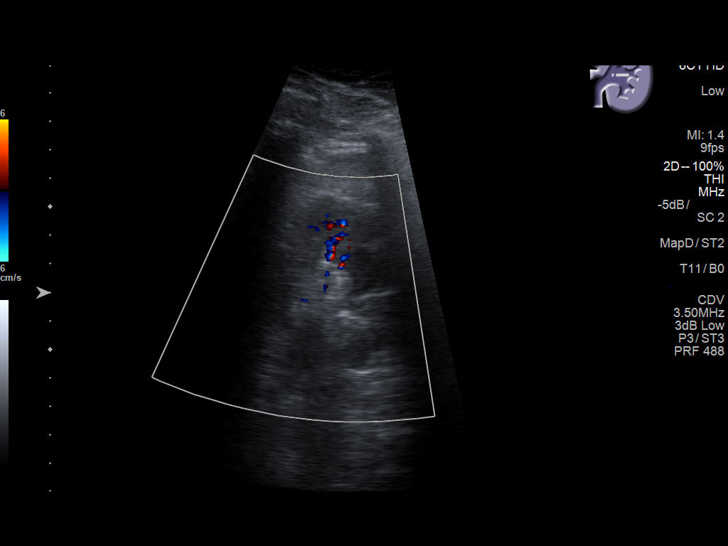
[im 41/50]
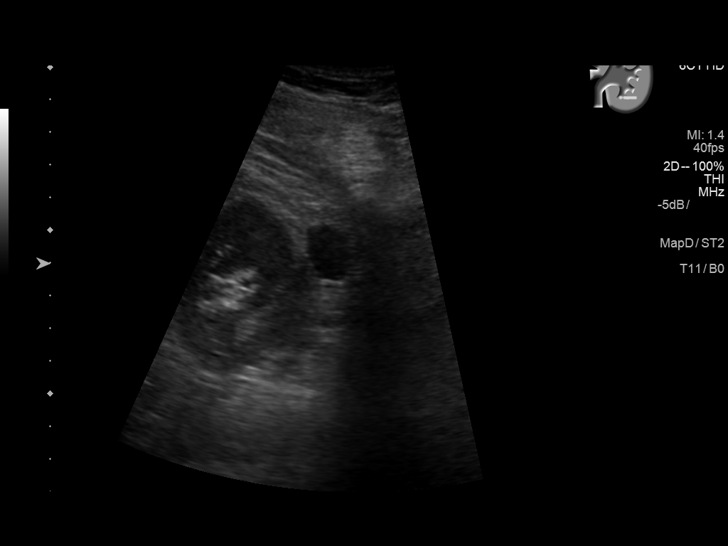
[im 45/50]
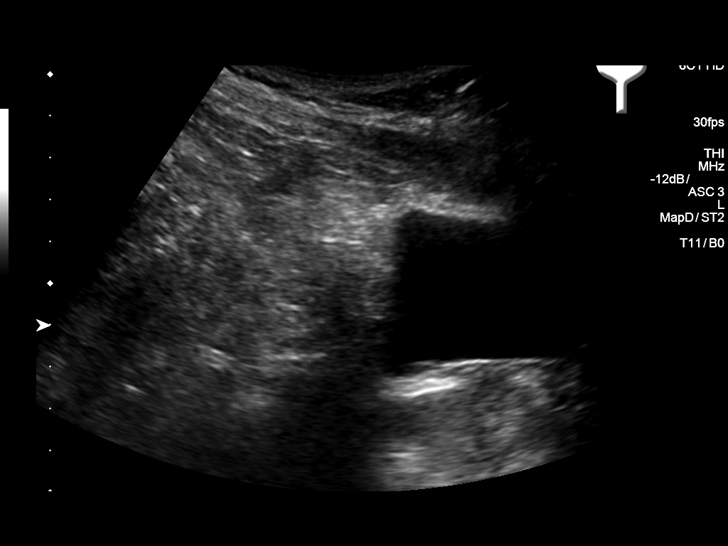
[im 50/50]
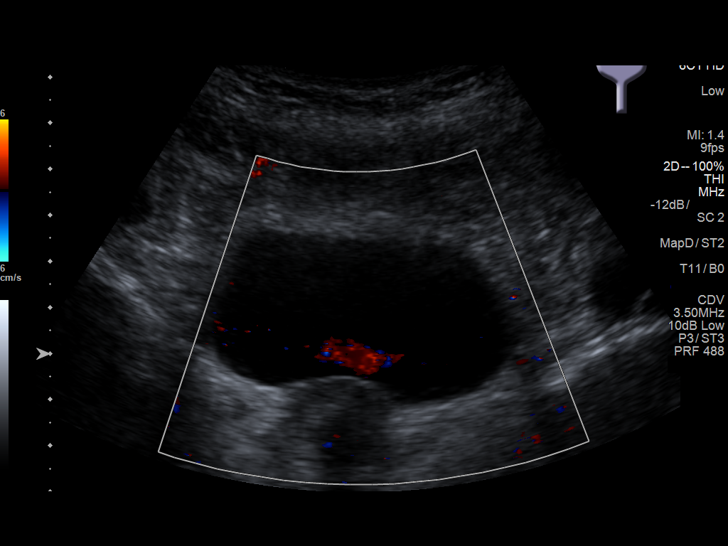

[14 of 25 positions shown; findings below may reference images not displayed]

FINDINGS: Right Kidney:

Length: 11.1 cm.. No hydronephrosis is seen. A cyst is present in
the upper pole of 1.1 cm.

Left Kidney:

Length: 12.3 cm.. No hydronephrosis is seen. There may be an
extrarenal pelvis present which is seen on prior CT. There are
several cysts present in the lower pole, the largest of 2.2 cm. A
hypoechoic structure in the mid lower kidney appears to contain some
calcification. This was faintly seen on prior unenhanced CT.

Bladder:

The urinary bladder is moderately urine distended with bilateral
ureteral jets noted.
IMPRESSION: 1. No hydronephrosis.  Probable extrarenal pelvis on the left.
2. Bilateral renal cysts one of which appears partially calcified on
the left.
3. No definite renal calculi are seen.

## 2019-01-09 ENCOUNTER — Ambulatory Visit: Payer: Managed Care, Other (non HMO) | Admitting: Adult Health

## 2019-01-09 ENCOUNTER — Other Ambulatory Visit: Payer: Self-pay

## 2019-01-09 ENCOUNTER — Encounter: Payer: Self-pay | Admitting: Adult Health

## 2019-01-09 VITALS — BP 112/78 | HR 67 | Temp 98.4°F | Resp 16 | Ht 66.0 in | Wt 215.0 lb

## 2019-01-09 DIAGNOSIS — K219 Gastro-esophageal reflux disease without esophagitis: Secondary | ICD-10-CM | POA: Insufficient documentation

## 2019-01-09 DIAGNOSIS — Z0189 Encounter for other specified special examinations: Secondary | ICD-10-CM

## 2019-01-09 DIAGNOSIS — E663 Overweight: Secondary | ICD-10-CM | POA: Insufficient documentation

## 2019-01-09 DIAGNOSIS — M2550 Pain in unspecified joint: Secondary | ICD-10-CM | POA: Insufficient documentation

## 2019-01-09 DIAGNOSIS — Z008 Encounter for other general examination: Secondary | ICD-10-CM

## 2019-01-09 DIAGNOSIS — G4733 Obstructive sleep apnea (adult) (pediatric): Secondary | ICD-10-CM | POA: Insufficient documentation

## 2019-01-09 NOTE — Patient Instructions (Signed)

## 2019-01-09 NOTE — Progress Notes (Signed)
[name] DOB: 48 y.o. MRN: [id number]  Subjective:  Here for Biometric Screen/brief exam He denies any health concerns at this time. 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   Patient  denies any fever, body aches,chills, rash, chest pain, shortness of breath, nausea, vomiting, or diarrhea.  He has a primary care MD [name], Alex Begin, MD and reports that he needs to call him and schedule a appointment for his full physical. Objective: Blood pressure 112/78, pulse 67, temperature 98.4 F (36.9 C), temperature source Oral, resp. rate 16, height 5\' 6"  (1.676 m), weight 215 lb (97.5 kg), SpO2 98 %. NAD HEENT: Within normal limits Neck: Normal, neck supple  Heart: Regular rate and rhythm Lungs: Clear  Patient is alert and oriented and responsive to questions Engages in eye contact with provider. Speaks in full sentences without any pauses without any shortness of breath or distress.  Patient moves on and off of exam table and in room without difficulty. Gait is normal in hall and in room. Patient is oriented to person place time and situation. Patient answers questions appropriately and engages in conversation.  Assessment: Biometric screen   Plan: Follow up with your primary care [name], Alex Begin, MD  For male health maintenance within 1 month.  Fasting glucose and lipids. Discussed with patient that today's visit here is a limited biometric screening visit (not a comprehensive exam or management of any chronic problems) Discussed some health issues, including healthy eating habits and exercise. Encouraged to follow-up with PCP for annual comprehensive preventive and wellness care (and if applicable, any chronic issues). Questions invited and answered.   Follow up with primary care as needed for chronic and maintenance health care- can be seen in this employee clinic for  acute care.

## 2019-01-10 LAB — LIPID PANEL WITH LDL/HDL RATIO
Cholesterol, Total: 165 mg/dL (ref 100–199)
HDL: 79 mg/dL (ref >39)
LDL Calculated: 74 mg/dL (ref 0–99)
LDl/HDL Ratio: 0.9 ratio (ref 0.0–3.6)
Triglycerides: 58 mg/dL (ref 0–149)
VLDL Cholesterol Cal: 12 mg/dL (ref 5–40)

## 2019-01-10 LAB — GLUCOSE, RANDOM: Glucose: 86 mg/dL (ref 65–99)

## 2020-05-08 ENCOUNTER — Encounter: Payer: Self-pay | Admitting: Nurse Practitioner

## 2020-05-08 ENCOUNTER — Ambulatory Visit: Payer: Managed Care, Other (non HMO) | Admitting: Nurse Practitioner

## 2020-05-08 VITALS — BP 130/79 | HR 64 | Ht 66.0 in | Wt 230.0 lb

## 2020-05-08 DIAGNOSIS — Z008 Encounter for other general examination: Secondary | ICD-10-CM | POA: Diagnosis not present

## 2020-05-08 DIAGNOSIS — Z0000 Encounter for general adult medical examination without abnormal findings: Secondary | ICD-10-CM | POA: Diagnosis not present

## 2020-05-08 NOTE — Patient Instructions (Signed)
Immunization Schedule, 35-51 Years Old  Vaccines are usually given at various ages, according to a schedule. Your health care provider will recommend vaccines for you based on your age, medical history, and lifestyle or other factors such as travel or where you work. You may receive vaccines as individual doses or as more than one vaccine together in one shot (combination vaccines). Talk with your health care provider about the risks and benefits of combination vaccines. Recommended immunizations for 110-1 years old Influenza vaccine  You should get a dose of the influenza vaccine every year. Tetanus, diphtheria, and pertussis vaccine A vaccine that protects against tetanus, diphtheria, and pertussis is known as the Tdap vaccine. A vaccine that protects against tetanus and diphtheria is known as the Td vaccine.  You should only get the Td vaccine if you have had at least 1 dose of the Tdap vaccine.  You should get 1 dose of the Td or Tdap vaccine every 10 years, or you should get 1 dose of the Tdap vaccine if: ? You have not previously gotten a Tdap vaccine. ? You do not know if you have ever gotten a Tdap vaccine. Zoster vaccine This is also known as the RZV vaccine. You should get 2 doses of the RZV vaccine 2 to 6 months apart. It is important to get the RZV vaccine even if you:  Have had shingles.  Have received the ZVL vaccine, an older version of the RZV vaccine.  Are unsure if you have had chickenpox (varicella). Pneumococcal conjugate vaccine This is also known as the PCV13 vaccine. You should get the PCV13 vaccine as recommended if you have certain high-risk conditions. These include:  Diabetes.  Chronic conditions of the heart, lungs, or liver.  Conditions that affect the body's disease-fighting system (immune system). Pneumococcal polysaccharide vaccine This is also known as the PPSV23 vaccine. You should get the PPSV23 vaccine as recommended if you have certain high-risk  conditions. These include:  Diabetes.  Chronic conditions of the heart, lungs, or liver.  Conditions that affect the immune system. Hepatitis A vaccine This is also known as the HepA vaccine. If you did not get the HepA vaccine previously, you should get it if:  You are at risk for a hepatitis A infection. You may be at risk for infection if you: ? Have chronic liver disease. ? Have HIV or AIDS. ? Are a man who has sex with men. ? Use drugs. ? Are homeless. ? May be exposed to hepatitis A through work. ? Travel to countries where hepatitis A is common. ? Have or will have close contact with someone who was adopted from another country.  You are not at risk for infection but want protection from hepatitis A. Hepatitis B vaccine This is also known as the HepB vaccine. If you did not get the HepB vaccine previously, you should get it if:  You are at risk for hepatitis B infection. You are at risk if you: ? Have chronic liver disease. ? Have HIV or AIDS. ? Have sex with a partner who has hepatitis B, or:  You have multiple sex partners.  You are a man who has sex with men. ? Use drugs. ? May be exposed to hepatitis B through work. ? Live with someone who has hepatitis B. ? Receive dialysis treatment. ? Have diabetes. ? Travel to countries where hepatitis B is common.  You are not at risk of infection but want protection from hepatitis B. Measles, mumps, and  rubella vaccine This is also known as the MMR vaccine. You may need to get the MMR vaccine if you were born in 1957 or later and:  You need to catch up on doses you missed in the past.  You have not been given the vaccine before.  You do not have evidence of immunity (by a blood test). Varicella vaccine This is also known as the VAR vaccine. You may need to get the VAR vaccine if you do not have evidence of immunity (by a blood test) and you may be exposed to varicella through work. This is especially important if you  work in a health care setting. Meningococcal conjugate vaccine This is also known as the MenACWY vaccine. You may need to get the MenACWY vaccine if you:  Have not been given the vaccine before.  Need to catch up on doses you missed in the past. This vaccine is especially important if you:  Do not have a spleen.  Have sickle cell disease.  Have HIV.  Take medicines that suppress your immune system.  Travel to countries where meningococcal disease is common.  Are exposed to Neisseria meningitidis at work. Serogroup B meningococcal vaccine This is also known as the MenB vaccine. You may need to get the MenB vaccine if you:  Have not been given the vaccine before.  Need to catch up on doses you missed in the past. This vaccine is especially important if you:  Do not have a spleen.  Have sickle cell disease.  Take medicines that suppress your immune system.  Are exposed to Neisseria meningitidis at work. Haemophilus influenzae type b vaccine This is also known as the Hib vaccine. Anyone older than 51 years of age is usually not given the Hib vaccine. However, if you have certain high-risk conditions, you may need to get this vaccine. These conditions include:  Not having a spleen.  Having received a stem cell transplant. Before you get a vaccine: Talk with your health care provider about which vaccines are right for you. This is especially important if:  You previously had a reaction after getting a vaccine.  You have a weakened immune system. You may have a weakened immune system if you: ? Are taking medicines that reduce (suppress) the activity of your immune system. ? Are taking medicines to treat cancer (chemotherapy). ? Have HIV or AIDS.  You work in an environment where you may be exposed to a disease.  You plan to travel outside of the country.  You have a chronic illness, such as heart disease, kidney disease, diabetes, or lung disease. Summary  Before you  get a vaccine, tell your health care provider if you have reacted to vaccines in the past or have a condition that weakens your immune system.  At 50-64 years, you should get a dose of the flu vaccine every year and a dose of the Td or Tdap vaccine every 10 years.  You should get 2 doses of the RZV vaccine 2 to 6 months apart.  Depending on your medical history and your risk factors, you may need other vaccines. Ask your health care provider whether you are up to date on all your vaccines. This information is not intended to replace advice given to you by your health care provider. Make sure you discuss any questions you have with your health care provider. Document Revised: 05/14/2019 Document Reviewed: 05/14/2019 Elsevier Patient Education  [place] Maintenance, Male Adopting a healthy lifestyle and  getting preventive care are important in promoting health and wellness. Ask your health care provider about:  The right schedule for you to have regular tests and exams.  Things you can do on your own to prevent diseases and keep yourself healthy. What should I know about diet, weight, and exercise? Eat a healthy diet   Eat a diet that includes plenty of vegetables, fruits, low-fat dairy products, and lean protein.  Do not eat a lot of foods that are high in solid fats, added sugars, or sodium. Maintain a healthy weight Body mass index (BMI) is a measurement that can be used to identify possible weight problems. It estimates body fat based on height and weight. Your health care provider can help determine your BMI and help you achieve or maintain a healthy weight. Get regular exercise Get regular exercise. This is one of the most important things you can do for your health. Most adults should:  Exercise for at least 150 minutes each week. The exercise should increase your heart rate and make you sweat (moderate-intensity exercise).  Do strengthening exercises at least  twice a week. This is in addition to the moderate-intensity exercise.  Spend less time sitting. Even light physical activity can be beneficial. Watch cholesterol and blood lipids Have your blood tested for lipids and cholesterol at 51 years of age, then have this test every 5 years. You may need to have your cholesterol levels checked more often if:  Your lipid or cholesterol levels are high.  You are older than 51 years of age.  You are at high risk for heart disease. What should I know about cancer screening? Many types of cancers can be detected early and may often be prevented. Depending on your health history and family history, you may need to have cancer screening at various ages. This may include screening for:  Colorectal cancer.  Prostate cancer.  Skin cancer.  Lung cancer. What should I know about heart disease, diabetes, and high blood pressure? Blood pressure and heart disease  High blood pressure causes heart disease and increases the risk of stroke. This is more likely to develop in people who have high blood pressure readings, are of African descent, or are overweight.  Talk with your health care provider about your target blood pressure readings.  Have your blood pressure checked: ? Every 3-5 years if you are 39-69 years of age. ? Every year if you are 26 years old or older.  If you are between the ages of 32 and 30 and are a current or former smoker, ask your health care provider if you should have a one-time screening for abdominal aortic aneurysm (AAA). Diabetes Have regular diabetes screenings. This checks your fasting blood sugar level. Have the screening done:  Once every three years after age 61 if you are at a normal weight and have a low risk for diabetes.  More often and at a younger age if you are overweight or have a high risk for diabetes. What should I know about preventing infection? Hepatitis B If you have a higher risk for hepatitis B, you  should be screened for this virus. Talk with your health care provider to find out if you are at risk for hepatitis B infection. Hepatitis C Blood testing is recommended for:  Everyone born from [birth year] through 1965.  Anyone with known risk factors for hepatitis C. Sexually transmitted infections (STIs)  You should be screened each year for STIs, including gonorrhea and chlamydia, if: ?  You are sexually active and are younger than 51 years of age. ? You are older than 51 years of age and your health care provider tells you that you are at risk for this type of infection. ? Your sexual activity has changed since you were last screened, and you are at increased risk for chlamydia or gonorrhea. Ask your health care provider if you are at risk.  Ask your health care provider about whether you are at high risk for HIV. Your health care provider may recommend a prescription medicine to help prevent HIV infection. If you choose to take medicine to prevent HIV, you should first get tested for HIV. You should then be tested every 3 months for as long as you are taking the medicine. Follow these instructions at home: Lifestyle  Do not use any products that contain nicotine or tobacco, such as cigarettes, e-cigarettes, and chewing tobacco. If you need help quitting, ask your health care provider.  Do not use street drugs.  Do not share needles.  Ask your health care provider for help if you need support or information about quitting drugs. Alcohol use  Do not drink alcohol if your health care provider tells you not to drink.  If you drink alcohol: ? Limit how much you have to 0-2 drinks a day. ? Be aware of how much alcohol is in your drink. In the [place] one drink equals one 12 oz bottle of beer (355 mL), one 5 oz glass of wine (148 mL), or one 1 oz glass of hard liquor (44 mL). General instructions  Schedule regular health, dental, and eye exams.  Stay current with your vaccines.  Tell your  health care provider if: ? You often feel depressed. ? You have ever been abused or do not feel safe at home. Summary  Adopting a healthy lifestyle and getting preventive care are important in promoting health and wellness.  Follow your health care provider's instructions about healthy diet, exercising, and getting tested or screened for diseases.  Follow your health care provider's instructions on monitoring your cholesterol and blood pressure. This information is not intended to replace advice given to you by your health care provider. Make sure you discuss any questions you have with your health care provider. Document Revised: 07/11/2018 Document Reviewed: 07/11/2018 Elsevier Patient Education  2020 [employer].

## 2020-05-08 NOTE — Progress Notes (Signed)
Subjective:     Patient ID: [name], male   DOB: [birth year], 49 y.o.   MRN: [id number]  HPI  [name] is a 49 y.o. male who presents to the [facility] for his annual biometric screening exam. He is employed in the [employer] and has been there for 3 years. He reports that he has had a sinus infection over the past week and is taking Cephalexin. He reports he has had chronic sinus infections and sinus surgery 2 years ago that did cut his headaches in half.   PCP: Dr. Zada Finders  PMH: OSA, GERD, Chronic sinusitis, Kidney stones, over weight, sepsis with UTI Surg: Sinus, Gastric sleeve, hernia, cholecystectomy, R knee  SH: Denies use of tobacco, ETOH or drugs Imm: UTD, has had Covid vaccine Diet/exercise: none  Meds: Prilosec  Allergies: Penicillin   Review of Systems  HENT: Positive for sinus pressure and sinus pain.   All other systems reviewed and are negative.      Objective: BP 130/79 (BP Location: Right Arm, Patient Position: Sitting, Cuff Size: Normal)   Pulse 64   Ht 5\' 6"  (1.676 m)   Wt 230 lb (104.3 kg)   BMI 37.12 kg/m     Physical Exam Vitals and nursing note reviewed.  Constitutional:      Appearance: Normal appearance.  HENT:     Head: Normocephalic and atraumatic.     Jaw: No trismus.     Right Ear: Tympanic membrane, ear canal and external ear normal.     Left Ear: Tympanic membrane, ear canal and external ear normal.     Mouth/Throat:     Mouth: Mucous membranes are moist.     Pharynx: Oropharynx is clear.  Eyes:     Extraocular Movements: Extraocular movements intact.     Conjunctiva/sclera: Conjunctivae normal.     Pupils: Pupils are equal, round, and reactive to light.  Neck:     Trachea: Trachea normal. No tracheal tenderness.  Cardiovascular:     Rate and Rhythm: Normal rate and regular rhythm.     Heart sounds: No murmur heard.   Abdominal:     Tenderness: There is no abdominal tenderness.  Musculoskeletal:        General: Normal  range of motion.     Cervical back: Normal range of motion. No rigidity. No spinous process tenderness or muscular tenderness.     Right lower leg: No edema.     Left lower leg: No edema.  Skin:    General: Skin is warm and dry.  Neurological:     Mental Status: He is alert.     Cranial Nerves: No cranial nerve deficit.     Motor: No weakness or pronator drift.     Coordination: Romberg sign negative.     Gait: Gait is intact.     Deep Tendon Reflexes:     Reflex Scores:      Bicep reflexes are 2+ on the right side and 2+ on the left side.      Brachioradialis reflexes are 2+ on the right side and 2+ on the left side.      Patellar reflexes are 2+ on the right side and 2+ on the left side.    Comments: Ambulatory with steady gait. Stands on one foot without difficulty. Grips are equal radial pulses 2+.   Psychiatric:        Attention and Perception: Attention normal.        Mood and  Affect: Mood normal.        Speech: Speech normal.        Behavior: Behavior normal.        Thought Content: Thought content normal.       Assessment:    1. Encounter for biometric screening - Lipid panel - Glucose, random  2. Encounter for preventive health examination - Lipid panel - Glucose, random - Limited physical exam today is normal other than sinus infection.     Plan:    Discussed with the employee diet/exercise and immunizations. Information given verbally and with written d/c instructions. Employee given the opportunity to ask questions. All questions answered and employee voices understanding. RTC in one year or sooner if needed. Continue regular visits with PCP.

## 2020-05-09 LAB — LIPID PANEL
Chol/HDL Ratio: 2.4 ratio (ref 0.0–5.0)
Cholesterol, Total: 171 mg/dL (ref 100–199)
HDL: 70 mg/dL (ref >39)
LDL Chol Calc (NIH): 90 mg/dL (ref 0–99)
Triglycerides: 57 mg/dL (ref 0–149)
VLDL Cholesterol Cal: 11 mg/dL (ref 5–40)

## 2020-05-09 LAB — GLUCOSE, RANDOM: Glucose: 85 mg/dL (ref 65–99)

## 2020-05-26 ENCOUNTER — Other Ambulatory Visit: Payer: Self-pay | Admitting: Otolaryngology

## 2020-05-26 DIAGNOSIS — J329 Chronic sinusitis, unspecified: Secondary | ICD-10-CM

## 2020-06-02 ENCOUNTER — Ambulatory Visit
Admission: RE | Admit: 2020-06-02 | Discharge: 2020-06-02 | Disposition: A | Discharge status: Home / Self Care | Payer: Managed Care, Other (non HMO) | Source: Ambulatory Visit | Attending: Otolaryngology | Admitting: Otolaryngology

## 2020-06-02 ENCOUNTER — Other Ambulatory Visit: Payer: Self-pay

## 2020-06-02 DIAGNOSIS — J329 Chronic sinusitis, unspecified: Secondary | ICD-10-CM | POA: Insufficient documentation

## 2020-07-21 ENCOUNTER — Encounter: Payer: Self-pay | Admitting: Physician Assistant

## 2020-07-21 ENCOUNTER — Ambulatory Visit: Payer: Managed Care, Other (non HMO) | Admitting: Physician Assistant

## 2020-07-21 DIAGNOSIS — Z9189 Other specified personal risk factors, not elsewhere classified: Secondary | ICD-10-CM | POA: Diagnosis not present

## 2020-07-21 NOTE — Progress Notes (Signed)
°  Subjective:     Patient ID: [name], male   DOB: [birth year], 49 y.o.   MRN: [id number]  HPI PHONE VISIT 49 yo M calls with questions about possible recent covid exposure He has had Covid vaccine and also experienced a course of Covid last August Works for the [employer] in [place] [employer]- lots of exposure to [employer]- has been trained in masking and glove use.  Reports that his brother ([id number]) has just died of Covid on 07/11/2023 and that mans' wife [id number]) died the week following on 07-18-23. They had no vaccines- did not "believe in them" [name] had the responsibility of going to their home and cleaning up so it could be released from rental agreement. He states he wore mask and gloves, but concerned because  Of very soiled condition of house and belongings.  3 days ago ( the evening following his cleaning ) he had increased nasal discharge and post nasal drip with some throat irritation. Has history of GERD so he increased his OTC  Prilosec from once to twice daily yesterday. No change yet.  He has developed a non productive, shallow cough. Afebrile 97.4 Questions whether related to the cleaning process or if he might have Covid. Spoke to his PCP this morning and they recommend testing  Wishes my opnion  Review of Systems As noted above    Objective:   Physical Exam Phone visit- findings as reported by patient    Assessment:     Non productive cough,recent onset Covid exposure possible    Plan:     Get Covid testing for evaluation and reassurance Reviewed careful gloves and mask usage-as well as on/off procedures Daughter 20 of the home has had vaccine- but encouraged [name] to minimize exposure to family spaces until test results available Suspect cough related to dust/dander exposure Request info update with Covid test results

## 2020-12-22 ENCOUNTER — Ambulatory Visit: Payer: Managed Care, Other (non HMO) | Admitting: Physician Assistant

## 2020-12-22 ENCOUNTER — Encounter: Payer: Self-pay | Admitting: Physician Assistant

## 2020-12-22 VITALS — BP 138/84 | HR 62 | Temp 97.7°F | Resp 16 | Ht 66.0 in | Wt 239.0 lb

## 2020-12-22 DIAGNOSIS — Z0000 Encounter for general adult medical examination without abnormal findings: Secondary | ICD-10-CM

## 2020-12-22 DIAGNOSIS — Z008 Encounter for other general examination: Secondary | ICD-10-CM

## 2020-12-22 NOTE — Progress Notes (Signed)
Subjective:    Patient ID: [name], male    DOB: 11/02/1970, 50 y.o.   MRN: [id number]  HPI   [place] is a 33 you M who presents for [facility] and general exam  He had good weight loss after a sleeve procedure in 2014 2020 -215;  2021-230 and now 2022- 239    5'6"   B/P   138/84 His sleep apnea resolved after the sleeve and he does not use CPAP Encouraged to initiate an exercise program and increased diet awarenes With healthy food choices and portion control.  Brother died of Covid 08/03/2020, sister in law a month later- [place] got sick after  Being responsible for their care and cleaning their home after the illness  Parents have both died within the last 5 years--dad first,  mother second with a prolonged deteriorating dementia that was particularly hard as a caretaker.  He has a very good relationship with teen daughter who has now decide to proceed with a nursing career in college in [place]. The decision seemed to stem from experience with grandparent-- which will bring a good result to a difficult situation- and he is particularly pleased she is not going far away. They already plan to celebrate her membership on [employer] with frequent college sports in the future  Review of Systems Right knee with ongoing pain- diagnosed years ago with partially torn meniscus   years ago- plans to undergo surgical procedure in next months in hopes of better function and decrease discomfort  GERD continues to be an issue. Has a bed with flex mattress but might also benefit from [facility]  Blocks to eliminate flexed position. He tried to eat 3 hours before beditme--needs to keep evaluation for trigger foods and drinks  Objective:   Physical Exam Vitals and nursing note reviewed.  Constitutional:      Appearance: Normal appearance. He is obese.  HENT:     Head: Normocephalic and atraumatic.     Comments: glasses    Right Ear: Tympanic membrane, ear canal and external ear normal.     Left  Ear: Tympanic membrane, ear canal and external ear normal.     Nose: Nose normal.     Mouth/Throat:     Mouth: Mucous membranes are moist.     Comments: Dental care encouraged- tartar and gingivitis present Has recognized anxiety about DDS visits and states it has been " about a year'..though does not deny it could have been longer since last care   Eyes:     Extraocular Movements: Extraocular movements intact.  Cardiovascular:     Rate and Rhythm: Normal rate and regular rhythm.     Pulses: Normal pulses.  Pulmonary:     Effort: Pulmonary effort is normal.     Breath sounds: Normal breath sounds.  Abdominal:     Palpations: Abdomen is soft.     Tenderness: There is no abdominal tenderness. There is no guarding.     Comments: GERD  Concerns- no tenderness with palpation No mass palpable  Genitourinary:    Comments: defer Musculoskeletal:        General: Tenderness present. Normal range of motion.     Cervical back: Normal range of motion.     Comments: Right knee mildly enlarged, good flexion and extension seated, favors slightly with ambulation  Lymphadenopathy:     Cervical: No cervical adenopathy.  Skin:    General: Skin is warm and dry.     Capillary Refill: Capillary refill takes  less than 2 seconds.  Neurological:     General: No focal deficit present.     Mental Status: He is alert.     Cranial Nerves: No cranial nerve deficit.     Deep Tendon Reflexes: Reflexes normal.  Psychiatric:        Mood and Affect: Mood normal.     Comments: Lost both parents in the past 5 years - seasons and holidays continue to be difficult.  Given talk time and support Has very good rapport with teenage daughter about to enter college- on [employer]       Assessment & Plan:   DASH diet for information on healthy food choices, serving sizes and sodium content - push back, no seconds, limit sweet and desserts  Want attention on overall weight and Blood Pressure,    Consider exploring  swim exercise for non-weight bearing- contact Ortho for their suggestions -anticipating surgery   Please locate DDS focus on patient care-- needs cleanings and  anxious about pain reassured that new dental processes are much more patient care focused  GERD info made available for review  Will report labs as available

## 2020-12-22 NOTE — Patient Instructions (Addendum)
Gastroesophageal Reflux Disease, Adult  Gastroesophageal reflux (GER) happens when acid from the stomach flows up into the tube that connects the mouth and the stomach (esophagus). Normally, food travels down the esophagus and stays in the stomach to be digested. With GER, food and stomach acid sometimes move back up into the esophagus. You may have a disease called gastroesophageal reflux disease (GERD) if the reflux:  Happens often.  Causes frequent or very bad symptoms.  Causes problems such as damage to the esophagus. When this happens, the esophagus becomes sore and swollen. Over time, GERD can make small holes (ulcers) in the lining of the esophagus. What are the causes? This condition is caused by a problem with the muscle between the esophagus and the stomach. When this muscle is weak or not normal, it does not close properly to keep food and acid from coming back up from the stomach. The muscle can be weak because of:  Tobacco use.  Pregnancy.  Having a certain type of hernia (hiatal hernia).  Alcohol use.  Certain foods and drinks, such as coffee, chocolate, onions, and peppermint. What increases the risk?  Being overweight.  Having a disease that affects your connective tissue.  Taking NSAIDs, such a ibuprofen. What are the signs or symptoms?  Heartburn.  Difficult or painful swallowing.  The feeling of having a lump in the throat.  A bitter taste in the mouth.  Bad breath.  Having a lot of saliva.  Having an upset or bloated stomach.  Burping.  Chest pain. Different conditions can cause chest pain. Make sure you see your doctor if you have chest pain.  Shortness of breath or wheezing.  A long-term cough or a cough at night.  Wearing away of the surface of teeth (tooth enamel).  Weight loss. How is this treated?  Making changes to your diet.  Taking medicine.  Having surgery. Treatment will depend on how bad your symptoms are. Follow these  instructions at home: Eating and drinking  Follow a diet as told by your doctor. You may need to avoid foods and drinks such as: ? Coffee and tea, with or without caffeine. ? Drinks that contain alcohol. ? Energy drinks and sports drinks. ? Bubbly (carbonated) drinks or sodas. ? Chocolate and cocoa. ? Peppermint and mint flavorings. ? Garlic and onions. ? Horseradish. ? Spicy and acidic foods. These include peppers, chili powder, curry powder, vinegar, hot sauces, and BBQ sauce. ? Citrus fruit juices and citrus fruits, such as oranges, lemons, and limes. ? Tomato-based foods. These include red sauce, chili, salsa, and pizza with red sauce. ? Fried and fatty foods. These include donuts, french fries, potato chips, and high-fat dressings. ? High-fat meats. These include hot dogs, rib eye steak, sausage, ham, and bacon. ? High-fat dairy items, such as whole milk, butter, and cream cheese.  Eat small meals often. Avoid eating large meals.  Avoid drinking large amounts of liquid with your meals.  Avoid eating meals during the 2-3 hours before bedtime.  Avoid lying down right after you eat.  Do not exercise right after you eat.   Lifestyle  Do not smoke or use any products that contain nicotine or tobacco. If you need help quitting, ask your doctor.  Try to lower your stress. If you need help doing this, ask your doctor.  If you are overweight, lose an amount of weight that is healthy for you. Ask your doctor about a safe weight loss goal.   General instructions    Pay attention to any changes in your symptoms.  Take over-the-counter and prescription medicines only as told by your doctor.  Do not take aspirin, ibuprofen, or other NSAIDs unless your doctor says it is okay.  Wear loose clothes. Do not wear anything tight around your waist.  Raise (elevate) the head of your bed about 6 inches (15 cm). You may need to use a wedge to do this.  Avoid bending over if this makes your  symptoms worse.  Keep all follow-up visits. Contact a doctor if:  You have new symptoms.  You lose weight and you do not know why.  You have trouble swallowing or it hurts to swallow.  You have wheezing or a cough that keeps happening.  You have a hoarse voice.  Your symptoms do not get better with treatment. Get help right away if:  You have sudden pain in your arms, neck, jaw, teeth, or back.  You suddenly feel sweaty, dizzy, or light-headed.  You have chest pain or shortness of breath.  You vomit and the vomit is green, yellow, or black, or it looks like blood or coffee grounds.  You faint.  Your poop (stool) is red, bloody, or black.  You cannot swallow, drink, or eat. These symptoms may represent a serious problem that is an emergency. Do not wait to see if the symptoms will go away. Get medical help right away. Call your local emergency services (911 in the [place] Do not drive yourself to the hospital. Summary  If a person has gastroesophageal reflux disease (GERD), food and stomach acid move back up into the esophagus and cause symptoms or problems such as damage to the esophagus.  Treatment will depend on how bad your symptoms are.  Follow a diet as told by your doctor.  Take all medicines only as told by your doctor. This information is not intended to replace advice given to you by your health care provider. Make sure you discuss any questions you have with your health care provider. Document Revised: 01/27/2020 Document Reviewed: 01/27/2020 Elsevier Patient Education  2021 Elsevier Inc. https://www.mata.com/https://www.nhlbi.nih.gov/files/docs/public/heart/dash_brief.pdf">  DASH Eating Plan DASH stands for Dietary Approaches to Stop Hypertension. The DASH eating plan is a healthy eating plan that has been shown to:  Reduce high blood pressure (hypertension).  Reduce your risk for type 2 diabetes, heart disease, and stroke.  Help with weight loss. What are tips for  following this plan? Reading food labels  Check food labels for the amount of salt (sodium) per serving. Choose foods with less than 5 percent of the Daily Value of sodium. Generally, foods with less than 300 milligrams (mg) of sodium per serving fit into this eating plan.  To find whole grains, look for the word "whole" as the first word in the ingredient list. Shopping  Buy products labeled as "low-sodium" or "no salt added."  Buy fresh foods. Avoid canned foods and pre-made or frozen meals. Cooking  Avoid adding salt when cooking. Use salt-free seasonings or herbs instead of table salt or sea salt. Check with your health care provider or pharmacist before using salt substitutes.  Do not fry foods. Cook foods using healthy methods such as baking, boiling, grilling, roasting, and broiling instead.  Cook with heart-healthy oils, such as olive, canola, avocado, soybean, or sunflower oil. Meal planning  Eat a balanced diet that includes: ? 4 or more servings of fruits and 4 or more servings of vegetables each day. Try to fill one-half of your plate with  fruits and vegetables. ? 6-8 servings of whole grains each day. ? Less than 6 oz (170 g) of lean meat, poultry, or fish each day. A 3-oz (85-g) serving of meat is about the same size as a deck of cards. One egg equals 1 oz (28 g). ? 2-3 servings of low-fat dairy each day. One serving is 1 cup (237 mL). ? 1 serving of nuts, seeds, or beans 5 times each week. ? 2-3 servings of heart-healthy fats. Healthy fats called omega-3 fatty acids are found in foods such as walnuts, flaxseeds, fortified milks, and eggs. These fats are also found in cold-water fish, such as sardines, salmon, and mackerel.  Limit how much you eat of: ? Canned or prepackaged foods. ? Food that is high in trans fat, such as some fried foods. ? Food that is high in saturated fat, such as fatty meat. ? Desserts and other sweets, sugary drinks, and other foods with added  sugar. ? Full-fat dairy products.  Do not salt foods before eating.  Do not eat more than 4 egg yolks a week.  Try to eat at least 2 vegetarian meals a week.  Eat more home-cooked food and less restaurant, buffet, and fast food.   Lifestyle  When eating at a restaurant, ask that your food be prepared with less salt or no salt, if possible.  If you drink alcohol: ? Limit how much you use to:  0-1 drink a day for women who are not pregnant.  0-2 drinks a day for men. ? Be aware of how much alcohol is in your drink. In the [place] one drink equals one 12 oz bottle of beer (355 mL), one 5 oz glass of wine (148 mL), or one 1 oz glass of hard liquor (44 mL). General information  Avoid eating more than 2,300 mg of salt a day. If you have hypertension, you may need to reduce your sodium intake to 1,500 mg a day.  Work with your health care provider to maintain a healthy body weight or to lose weight. Ask what an ideal weight is for you.  Get at least 30 minutes of exercise that causes your heart to beat faster (aerobic exercise) most days of the week. Activities may include walking, swimming, or biking.  Work with your health care provider or dietitian to adjust your eating plan to your individual calorie needs. What foods should I eat? Fruits All fresh, dried, or frozen fruit. Canned fruit in natural juice (without added sugar). Vegetables Fresh or frozen vegetables (raw, steamed, roasted, or grilled). Low-sodium or reduced-sodium tomato and vegetable juice. Low-sodium or reduced-sodium tomato sauce and tomato paste. Low-sodium or reduced-sodium canned vegetables. Grains Whole-grain or whole-wheat bread. Whole-grain or whole-wheat pasta. Brown rice. [name]. Bulgur. Whole-grain and low-sodium cereals. Pita bread. Low-fat, low-sodium crackers. Whole-wheat flour tortillas. Meats and other proteins Skinless chicken or [name]. Ground chicken or [name]. Pork with fat trimmed off.  Fish and seafood. Egg whites. Dried beans, peas, or lentils. Unsalted nuts, nut butters, and seeds. Unsalted canned beans. Lean cuts of beef with fat trimmed off. Low-sodium, lean precooked or cured meat, such as sausages or meat loaves. Dairy Low-fat (1%) or fat-free (skim) milk. Reduced-fat, low-fat, or fat-free cheeses. Nonfat, low-sodium ricotta or cottage cheese. Low-fat or nonfat yogurt. Low-fat, low-sodium cheese. Fats and oils Soft margarine without trans fats. Vegetable oil. Reduced-fat, low-fat, or light mayonnaise and salad dressings (reduced-sodium). Canola, safflower, olive, avocado, soybean, and sunflower oils. Avocado. Seasonings and condiments Herbs. Spices.  Seasoning mixes without salt. Other foods Unsalted popcorn and pretzels. Fat-free sweets. The items listed above may not be a complete list of foods and beverages you can eat. Contact a dietitian for more information. What foods should I avoid? Fruits Canned fruit in a light or heavy syrup. Fried fruit. Fruit in cream or butter sauce. Vegetables Creamed or fried vegetables. Vegetables in a cheese sauce. Regular canned vegetables (not low-sodium or reduced-sodium). Regular canned tomato sauce and paste (not low-sodium or reduced-sodium). Regular tomato and vegetable juice (not low-sodium or reduced-sodium). [name]. Olives. Grains Baked goods made with fat, such as croissants, muffins, or some breads. Dry pasta or rice meal packs. Meats and other proteins Fatty cuts of meat. Ribs. Fried meat. [name]. Bologna, salami, and other precooked or cured meats, such as sausages or meat loaves. Fat from the back of a pig (fatback). Bratwurst. Salted nuts and seeds. Canned beans with added salt. Canned or smoked fish. Whole eggs or egg yolks. Chicken or [name] with skin. Dairy Whole or 2% milk, cream, and half-and-half. Whole or full-fat cream cheese. Whole-fat or sweetened yogurt. Full-fat cheese. Nondairy creamers. Whipped toppings.  Processed cheese and cheese spreads. Fats and oils Butter. Stick margarine. Lard. Shortening. Ghee. Bacon fat. Tropical oils, such as coconut, palm kernel, or palm oil. Seasonings and condiments Onion salt, garlic salt, seasoned salt, table salt, and sea salt. Worcestershire sauce. Tartar sauce. Barbecue sauce. Teriyaki sauce. Soy sauce, including reduced-sodium. Steak sauce. Canned and packaged gravies. Fish sauce. Oyster sauce. Cocktail sauce. Store-bought horseradish. Ketchup. Mustard. Meat flavorings and tenderizers. Bouillon cubes. Hot sauces. Pre-made or packaged marinades. Pre-made or packaged taco seasonings. Relishes. Regular salad dressings. Other foods Salted popcorn and pretzels. The items listed above may not be a complete list of foods and beverages you should avoid. Contact a dietitian for more information. Where to find more information  National Heart, Lung, and Blood Institute: PopSteam.is  American Heart Association: www.heart.org  Academy of Nutrition and Dietetics: www.eatright.org  National Kidney Foundation: www.kidney.org Summary  The DASH eating plan is a healthy eating plan that has been shown to reduce high blood pressure (hypertension). It may also reduce your risk for type 2 diabetes, heart disease, and stroke.  When on the DASH eating plan, aim to eat more fresh fruits and vegetables, whole grains, lean proteins, low-fat dairy, and heart-healthy fats.  With the DASH eating plan, you should limit salt (sodium) intake to 2,300 mg a day. If you have hypertension, you may need to reduce your sodium intake to 1,500 mg a day.  Work with your health care provider or dietitian to adjust your eating plan to your individual calorie needs. This information is not intended to replace advice given to you by your health care provider. Make sure you discuss any questions you have with your health care provider. Document Revised: 06/21/2019 Document Reviewed:  06/21/2019 Elsevier Patient Education  2021 Elsevier Inc.  Dental Sedation Sedation is the use of medicines to help someone feel relaxed and calm. Dental sedation is the use of sedation medicines during a dental checkup or procedure. There are four levels of sedation:  Minimal sedation. This is the lightest level of sedation. It allows you to be awake but relaxed.  Moderate sedation. Under this level of sedation, you are less alert than normal but are able to touch, respond to instructions, or both.  Deep sedation. In this level of sedation, you are in a deeper state of sleep. You cannot be easily aroused. You are  also not able to touch or respond to instructions, but you can still be awakened.  General anesthesia. This is the deepest level of sedation. You are completely asleep and unconscious during the procedure. Your dentist will select a type of sedation based on your overall health, your level of anxiety, and the type of procedure you will be having. You may also be given other medicines, such as pain medicine, anti-inflammatory medicines, or steroids. Tell your dentist about:  Any allergies you have, especially allergies to medicines.  All medicines you are taking, including vitamins, herbs, eye drops, creams, and over-the-counter medicines.  Any problems you or family members have had with anesthetic medicines.  Any blood disorders you have.  Any surgeries you have had.  Any medical conditions you have.  Whether you are pregnant or may be pregnant.  Whether you use tobacco, alcohol, marijuana, or drugs. What are the risks? Generally, this is a safe procedure. The risk of having a problem depends on the type of sedation used. Problems may include:  Nausea and vomiting.  Dizziness.  Respiratory depression. This is slow breathing caused by too little oxygen in the body. This condition is caused by too deep a sedation. If this happens, you will be given a mask or a  breathing tube to help with breathing until you are awake and breathing on your own.  Allergic reaction to medicines.  Infection. What happens before the procedure? Recommendations on eating and drinking are different for patients who have obesity, diabetes, smoking history, obstructive sleep apnea, or other conditions. These patients have increased risk of breathing difficulties. Staying hydrated Follow instructions from your dentist about hydration, which may include:  Up to 2 hours before the procedure - you may continue to drink clear liquids, such as water, clear fruit juice, black coffee, and plain tea.   Eating and drinking restrictions Follow instructions from your dentist about eating and drinking, which may include:  8 hours before the procedure - stop eating heavy meals or foods, such as meat, fried foods, or fatty foods.  6 hours before the procedure - stop eating light meals or foods, such as toast or cereal.  6 hours before the procedure - stop drinking milk, or drinks that contain milk.  2 hours before the procedure - stop drinking clear liquids. Medicines Ask your dentist about:  Changing or stopping your regular medicines. This is especially important if you are taking diabetes medicines or blood thinners.  Taking medicines such as aspirin and ibuprofen. These medicines can thin your blood. Do not take these medicines unless your dentist tells you to take them.  Taking over-the-counter medicines, vitamins, herbs, and supplements.  Taking antibiotic medicine to help prevent infection. General instructions  Do not use any products that contain nicotine or tobacco for at least 4 weeks before the procedure. These products include cigarettes, e-cigarettes, and chewing tobacco. If you need help quitting, ask your dentist.  Plan to have a responsible adult take you home from the dentist's office.  If you will be going home right after the procedure, plan to have a  responsible adult care for you for the time you are told. This is important. What happens during the procedure?  You will be given a sedation medicine. Depending on the type of sedation you are having, you may: ? Have a mask placed over your nose. The mask will be used to deliver inhaled sedation medicine. ? Have an IV inserted into one of your veins. The IV  will be used to deliver the sedative.  The dental procedure will be performed. You will be monitored throughout the procedure. ? The dentist will also have oxygen and medicines that reverse the effects of sedation available in case you need them.  The mask and IV will be removed after the dental procedure. The exact procedure may vary among dentists and clinics. What happens after the procedure?  Your blood pressure, heart rate, breathing rate, and blood oxygen level will be monitored until you leave the dentist's office or clinic.  You will feel sleepy.  You may have a headache or feel nauseous.  Do not drive or operate machinery until your dentist says that it is safe.   Summary  Dental sedation is the use of medicines to help someone feel relaxed and calm during a dental checkup or procedure.  This is a safe procedure, but some problems may occur, including nausea, slow breathing, and allergic reaction to medicines.  Follow your dentist's instructions about when to stop eating and drinking before the procedure.  Do not drive or operate machinery until your dentist says that it is safe. This information is not intended to replace advice given to you by your health care provider. Make sure you discuss any questions you have with your health care provider. Document Revised: 11/15/2019 Document Reviewed: 08/08/2019 Elsevier Patient Education  2021 [employer].

## 2020-12-23 LAB — LIPID PANEL
Chol/HDL Ratio: 2.1 ratio (ref 0.0–5.0)
Cholesterol, Total: 160 mg/dL (ref 100–199)
HDL: 78 mg/dL (ref >39)
LDL Chol Calc (NIH): 72 mg/dL (ref 0–99)
Triglycerides: 44 mg/dL (ref 0–149)
VLDL Cholesterol Cal: 10 mg/dL (ref 5–40)

## 2020-12-23 LAB — GLUCOSE, RANDOM: Glucose: 91 mg/dL (ref 65–99)

## 2021-08-25 ENCOUNTER — Other Ambulatory Visit: Payer: Self-pay | Admitting: Urology

## 2021-08-25 ENCOUNTER — Emergency Department: Payer: Managed Care, Other (non HMO)

## 2021-08-25 ENCOUNTER — Emergency Department
Admission: EM | Admit: 2021-08-25 | Discharge: 2021-08-25 | Disposition: A | Discharge status: Home / Self Care | Payer: Managed Care, Other (non HMO) | Attending: Emergency Medicine | Admitting: Emergency Medicine

## 2021-08-25 ENCOUNTER — Other Ambulatory Visit: Payer: Self-pay

## 2021-08-25 ENCOUNTER — Encounter: Payer: Self-pay | Admitting: Emergency Medicine

## 2021-08-25 DIAGNOSIS — R7989 Other specified abnormal findings of blood chemistry: Secondary | ICD-10-CM | POA: Insufficient documentation

## 2021-08-25 DIAGNOSIS — I10 Essential (primary) hypertension: Secondary | ICD-10-CM | POA: Diagnosis not present

## 2021-08-25 DIAGNOSIS — N132 Hydronephrosis with renal and ureteral calculous obstruction: Secondary | ICD-10-CM | POA: Insufficient documentation

## 2021-08-25 DIAGNOSIS — K7581 Nonalcoholic steatohepatitis (NASH): Secondary | ICD-10-CM | POA: Diagnosis not present

## 2021-08-25 DIAGNOSIS — N200 Calculus of kidney: Secondary | ICD-10-CM

## 2021-08-25 DIAGNOSIS — R109 Unspecified abdominal pain: Secondary | ICD-10-CM | POA: Diagnosis present

## 2021-08-25 HISTORY — DX: Gastro-esophageal reflux disease without esophagitis: K21.9

## 2021-08-25 HISTORY — DX: Essential (primary) hypertension: I10

## 2021-08-25 LAB — HEPATIC FUNCTION PANEL
ALT: 23 U/L (ref 0–44)
AST: 21 U/L (ref 15–41)
Albumin: 3.8 g/dL (ref 3.5–5.0)
Alkaline Phosphatase: 69 U/L (ref 38–126)
Bilirubin, Direct: <0.1 mg/dL (ref 0.0–0.2)
Total Bilirubin: 0.5 mg/dL (ref 0.3–1.2)
Total Protein: 7.2 g/dL (ref 6.5–8.1)

## 2021-08-25 LAB — CBC
HCT: 41.6 % (ref 39.0–52.0)
Hemoglobin: 12.6 g/dL — ABNORMAL LOW (ref 13.0–17.0)
MCH: 21.4 pg — ABNORMAL LOW (ref 26.0–34.0)
MCHC: 30.3 g/dL (ref 30.0–36.0)
MCV: 70.6 fL — ABNORMAL LOW (ref 80.0–100.0)
Platelets: 443 10*3/uL — ABNORMAL HIGH (ref 150–400)
RBC: 5.89 MIL/uL — ABNORMAL HIGH (ref 4.22–5.81)
RDW: 18 % — ABNORMAL HIGH (ref 11.5–15.5)
WBC: 10.4 10*3/uL (ref 4.0–10.5)
nRBC: 0 % (ref 0.0–0.2)

## 2021-08-25 LAB — URINALYSIS, MICROSCOPIC (REFLEX)
Bacteria, UA: NONE SEEN
RBC / HPF: >50 RBC/hpf (ref 0–5)

## 2021-08-25 LAB — URINALYSIS, ROUTINE W REFLEX MICROSCOPIC
Bilirubin Urine: NEGATIVE
Glucose, UA: NEGATIVE mg/dL
Ketones, ur: 15 mg/dL — AB
Leukocytes,Ua: NEGATIVE
Nitrite: NEGATIVE
Protein, ur: 30 mg/dL — AB
Specific Gravity, Urine: 1.025 (ref 1.005–1.030)
pH: 6 (ref 5.0–8.0)

## 2021-08-25 LAB — BASIC METABOLIC PANEL
Anion gap: 9 (ref 5–15)
BUN: 16 mg/dL (ref 6–20)
CO2: 26 mmol/L (ref 22–32)
Calcium: 9.2 mg/dL (ref 8.9–10.3)
Chloride: 102 mmol/L (ref 98–111)
Creatinine, Ser: 1 mg/dL (ref 0.61–1.24)
GFR, Estimated: >60 mL/min (ref >60)
Glucose, Bld: 120 mg/dL — ABNORMAL HIGH (ref 70–99)
Potassium: 3.4 mmol/L — ABNORMAL LOW (ref 3.5–5.1)
Sodium: 137 mmol/L (ref 135–145)

## 2021-08-25 LAB — LIPASE, BLOOD: Lipase: 29 U/L (ref 11–51)

## 2021-08-25 MED ORDER — HYDROMORPHONE HCL 1 MG/ML IJ SOLN
0.5000 mg | Freq: Once | INTRAMUSCULAR | Status: AC
Start: 2021-08-25 — End: 2021-08-25
  Administered 2021-08-25: 19:00:00 0.5 mg via INTRAVENOUS
  Filled 2021-08-25: qty 1

## 2021-08-25 MED ORDER — OXYCODONE HCL 5 MG PO TABS: 5.0000 mg | ORAL_TABLET | ORAL | 0 refills | Status: DC | PRN

## 2021-08-25 MED ORDER — ONDANSETRON HCL 4 MG/2ML IJ SOLN
4.0000 mg | Freq: Once | INTRAMUSCULAR | Status: AC
  Administered 2021-08-25: 18:00:00 4 mg via INTRAVENOUS
  Filled 2021-08-25: qty 2

## 2021-08-25 MED ORDER — OXYCODONE HCL 5 MG PO TABS
10.0000 mg | ORAL_TABLET | Freq: Once | ORAL | Status: AC
Start: 2021-08-25 — End: 2021-08-25
  Administered 2021-08-25: 19:00:00 10 mg via ORAL
  Filled 2021-08-25: qty 2

## 2021-08-25 MED ORDER — SODIUM CHLORIDE 0.9 % IV BOLUS
1000.0000 mL | Freq: Once | INTRAVENOUS | Status: AC
  Administered 2021-08-25: 18:00:00 1000 mL via INTRAVENOUS

## 2021-08-25 MED ORDER — ONDANSETRON HCL 4 MG PO TABS: 4.0000 mg | ORAL_TABLET | Freq: Every day | ORAL | 0 refills | Status: DC | PRN

## 2021-08-25 MED ORDER — HYDROMORPHONE HCL 1 MG/ML IJ SOLN
1.0000 mg | Freq: Once | INTRAMUSCULAR | Status: AC
  Administered 2021-08-25: 18:00:00 1 mg via INTRAVENOUS
  Filled 2021-08-25: qty 1

## 2021-08-25 MED ORDER — OXYCODONE HCL 5 MG PO CAPS: 5.0000 mg | ORAL_CAPSULE | ORAL | 0 refills | Status: DC | PRN

## 2021-08-25 MED ORDER — TAMSULOSIN HCL 0.4 MG PO CAPS: 0.4000 mg | ORAL_CAPSULE | Freq: Every day | ORAL | 0 refills | Status: AC

## 2021-08-25 NOTE — Progress Notes (Signed)
[facility] ORDER FORM  Expected date of procedure: 08/26/2021  Surgeon: Nickolas Madrid, MD  Post op standing: 2-4wk follow up w/KUB prior  Anticoagulation/Aspirin/NSAID standing order: Hold all 72 hours prior  Anesthesia standing order: MAC  VTE standing: SCD's  Dx: Right Nephrolihtiasis  Procedure: right Extracorporeal shock wave lithotripsy  CPT : 18841  Standing Order Set:   *NPO after mn, KUB  *NS 180m/hr, Keflex 5069mPO, Benadryl 2568mO, Valium 84m89m, Zofran 4mg 25m   Medications if other than standing orders:   NONE

## 2021-08-25 NOTE — Progress Notes (Signed)
° ° °08/26/2021 °8:46 AM  ° °[name] °06/21/1971 °[id number] ° °Referring provider: Olmedo, Mario Ernesto, MD °[address] [place] [address] °[place],  [place] [address] ° °Chief Complaint  °Patient presents with  ° Follow-up  ° °Urological history: °1. High risk hematuria °-non-smoker °-CTU 2017 - nephrolithiasis and multiple renal cysts °-UA > 50 RBC's ° °2.  Nephrolithiasis °-Stone composition of 75% calcium oxalate dihydrate, 10% calcium oxalate monohydrate and 50% calcium phosphate °-Bilateral URS 2017 °-Left URS 2017 ° °3. Renal cysts °-CTU 2017 - Bosniak I and II bilaterally ° °4. PSA screening °-PSA 2.00 08/2021  ° °HPI: °[name] is a 50 y.o. male who presents today for ED follow up.  ° °He presented to the emergency room on August 25, 2021 with sudden onset of right-sided flank pain associated with nausea and decreased urination.  CT renal stone study noted an obstructing 11 x 13 mm stone at the right UPJ.  SD <1500 HU and SSD < 15 cm °  °Labs in the ED: Serum creatinine 1.0, WBC count of 10.4 and urinalysis positive for greater than 50 WBCs and some budding yeast. °  °Meds given in the ED: Oxycodone, tamsulosin and Zofran ° °Current NSAID/anticoagulation:   NONE -he took his last aspirin a week ago °  °Today, he had some nausea and pain and took a nausea medicine and a pain medicine around 1 AM this morning with sip of water.  He currently is having right-sided flank pain.  10/10 pain.   ° °Patient denies any modifying or aggravating factors.  Patient denies any gross hematuria, dysuria or suprapubic.  Patient denies any fevers, chills, nausea or vomiting.   ° °KUB 1 cm right UPJ stone.    ° ° °PMH: °Past Medical History:  °Diagnosis Date  ° Acid reflux   ° GERD (gastroesophageal reflux disease)   ° Hematuria   ° Hypertension   ° Kidney stones   ° Sleep apnea   ° HAD GASTRIC SLEEVE AND HAS COME OFF OF CPAP  ° Urolithiasis   ° ° °Surgical History: °Past Surgical History:  °Procedure Laterality Date  °  CYSTOSCOPY W/ RETROGRADES Left 08/08/2016  ° Procedure: CYSTOSCOPY WITH RETROGRADE PYELOGRAM;  Surgeon: Ashley Brandon, MD;  Location: [facility];  Service: Urology;  Laterality: Left;  ° CYSTOSCOPY W/ RETROGRADES Bilateral 03/05/2016  ° Procedure: CYSTOSCOPY WITH RETROGRADE PYELOGRAM;  Surgeon: Brian James Budzyn, MD;  Location: [facility];  Service: Urology;  Laterality: Bilateral;  ° CYSTOSCOPY W/ URETERAL STENT PLACEMENT Bilateral 03/29/2016  ° Procedure: CYSTOSCOPY WITH STENT REPLACEMENT;  Surgeon: Ashley Brandon, MD;  Location: [facility];  Service: Urology;  Laterality: Bilateral;  ° CYSTOSCOPY W/ URETERAL STENT REMOVAL Left 08/08/2016  ° Procedure: CYSTOSCOPY WITH STENT REMOVAL;  Surgeon: Ashley Brandon, MD;  Location: [facility];  Service: Urology;  Laterality: Left;  ° CYSTOSCOPY WITH STENT PLACEMENT Left 07/18/2016  ° Procedure: CYSTOSCOPY WITH STENT PLACEMENT;  Surgeon: Ashley Brandon, MD;  Location: [facility];  Service: Urology;  Laterality: Left;  ° CYSTOSCOPY WITH STENT PLACEMENT Bilateral 03/05/2016  ° Procedure: CYSTOSCOPY WITH STENT PLACEMENT;  Surgeon: Brian James Budzyn, MD;  Location: [facility];  Service: Urology;  Laterality: Bilateral;  ° CYSTOSCOPY/RETROGRADE/URETEROSCOPY Left 07/18/2016  ° Procedure: CYSTOSCOPY/RETROGRADE/URETEROSCOPY;  Surgeon: Ashley Brandon, MD;  Location: [facility];  Service: Urology;  Laterality: Left;  ° EXTRACORPOREAL SHOCK WAVE LITHOTRIPSY Right 03/03/2016  ° Procedure: EXTRACORPOREAL SHOCK WAVE LITHOTRIPSY (ESWL);  Surgeon: Ashley Brandon, MD;  Location: [facility];  Service: Urology;    Laterality: Right;   KNEE SURGERY     LAPAROSCOPIC GASTRIC SLEEVE RESECTION  2014   SEPTOPLASTY Bilateral 06/07/2018   Procedure: SEPTOPLASTY;  Surgeon: Margaretha Sheffield, MD;  Location: [facility];  Service: ENT;  Laterality: Bilateral;   STONE EXTRACTION WITH BASKET Left 07/18/2016   Procedure: STONE EXTRACTION WITH BASKET;  Surgeon: Hollice Espy, MD;  Location: [facility];  Service: Urology;   Laterality: Left;   TURBINATE REDUCTION Bilateral 06/07/2018   Procedure: INFERIOR TURBINATE REDUCTION;  Surgeon: Margaretha Sheffield, MD;  Location: [facility];  Service: ENT;  Laterality: Bilateral;   URETEROSCOPY Left 03/29/2016   Procedure: URETEROSCOPY;  Surgeon: Hollice Espy, MD;  Location: [facility];  Service: Urology;  Laterality: Left;   URETEROSCOPY WITH HOLMIUM LASER LITHOTRIPSY Right 03/29/2016   Procedure: URETEROSCOPY WITH HOLMIUM LASER LITHOTRIPSY;  Surgeon: Hollice Espy, MD;  Location: [facility];  Service: Urology;  Laterality: Right;    Home Medications:  Allergies as of 08/26/2021       Reactions   Penicillins Itching, Rash   Has patient had a PCN reaction causing immediate rash, facial/tongue/throat swelling, SOB or lightheadedness with hypotension: No Has patient had a PCN reaction causing severe rash involving mucus membranes or skin necrosis: No Has patient had a PCN reaction that required hospitalization No Has patient had a PCN reaction occurring within the last 10 years: No If all of the above answers are "NO", then may proceed with Cephalosporin use.        Medication List        Accurate as of August 26, 2021  8:46 AM. If you have any questions, ask your nurse or doctor.          cephALEXin 500 MG capsule Commonly known as: KEFLEX Take 500 mg by mouth 3 (three) times daily.   omeprazole 20 MG capsule Commonly known as: PRILOSEC Take 20 mg by mouth at bedtime.   ondansetron 4 MG tablet Commonly known as: Zofran Take 1 tablet (4 mg total) by mouth daily as needed for nausea or vomiting.   oxycodone 5 MG capsule Commonly known as: OXY-IR Take 1 capsule (5 mg total) by mouth every 4 (four) hours as needed.   tamsulosin 0.4 MG Caps capsule Commonly known as: FLOMAX Take 1 capsule (0.4 mg total) by mouth daily for 14 days.        Allergies:  Allergies  Allergen Reactions   Penicillins Itching and Rash    Has patient had a PCN  reaction causing immediate rash, facial/tongue/throat swelling, SOB or lightheadedness with hypotension: No Has patient had a PCN reaction causing severe rash involving mucus membranes or skin necrosis: No Has patient had a PCN reaction that required hospitalization No Has patient had a PCN reaction occurring within the last 10 years: No If all of the above answers are "NO", then may proceed with Cephalosporin use.    Family History: Family History  Problem Relation Age of Onset   Prostate cancer Father    Kidney disease Mother    Kidney cancer Neg Hx    Bladder Cancer Neg Hx     Social History:  reports that he has never smoked. He has never used smokeless tobacco. He reports current alcohol use. He reports that he does not use drugs.  ROS: Pertinent ROS in HPI  Physical Exam: BP (!) 152/116    Pulse (!) 111    Ht 5\' 6"  (1.676 m)    Wt 238 lb (108 kg)  BMI 38.41 kg/m   Constitutional:  Well nourished. Alert and oriented, No acute distress. HEENT: [place] AT, mask in place.  Trachea midline Cardiovascular: No clubbing, cyanosis, or edema. Respiratory: Normal respiratory effort, no increased work of breathing. Neurologic: Grossly intact, no focal deficits, moving all 4 extremities. Psychiatric: Normal mood and affect.  Laboratory Data: Lab Results  Component Value Date   WBC 10.4 08/25/2021   HGB 12.6 (L) 08/25/2021   HCT 41.6 08/25/2021   MCV 70.6 (L) 08/25/2021   PLT 443 (H) 08/25/2021    Lab Results  Component Value Date   CREATININE 1.00 08/25/2021    Lab Results  Component Value Date   HGBA1C 5.7 04/11/2013    Lab Results  Component Value Date   TSH 1.40 04/11/2013       Component Value Date/Time   CHOL 160 12/22/2020 1640   HDL 78 12/22/2020 1640   CHOLHDL 2.1 12/22/2020 1640   LDLCALC 72 12/22/2020 1640    Lab Results  Component Value Date   AST 21 08/25/2021   Lab Results  Component Value Date   ALT 23 08/25/2021    Urinalysis     Component Value Date/Time   COLORURINE YELLOW 08/25/2021 1713   APPEARANCEUR CLEAR 08/25/2021 1713   APPEARANCEUR Clear 07/17/2017 1100   LABSPEC 1.025 08/25/2021 1713   LABSPEC 1.027 05/23/2013 1933   PHURINE 6.0 08/25/2021 1713   GLUCOSEU NEGATIVE 08/25/2021 1713   GLUCOSEU Negative 05/23/2013 1933   HGBUR LARGE (A) 08/25/2021 1713   BILIRUBINUR NEGATIVE 08/25/2021 1713   BILIRUBINUR Negative 07/17/2017 1100   BILIRUBINUR Negative 05/23/2013 1933   KETONESUR 15 (A) 08/25/2021 1713   PROTEINUR 30 (A) 08/25/2021 1713   NITRITE NEGATIVE 08/25/2021 1713   LEUKOCYTESUR NEGATIVE 08/25/2021 1713   LEUKOCYTESUR Negative 05/23/2013 1933    I have reviewed the labs.   Pertinent Imaging: CLINICAL DATA:  Flank pain, kidney stone suspected   Patient reports right flank pain, onset last night, progressive. History of kidney stones.   EXAM: CT ABDOMEN AND PELVIS WITHOUT CONTRAST   TECHNIQUE: Multidetector CT imaging of the abdomen and pelvis was performed following the standard protocol without IV contrast.   RADIATION DOSE REDUCTION: This exam was performed according to the departmental dose-optimization program which includes automated exposure control, adjustment of the mA and/or kV according to patient size and/or use of iterative reconstruction technique.   COMPARISON:  CT 06/27/2016   FINDINGS: Lower chest: Hypoventilatory changes in the lung bases. Small to moderate-sized hiatal hernia. Gastric suture line which crosses the diaphragmatic hiatus. Calcified granuloma in the right lower lobe.   Hepatobiliary: Mild hepatic steatosis. No focal liver lesion on this unenhanced exam. Clips in the gallbladder fossa postcholecystectomy. No biliary dilatation.   Pancreas: No ductal dilatation or inflammation.   Spleen: Normal in size without focal abnormality.   Adrenals/Urinary Tract: Normal adrenal glands. Obstructing 11 x 13 mm stone at the right ureteropelvic junction  with mild right hydronephrosis. Mild right perinephric edema. The right ureter is otherwise decompressed without distal ureteral stone. There is a punctate nonobstructing stone in the upper right kidney. Small cortical cyst in the lower right kidney.   Extrarenal pelvis configuration versus parapelvic cyst in the left kidney. There is no left hydronephrosis or left renal calculi. Decompressed left ureter. Multiple cysts in the lower left kidney, incompletely characterized on this unenhanced exam. The urinary bladder is near completely empty. No bladder stone.   Stomach/Bowel: Gastric sleeve with gastric suture line traversing the  diaphragmatic hiatus, small to moderate hiatal hernia. No small bowel obstruction or inflammatory change. Diminutive appendix tentatively visualized small volume of colonic stool.   Vascular/Lymphatic: Normal caliber abdominal aorta. Minimal aortic atherosclerosis. Circumaortic left renal vein. No portal venous or mesenteric gas. No bulky abdominopelvic lymph nodes.   Reproductive: Prostate is unremarkable.   Other: No free air, free fluid, or intra-abdominal fluid collection. Tiny fat containing umbilical hernia.   Musculoskeletal: Stable hemangioma within L3 vertebral body. Lower lumbar facet hypertrophy. There are no acute or suspicious osseous abnormalities.   IMPRESSION: 1. Obstructing 11 x 13 mm stone at the right ureteropelvic junction with mild right hydronephrosis. 2. Punctate nonobstructing stone in the upper right kidney. 3. Mild hepatic steatosis. 4. Small to moderate hiatal hernia. 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   On: 08/25/2021 18:06  Stone visible on KUB I have independently reviewed the films.  See HPI.  Assessment & Plan:    1. Right UPJ stone -As litho truck is available today and patient has had prior ESWL's in the past,  he would like to pursue right ESWL for definitive treatment of the stone -Explained to the patient that with ESWL, shock waves are focused on the stone using X-rays to pinpoint the stone.  The shock waves are fired repeatedly which usually causes the stone to break into small pieces which pass out in the urine over the next few weeks.  They will go home that day and may be able to resume normal activities in three days.  They should be given a strainer and they need to collect any fragments/sediments that they pass for analysis.   Risks involved with the procedure consist of bruising of the skin and kidney, possible long-term kidney damage, development of HTN, damage to the bowel, spleen, liver, pancreas, male organs or lungs, hematuria, hematuria serious enough to require transfusion or surgical repair, formation of a hematoma, infection or sepsis.  If the stone is too large or dense, it may not break apart or break apart in large pieces and cause "Steinstrasse."  If this happens, it would result in the need for another procedure (likely URS/LL/stent placement).  IV sedation is typically used, but in rare instances general anesthesia is used with the risk of irregular heart beat, irregular BP, stroke, MI, CVA, paralysis, coma and/or death.    2. Microscopic hematuria -We will ensure resolution at future visits once definitive stone treatment is achieved  Return for right ESWL .  These notes generated with voice recognition software. I apologize for typographical errors.  Zara Council, PA-C  [facility] [address]  [place], [place] [address] [phone]

## 2021-08-25 NOTE — ED Provider Notes (Signed)
[facility] Provider Note    Event Date/Time   First MD Initiated Contact with Patient 08/25/21 1723     (approximate)   History   Flank Pain   HPI  [name] is a 51 y.o. male with prior kidney stones, hypertension who comes in with concern for kidney stone.  Patient reports having sudden onset right flank pain on Sunday.'s been intermittent nature but today its been more constant and not going away.  He reports urinating less with some associated nausea.  States it feels similar to his prior kidney stones.  Reports having to have stents and lithotripsy in the past.  Denies any fevers   Physical Exam   Triage Vital Signs: ED Triage Vitals  Enc Vitals Group     BP 08/25/21 1712 (!) 165/116     Pulse Rate 08/25/21 1712 92     Resp 08/25/21 1712 19     Temp 08/25/21 1712 98.6 F (37 C)     Temp Source 08/25/21 1712 Oral     SpO2 08/25/21 1712 95 %     Weight 08/25/21 1711 238 lb 15.7 oz (108.4 kg)     Height 08/25/21 1711 5\' 6"  (1.676 m)     Head Circumference --      Peak Flow --      Pain Score 08/25/21 1710 7     Pain Loc --      Pain Edu? --      Excl. in GC? --     Most recent vital signs: Vitals:   08/25/21 1712  BP: (!) 165/116  Pulse: 92  Resp: 19  Temp: 98.6 F (37 C)  SpO2: 95%     General: Awake, appears slightly uncomfortable CV:  Good peripheral perfusion.  Resp:  Normal effort.  Abd:  No distention.  Soft and nontender Other:  Right flank pain   ED Results / Procedures / Treatments   Labs (all labs ordered are listed, but only abnormal results are displayed) Labs Reviewed  CBC - Abnormal; Notable for the following components:      Result Value   RBC 5.89 (*)    Hemoglobin 12.6 (*)    MCV 70.6 (*)    MCH 21.4 (*)    RDW 18.0 (*)    Platelets 443 (*)    All other components within normal limits  URINALYSIS, ROUTINE W REFLEX MICROSCOPIC  BASIC METABOLIC PANEL  HEPATIC FUNCTION PANEL  LIPASE, BLOOD       RADIOLOGY I have reviewed the xray personally and agree with radiology read large kidney stone Right UPJ   PROCEDURES:  Critical Care performed: No  Procedures   MEDICATIONS ORDERED IN ED: Medications  HYDROmorphone (DILAUDID) injection 1 mg (has no administration in time range)  ondansetron (ZOFRAN) injection 4 mg (has no administration in time range)  sodium chloride 0.9 % bolus 1,000 mL (has no administration in time range)     IMPRESSION / MDM / ASSESSMENT AND PLAN / ED COURSE  I reviewed the triage vital signs and the nursing notes.   Differential diagnosis includes, but is not limited to, most likely kidney stone.  Lower suspicion for SBO, appendicitis, perforation.  We will get CT imaging to further evaluate.  Patient requiring some IV Dilaudid IV Zofran and IV fluids  UA with out evidence of UTI but does have significant amount of RBCs BMP shows slightly low potassium we will get some oral repletion CBC with normal white  count no signs of infection.  Hemoglobin is baseline  CT scan with large right sided kidney stone.  He does have some mild hepatic steatosis so we will add on some LFTs.  I did discuss the incidental findings on the CT and gave him a copy of the report  Discussed the case with Dr. Richardo Hanks who is okay with patient being discharged home and they will see him in clinic tomorrow at 8 AM.  Recommends to be n.p.o. for possible procedure tomorrow.  No aspirin or NSAIDs  I considered admission for patient given how large the stone was but patient is tolerating p.o., pain is well controlled at this time.  Reevaluate patient and pain is well controlled.  He is comfortable with this plan and will go home with some oxycodone, flomax, zofran and f/u urology tomorrow.   LFTS pending and pt will f/u mychart     FINAL CLINICAL IMPRESSION(S) / ED DIAGNOSES   Final diagnoses:  Kidney stone     Rx / DC Orders   ED Discharge Orders          Ordered     oxyCODONE (ROXICODONE) 5 MG immediate release tablet  Every 4 hours PRN        08/25/21 1936    ondansetron (ZOFRAN) 4 MG tablet  Daily PRN        08/25/21 1936    tamsulosin (FLOMAX) 0.4 MG CAPS capsule  Daily        08/25/21 1936             Note:  This document was prepared using Dragon voice recognition software and may include unintentional dictation errors.   Concha Se, MD 08/25/21 [phone]

## 2021-08-25 NOTE — ED Triage Notes (Signed)
Pt comes into the ED via POV c/o right flank pain that started a week ago.  Pt states that last night it got worse.  Pt has a h/o kidney stones.  Pt states he is also having decreased urine output.  Pt ambulatory to triage and in NAD.

## 2021-08-25 NOTE — Discharge Instructions (Addendum)
IMPRESSION: You have a kidney stone. See report below.   NO ASPIRIN OR IBUPROFEN Take tylenol 1g every 8 hours daily. Take oxycodone for breakthrough pain. Do not drive, work, or operate machinery while on this.  Take zofran to help with nausea. Take Flomax to help dilate uretha. Go to urology office at [place].  DO NOT EAT ANYTHING AFTER midnight.  Return to ED for fevers, unable to keep food down, or any other concerns.   Liver function test pending and follow up in mychart.    1. Obstructing 11 x 13 mm stone at the right ureteropelvic junction with mild right hydronephrosis. 2. Punctate nonobstructing stone in the upper right kidney. 3. Mild hepatic steatosis. 4. Small to moderate hiatal hernia. Prior gastric sleeve with gastric suture line crossing the diaphragmatic hiatus  Take oxycodone as prescribed. Do not drink alcohol, drive or participate in any other potentially dangerous activities while taking this medication as it may make you sleepy. Do not take this medication with any other sedating medications, either prescription or over-the-counter. If you were prescribed Percocet or Vicodin, do not take these with acetaminophen (Tylenol) as it is already contained within these medications.  This medication is an opiate (or narcotic) pain medication and can be habit forming. Use it as little as possible to achieve adequate pain control. Do not use or use it with extreme caution if you have a history of opiate abuse or dependence. If you are on a pain contract with your primary care doctor or a pain specialist, be sure to let them know you were prescribed this medication today from the Emergency Department. This medication is intended for your use only - do not give any to anyone else and keep it in a secure place where nobody else, especially children, have access to it.

## 2021-08-25 NOTE — H&P (View-Only) (Signed)
08/26/2021 8:46 AM   [name] [birth year] [id number]  Referring provider: Dione Housekeeper, MD [address] [place],  [place] [address]  Chief Complaint  Patient presents with   Follow-up   Urological history: 1. High risk hematuria -non-smoker -CTU 2017 - nephrolithiasis and multiple renal cysts -UA > 50 RBC's  2.  Nephrolithiasis -Stone composition of 75% calcium oxalate dihydrate, 10% calcium oxalate monohydrate and 50% calcium phosphate -Bilateral URS 2017 -Left URS 2017  3. Renal cysts -CTU 2017 - Bosniak I and II bilaterally  4. PSA screening -PSA 2.00 08/2021   HPI: [name] is a 51 y.o. male who presents today for ED follow up.   He presented to the emergency room on August 25, 2021 with sudden onset of right-sided flank pain associated with nausea and decreased urination.  CT renal stone study noted an obstructing 11 x 13 mm stone at the right UPJ.  SD <1500 HU and SSD < 15 cm   Labs in the ED: Serum creatinine 1.0, WBC count of 10.4 and urinalysis positive for greater than 50 WBCs and some budding yeast.   Meds given in the ED: Oxycodone, tamsulosin and Zofran  Current NSAID/anticoagulation:   NONE -he took his last aspirin a week ago   Today, he had some nausea and pain and took a nausea medicine and a pain medicine around 1 AM this morning with sip of water.  He currently is having right-sided flank pain.  10/10 pain.    Patient denies any modifying or aggravating factors.  Patient denies any gross hematuria, dysuria or suprapubic.  Patient denies any fevers, chills, nausea or vomiting.    KUB 1 cm right UPJ stone.      PMH: Past Medical History:  Diagnosis Date   Acid reflux    GERD (gastroesophageal reflux disease)    Hematuria    Hypertension    Kidney stones    Sleep apnea    HAD GASTRIC SLEEVE AND HAS COME OFF OF CPAP   Urolithiasis     Surgical History: Past Surgical History:  Procedure Laterality Date    CYSTOSCOPY W/ RETROGRADES Left 08/08/2016   Procedure: CYSTOSCOPY WITH RETROGRADE PYELOGRAM;  Surgeon: Vanna Scotland, MD;  Location: [facility];  Service: Urology;  Laterality: Left;   CYSTOSCOPY W/ RETROGRADES Bilateral 03/05/2016   Procedure: CYSTOSCOPY WITH RETROGRADE PYELOGRAM;  Surgeon: Hildred Laser, MD;  Location: [facility];  Service: Urology;  Laterality: Bilateral;   CYSTOSCOPY W/ URETERAL STENT PLACEMENT Bilateral 03/29/2016   Procedure: CYSTOSCOPY WITH STENT REPLACEMENT;  Surgeon: Vanna Scotland, MD;  Location: [facility];  Service: Urology;  Laterality: Bilateral;   CYSTOSCOPY W/ URETERAL STENT REMOVAL Left 08/08/2016   Procedure: CYSTOSCOPY WITH STENT REMOVAL;  Surgeon: Vanna Scotland, MD;  Location: [facility];  Service: Urology;  Laterality: Left;   CYSTOSCOPY WITH STENT PLACEMENT Left 07/18/2016   Procedure: CYSTOSCOPY WITH STENT PLACEMENT;  Surgeon: Vanna Scotland, MD;  Location: [facility];  Service: Urology;  Laterality: Left;   CYSTOSCOPY WITH STENT PLACEMENT Bilateral 03/05/2016   Procedure: CYSTOSCOPY WITH STENT PLACEMENT;  Surgeon: Hildred Laser, MD;  Location: [facility];  Service: Urology;  Laterality: Bilateral;   CYSTOSCOPY/RETROGRADE/URETEROSCOPY Left 07/18/2016   Procedure: CYSTOSCOPY/RETROGRADE/URETEROSCOPY;  Surgeon: Vanna Scotland, MD;  Location: [facility];  Service: Urology;  Laterality: Left;   EXTRACORPOREAL SHOCK WAVE LITHOTRIPSY Right 03/03/2016   Procedure: EXTRACORPOREAL SHOCK WAVE LITHOTRIPSY (ESWL);  Surgeon: Vanna Scotland, MD;  Location: [facility];  Service: Urology;  Laterality: Right;   KNEE SURGERY     LAPAROSCOPIC GASTRIC SLEEVE RESECTION  2014   SEPTOPLASTY Bilateral 06/07/2018   Procedure: SEPTOPLASTY;  Surgeon: Margaretha Sheffield, MD;  Location: [facility];  Service: ENT;  Laterality: Bilateral;   STONE EXTRACTION WITH BASKET Left 07/18/2016   Procedure: STONE EXTRACTION WITH BASKET;  Surgeon: Hollice Espy, MD;  Location: [facility];  Service: Urology;   Laterality: Left;   TURBINATE REDUCTION Bilateral 06/07/2018   Procedure: INFERIOR TURBINATE REDUCTION;  Surgeon: Margaretha Sheffield, MD;  Location: [facility];  Service: ENT;  Laterality: Bilateral;   URETEROSCOPY Left 03/29/2016   Procedure: URETEROSCOPY;  Surgeon: Hollice Espy, MD;  Location: [facility];  Service: Urology;  Laterality: Left;   URETEROSCOPY WITH HOLMIUM LASER LITHOTRIPSY Right 03/29/2016   Procedure: URETEROSCOPY WITH HOLMIUM LASER LITHOTRIPSY;  Surgeon: Hollice Espy, MD;  Location: [facility];  Service: Urology;  Laterality: Right;    Home Medications:  Allergies as of 08/26/2021       Reactions   Penicillins Itching, Rash   Has patient had a PCN reaction causing immediate rash, facial/tongue/throat swelling, SOB or lightheadedness with hypotension: No Has patient had a PCN reaction causing severe rash involving mucus membranes or skin necrosis: No Has patient had a PCN reaction that required hospitalization No Has patient had a PCN reaction occurring within the last 10 years: No If all of the above answers are "NO", then may proceed with Cephalosporin use.        Medication List        Accurate as of August 26, 2021  8:46 AM. If you have any questions, ask your nurse or doctor.          cephALEXin 500 MG capsule Commonly known as: KEFLEX Take 500 mg by mouth 3 (three) times daily.   omeprazole 20 MG capsule Commonly known as: PRILOSEC Take 20 mg by mouth at bedtime.   ondansetron 4 MG tablet Commonly known as: Zofran Take 1 tablet (4 mg total) by mouth daily as needed for nausea or vomiting.   oxycodone 5 MG capsule Commonly known as: OXY-IR Take 1 capsule (5 mg total) by mouth every 4 (four) hours as needed.   tamsulosin 0.4 MG Caps capsule Commonly known as: FLOMAX Take 1 capsule (0.4 mg total) by mouth daily for 14 days.        Allergies:  Allergies  Allergen Reactions   Penicillins Itching and Rash    Has patient had a PCN  reaction causing immediate rash, facial/tongue/throat swelling, SOB or lightheadedness with hypotension: No Has patient had a PCN reaction causing severe rash involving mucus membranes or skin necrosis: No Has patient had a PCN reaction that required hospitalization No Has patient had a PCN reaction occurring within the last 10 years: No If all of the above answers are "NO", then may proceed with Cephalosporin use.    Family History: Family History  Problem Relation Age of Onset   Prostate cancer Father    Kidney disease Mother    Kidney cancer Neg Hx    Bladder Cancer Neg Hx     Social History:  reports that he has never smoked. He has never used smokeless tobacco. He reports current alcohol use. He reports that he does not use drugs.  ROS: Pertinent ROS in HPI  Physical Exam: BP (!) 152/116    Pulse (!) 111    Ht 5\' 6"  (1.676 m)    Wt 238 lb (108 kg)  BMI 38.41 kg/m   Constitutional:  Well nourished. Alert and oriented, No acute distress. HEENT: [place] AT, mask in place.  Trachea midline Cardiovascular: No clubbing, cyanosis, or edema. Respiratory: Normal respiratory effort, no increased work of breathing. Neurologic: Grossly intact, no focal deficits, moving all 4 extremities. Psychiatric: Normal mood and affect.  Laboratory Data: Lab Results  Component Value Date   WBC 10.4 08/25/2021   HGB 12.6 (L) 08/25/2021   HCT 41.6 08/25/2021   MCV 70.6 (L) 08/25/2021   PLT 443 (H) 08/25/2021    Lab Results  Component Value Date   CREATININE 1.00 08/25/2021    Lab Results  Component Value Date   HGBA1C 5.7 04/11/2013    Lab Results  Component Value Date   TSH 1.40 04/11/2013       Component Value Date/Time   CHOL 160 12/22/2020 1640   HDL 78 12/22/2020 1640   CHOLHDL 2.1 12/22/2020 1640   LDLCALC 72 12/22/2020 1640    Lab Results  Component Value Date   AST 21 08/25/2021   Lab Results  Component Value Date   ALT 23 08/25/2021    Urinalysis     Component Value Date/Time   COLORURINE YELLOW 08/25/2021 1713   APPEARANCEUR CLEAR 08/25/2021 1713   APPEARANCEUR Clear 07/17/2017 1100   LABSPEC 1.025 08/25/2021 1713   LABSPEC 1.027 05/23/2013 1933   PHURINE 6.0 08/25/2021 1713   GLUCOSEU NEGATIVE 08/25/2021 1713   GLUCOSEU Negative 05/23/2013 1933   HGBUR LARGE (A) 08/25/2021 1713   BILIRUBINUR NEGATIVE 08/25/2021 1713   BILIRUBINUR Negative 07/17/2017 1100   BILIRUBINUR Negative 05/23/2013 1933   KETONESUR 15 (A) 08/25/2021 1713   PROTEINUR 30 (A) 08/25/2021 1713   NITRITE NEGATIVE 08/25/2021 1713   LEUKOCYTESUR NEGATIVE 08/25/2021 1713   LEUKOCYTESUR Negative 05/23/2013 1933    I have reviewed the labs.   Pertinent Imaging: CLINICAL DATA:  Flank pain, kidney stone suspected   Patient reports right flank pain, onset last night, progressive. History of kidney stones.   EXAM: CT ABDOMEN AND PELVIS WITHOUT CONTRAST   TECHNIQUE: Multidetector CT imaging of the abdomen and pelvis was performed following the standard protocol without IV contrast.   RADIATION DOSE REDUCTION: This exam was performed according to the departmental dose-optimization program which includes automated exposure control, adjustment of the mA and/or kV according to patient size and/or use of iterative reconstruction technique.   COMPARISON:  CT 06/27/2016   FINDINGS: Lower chest: Hypoventilatory changes in the lung bases. Small to moderate-sized hiatal hernia. Gastric suture line which crosses the diaphragmatic hiatus. Calcified granuloma in the right lower lobe.   Hepatobiliary: Mild hepatic steatosis. No focal liver lesion on this unenhanced exam. Clips in the gallbladder fossa postcholecystectomy. No biliary dilatation.   Pancreas: No ductal dilatation or inflammation.   Spleen: Normal in size without focal abnormality.   Adrenals/Urinary Tract: Normal adrenal glands. Obstructing 11 x 13 mm stone at the right ureteropelvic junction  with mild right hydronephrosis. Mild right perinephric edema. The right ureter is otherwise decompressed without distal ureteral stone. There is a punctate nonobstructing stone in the upper right kidney. Small cortical cyst in the lower right kidney.   Extrarenal pelvis configuration versus parapelvic cyst in the left kidney. There is no left hydronephrosis or left renal calculi. Decompressed left ureter. Multiple cysts in the lower left kidney, incompletely characterized on this unenhanced exam. The urinary bladder is near completely empty. No bladder stone.   Stomach/Bowel: Gastric sleeve with gastric suture line traversing the  diaphragmatic hiatus, small to moderate hiatal hernia. No small bowel obstruction or inflammatory change. Diminutive appendix tentatively visualized small volume of colonic stool.   Vascular/Lymphatic: Normal caliber abdominal aorta. Minimal aortic atherosclerosis. Circumaortic left renal vein. No portal venous or mesenteric gas. No bulky abdominopelvic lymph nodes.   Reproductive: Prostate is unremarkable.   Other: No free air, free fluid, or intra-abdominal fluid collection. Tiny fat containing umbilical hernia.   Musculoskeletal: Stable hemangioma within L3 vertebral body. Lower lumbar facet hypertrophy. There are no acute or suspicious osseous abnormalities.   IMPRESSION: 1. Obstructing 11 x 13 mm stone at the right ureteropelvic junction with mild right hydronephrosis. 2. Punctate nonobstructing stone in the upper right kidney. 3. Mild hepatic steatosis. 4. Small to moderate hiatal hernia. 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   On: 08/25/2021 18:06  Stone visible on KUB I have independently reviewed the films.  See HPI.  Assessment & Plan:    1. Right UPJ stone -As litho truck is available today and patient has had prior ESWL's in the past,  he would like to pursue right ESWL for definitive treatment of the stone -Explained to the patient that with ESWL, shock waves are focused on the stone using X-rays to pinpoint the stone.  The shock waves are fired repeatedly which usually causes the stone to break into small pieces which pass out in the urine over the next few weeks.  They will go home that day and may be able to resume normal activities in three days.  They should be given a strainer and they need to collect any fragments/sediments that they pass for analysis.   Risks involved with the procedure consist of bruising of the skin and kidney, possible long-term kidney damage, development of HTN, damage to the bowel, spleen, liver, pancreas, male organs or lungs, hematuria, hematuria serious enough to require transfusion or surgical repair, formation of a hematoma, infection or sepsis.  If the stone is too large or dense, it may not break apart or break apart in large pieces and cause "Steinstrasse."  If this happens, it would result in the need for another procedure (likely URS/LL/stent placement).  IV sedation is typically used, but in rare instances general anesthesia is used with the risk of irregular heart beat, irregular BP, stroke, MI, CVA, paralysis, coma and/or death.    2. Microscopic hematuria -We will ensure resolution at future visits once definitive stone treatment is achieved  Return for right ESWL .  These notes generated with voice recognition software. I apologize for typographical errors.  Zara Council, PA-C  [facility] [address]  [place], [place] [address] [phone]

## 2021-08-25 NOTE — ED Notes (Signed)
Pt back from CT at this time 

## 2021-08-25 NOTE — ED Notes (Signed)
Pt to CT at this time.

## 2021-08-26 ENCOUNTER — Other Ambulatory Visit: Payer: Self-pay

## 2021-08-26 ENCOUNTER — Ambulatory Visit
Admission: RE | Admit: 2021-08-26 | Discharge: 2021-08-26 | Disposition: A | Discharge status: Home / Self Care | Payer: Managed Care, Other (non HMO) | Source: Home / Self Care | Attending: Urology | Admitting: Urology

## 2021-08-26 ENCOUNTER — Encounter: Payer: Self-pay | Admitting: Urology

## 2021-08-26 ENCOUNTER — Encounter
Admission: RE | Disposition: A | Discharge status: Home / Self Care | Payer: Self-pay | Source: Ambulatory Visit | Attending: Urology

## 2021-08-26 ENCOUNTER — Other Ambulatory Visit: Payer: Self-pay | Admitting: *Deleted

## 2021-08-26 ENCOUNTER — Ambulatory Visit: Payer: Managed Care, Other (non HMO) | Admitting: Urology

## 2021-08-26 ENCOUNTER — Ambulatory Visit
Admission: RE | Admit: 2021-08-26 | Discharge: 2021-08-26 | Disposition: A | Discharge status: Home / Self Care | Payer: Managed Care, Other (non HMO) | Source: Ambulatory Visit | Attending: Urology | Admitting: Urology

## 2021-08-26 VITALS — BP 152/116 | HR 111 | Ht 66.0 in | Wt 238.0 lb

## 2021-08-26 DIAGNOSIS — N200 Calculus of kidney: Secondary | ICD-10-CM

## 2021-08-26 DIAGNOSIS — R3129 Other microscopic hematuria: Secondary | ICD-10-CM | POA: Diagnosis not present

## 2021-08-26 DIAGNOSIS — N201 Calculus of ureter: Secondary | ICD-10-CM | POA: Diagnosis not present

## 2021-08-26 DIAGNOSIS — N132 Hydronephrosis with renal and ureteral calculous obstruction: Secondary | ICD-10-CM | POA: Diagnosis not present

## 2021-08-26 DIAGNOSIS — E876 Hypokalemia: Secondary | ICD-10-CM | POA: Diagnosis not present

## 2021-08-26 HISTORY — PX: EXTRACORPOREAL SHOCK WAVE LITHOTRIPSY: SHX1557

## 2021-08-26 SURGERY — LITHOTRIPSY, ESWL
Anesthesia: Moderate Sedation | Laterality: Right

## 2021-08-26 MED ORDER — DIAZEPAM 5 MG PO TABS
10.0000 mg | ORAL_TABLET | ORAL | Status: AC
  Administered 2021-08-26: 10 mg via ORAL

## 2021-08-26 MED ORDER — SODIUM CHLORIDE 0.9 % IV SOLN: INTRAVENOUS | Status: DC

## 2021-08-26 MED ORDER — CEPHALEXIN 500 MG PO CAPS
ORAL_CAPSULE | ORAL | Status: AC
  Filled 2021-08-26: qty 1

## 2021-08-26 MED ORDER — ONDANSETRON HCL 4 MG/2ML IJ SOLN
INTRAMUSCULAR | Status: AC
  Filled 2021-08-26: qty 2

## 2021-08-26 MED ORDER — ONDANSETRON HCL 4 MG/2ML IJ SOLN: 4.0000 mg | Freq: Once | INTRAMUSCULAR | Status: DC

## 2021-08-26 MED ORDER — CEPHALEXIN 500 MG PO CAPS
500.0000 mg | ORAL_CAPSULE | Freq: Once | ORAL | Status: AC
  Administered 2021-08-26: 500 mg via ORAL

## 2021-08-26 MED ORDER — DIPHENHYDRAMINE HCL 25 MG PO CAPS
25.0000 mg | ORAL_CAPSULE | ORAL | Status: AC
  Administered 2021-08-26: 25 mg via ORAL

## 2021-08-26 MED ORDER — DIAZEPAM 5 MG PO TABS
ORAL_TABLET | ORAL | Status: AC
  Filled 2021-08-26: qty 2

## 2021-08-26 MED ORDER — DIPHENHYDRAMINE HCL 25 MG PO CAPS
ORAL_CAPSULE | ORAL | Status: AC
  Filled 2021-08-26: qty 1

## 2021-08-26 NOTE — Brief Op Note (Signed)
08/26/2021  10:32 AM  PATIENT:  [name]  51 y.o. male  PRE-OPERATIVE DIAGNOSIS:  1cm right UPJ stone  POST-OPERATIVE DIAGNOSIS:  Same  PROCEDURE:  Procedure(s): EXTRACORPOREAL SHOCK WAVE LITHOTRIPSY (ESWL) (Right)  SURGEON:  Surgeon(s) and Role:    Sondra Come, MD - Primary  ANESTHESIA: Conscious Sedation  EBL:  None  Drains: None  Specimen: None  Findings:  Large stone, appeared to fragment nicely, tolerated SWL well  DISPO: Flomax, pain meds PRN, RTC 2 weeks KUB  Legrand Rams, MD 08/26/2021

## 2021-08-26 NOTE — Discharge Instructions (Signed)
AMBULATORY SURGERY  ?DISCHARGE INSTRUCTIONS ? ? ?The drugs that you were given will stay in your system until tomorrow so for the next 24 hours you should not: ? ?Drive an automobile ?Make any legal decisions ?Drink any alcoholic beverage ? ? ?You may resume regular meals tomorrow.  Today it is better to start with liquids and gradually work up to solid foods. ? ?You may eat anything you prefer, but it is better to start with liquids, then soup and crackers, and gradually work up to solid foods. ? ? ?Please notify your doctor immediately if you have any unusual bleeding, trouble breathing, redness and pain at the surgery site, drainage, fever, or pain not relieved by medication. ? ? ? ?Additional Instructions: ? ? ? ?Please contact your physician with any problems or Same Day Surgery at [phone], Monday through Friday 6 am to 4 pm, or [facility] [place] Main number at [phone].  ?

## 2021-08-26 NOTE — Interval H&P Note (Signed)
UROLOGY H&P UPDATE  Agree with prior H&P dated 08/26/21 by Michiel Cowboy, PA. Right 1cm UPJ stone and renal colic.  Cardiac: RRR Lungs: CTA bilaterally  Laterality: RIGHT Procedure: Right shockwave lithotripsy  Urine: >50 RBCs, otherwise benign  Informed consent obtained, we specifically discussed the risks of bleeding/hematoma, infection, obstructive fragments/steinstrasse, post-operative pain, need for additional procedures.  Sondra Come, MD 08/26/2021

## 2021-08-27 ENCOUNTER — Encounter: Payer: Self-pay | Admitting: Urology

## 2021-08-28 ENCOUNTER — Encounter: Payer: Self-pay | Admitting: Radiology

## 2021-08-28 ENCOUNTER — Other Ambulatory Visit: Payer: Self-pay

## 2021-08-28 ENCOUNTER — Inpatient Hospital Stay
Admission: EM | Admit: 2021-08-28 | Discharge: 2021-08-31 | DRG: 661 | Disposition: A | Discharge status: Home / Self Care | Payer: Managed Care, Other (non HMO) | Attending: Internal Medicine | Admitting: Internal Medicine

## 2021-08-28 ENCOUNTER — Emergency Department: Payer: Managed Care, Other (non HMO)

## 2021-08-28 ENCOUNTER — Observation Stay: Payer: Managed Care, Other (non HMO)

## 2021-08-28 DIAGNOSIS — E876 Hypokalemia: Secondary | ICD-10-CM | POA: Diagnosis not present

## 2021-08-28 DIAGNOSIS — R109 Unspecified abdominal pain: Secondary | ICD-10-CM | POA: Diagnosis not present

## 2021-08-28 DIAGNOSIS — K219 Gastro-esophageal reflux disease without esophagitis: Secondary | ICD-10-CM | POA: Diagnosis present

## 2021-08-28 DIAGNOSIS — Z20822 Contact with and (suspected) exposure to covid-19: Secondary | ICD-10-CM | POA: Diagnosis present

## 2021-08-28 DIAGNOSIS — R112 Nausea with vomiting, unspecified: Secondary | ICD-10-CM | POA: Diagnosis not present

## 2021-08-28 DIAGNOSIS — R52 Pain, unspecified: Secondary | ICD-10-CM

## 2021-08-28 DIAGNOSIS — N132 Hydronephrosis with renal and ureteral calculous obstruction: Principal | ICD-10-CM | POA: Diagnosis present

## 2021-08-28 DIAGNOSIS — N200 Calculus of kidney: Secondary | ICD-10-CM

## 2021-08-28 DIAGNOSIS — R350 Frequency of micturition: Secondary | ICD-10-CM

## 2021-08-28 DIAGNOSIS — Z8042 Family history of malignant neoplasm of prostate: Secondary | ICD-10-CM

## 2021-08-28 DIAGNOSIS — N400 Enlarged prostate without lower urinary tract symptoms: Secondary | ICD-10-CM | POA: Diagnosis present

## 2021-08-28 DIAGNOSIS — Z79899 Other long term (current) drug therapy: Secondary | ICD-10-CM

## 2021-08-28 DIAGNOSIS — Z880 Allergy status to penicillin: Secondary | ICD-10-CM

## 2021-08-28 DIAGNOSIS — N401 Enlarged prostate with lower urinary tract symptoms: Secondary | ICD-10-CM

## 2021-08-28 DIAGNOSIS — Z841 Family history of disorders of kidney and ureter: Secondary | ICD-10-CM

## 2021-08-28 DIAGNOSIS — Z9884 Bariatric surgery status: Secondary | ICD-10-CM

## 2021-08-28 DIAGNOSIS — I10 Essential (primary) hypertension: Secondary | ICD-10-CM | POA: Diagnosis present

## 2021-08-28 LAB — CBC WITH DIFFERENTIAL/PLATELET
Abs Immature Granulocytes: 0.05 10*3/uL (ref 0.00–0.07)
Basophils Absolute: 0.1 10*3/uL (ref 0.0–0.1)
Basophils Relative: 0 %
Eosinophils Absolute: 0.3 10*3/uL (ref 0.0–0.5)
Eosinophils Relative: 3 %
HCT: 39.8 % (ref 39.0–52.0)
Hemoglobin: 12.2 g/dL — ABNORMAL LOW (ref 13.0–17.0)
Immature Granulocytes: 0 %
Lymphocytes Relative: 15 %
Lymphs Abs: 1.7 10*3/uL (ref 0.7–4.0)
MCH: 21.5 pg — ABNORMAL LOW (ref 26.0–34.0)
MCHC: 30.7 g/dL (ref 30.0–36.0)
MCV: 70.2 fL — ABNORMAL LOW (ref 80.0–100.0)
Monocytes Absolute: 1 10*3/uL (ref 0.1–1.0)
Monocytes Relative: 8 %
Neutro Abs: 8.6 10*3/uL — ABNORMAL HIGH (ref 1.7–7.7)
Neutrophils Relative %: 74 %
Platelets: 430 10*3/uL — ABNORMAL HIGH (ref 150–400)
RBC: 5.67 MIL/uL (ref 4.22–5.81)
RDW: 17.1 % — ABNORMAL HIGH (ref 11.5–15.5)
WBC: 11.7 10*3/uL — ABNORMAL HIGH (ref 4.0–10.5)
nRBC: 0 % (ref 0.0–0.2)

## 2021-08-28 LAB — BASIC METABOLIC PANEL
Anion gap: 12 (ref 5–15)
BUN: 13 mg/dL (ref 6–20)
CO2: 23 mmol/L (ref 22–32)
Calcium: 9.1 mg/dL (ref 8.9–10.3)
Chloride: 102 mmol/L (ref 98–111)
Creatinine, Ser: 1.07 mg/dL (ref 0.61–1.24)
GFR, Estimated: >60 mL/min (ref >60)
Glucose, Bld: 176 mg/dL — ABNORMAL HIGH (ref 70–99)
Potassium: 3 mmol/L — ABNORMAL LOW (ref 3.5–5.1)
Sodium: 137 mmol/L (ref 135–145)

## 2021-08-28 LAB — URINALYSIS, ROUTINE W REFLEX MICROSCOPIC
Bacteria, UA: NONE SEEN
Bilirubin Urine: NEGATIVE
Glucose, UA: 250 mg/dL — AB
Ketones, ur: 40 mg/dL — AB
Leukocytes,Ua: NEGATIVE
Nitrite: NEGATIVE
Protein, ur: NEGATIVE mg/dL
Specific Gravity, Urine: 1.02 (ref 1.005–1.030)
pH: 7 (ref 5.0–8.0)

## 2021-08-28 LAB — RESP PANEL BY RT-PCR (FLU A&B, COVID) ARPGX2
Influenza A by PCR: NEGATIVE
Influenza B by PCR: NEGATIVE
SARS Coronavirus 2 by RT PCR: NEGATIVE

## 2021-08-28 MED ORDER — MAGNESIUM HYDROXIDE 400 MG/5ML PO SUSP
30.0000 mL | Freq: Every day | ORAL | Status: DC | PRN
  Filled 2021-08-28: qty 30

## 2021-08-28 MED ORDER — LOSARTAN POTASSIUM 25 MG PO TABS
25.0000 mg | ORAL_TABLET | Freq: Every day | ORAL | Status: DC
  Administered 2021-08-29 – 2021-08-30 (×2): 25 mg via ORAL
  Filled 2021-08-28 (×3): qty 1

## 2021-08-28 MED ORDER — TAMSULOSIN HCL 0.4 MG PO CAPS
0.4000 mg | ORAL_CAPSULE | Freq: Every day | ORAL | Status: DC
  Administered 2021-08-28 – 2021-08-30 (×3): 0.4 mg via ORAL
  Filled 2021-08-28 (×4): qty 1

## 2021-08-28 MED ORDER — ONDANSETRON HCL 4 MG/2ML IJ SOLN
4.0000 mg | Freq: Once | INTRAMUSCULAR | Status: AC
  Administered 2021-08-28: 4 mg via INTRAVENOUS
  Filled 2021-08-28: qty 2

## 2021-08-28 MED ORDER — HYDROMORPHONE HCL 1 MG/ML IJ SOLN
0.5000 mg | Freq: Once | INTRAMUSCULAR | Status: AC
  Administered 2021-08-28: 0.5 mg via INTRAVENOUS
  Filled 2021-08-28: qty 1

## 2021-08-28 MED ORDER — SODIUM CHLORIDE 0.9 % IV BOLUS
1000.0000 mL | Freq: Once | INTRAVENOUS | Status: AC
  Administered 2021-08-28: 1000 mL via INTRAVENOUS

## 2021-08-28 MED ORDER — ONDANSETRON HCL 4 MG/2ML IJ SOLN
4.0000 mg | Freq: Four times a day (QID) | INTRAMUSCULAR | Status: DC | PRN
  Administered 2021-08-29 (×2): 4 mg via INTRAVENOUS
  Filled 2021-08-28 (×2): qty 2

## 2021-08-28 MED ORDER — MORPHINE SULFATE (PF) 4 MG/ML IV SOLN
4.0000 mg | Freq: Once | INTRAVENOUS | Status: AC
  Administered 2021-08-28: 4 mg via INTRAVENOUS
  Filled 2021-08-28: qty 1

## 2021-08-28 MED ORDER — POTASSIUM CHLORIDE 10 MEQ/100ML IV SOLN
10.0000 meq | Freq: Once | INTRAVENOUS | Status: AC
  Administered 2021-08-28: 10 meq via INTRAVENOUS
  Filled 2021-08-28: qty 100

## 2021-08-28 MED ORDER — OXYCODONE HCL 5 MG PO TABS
5.0000 mg | ORAL_TABLET | ORAL | Status: DC | PRN
  Administered 2021-08-28 – 2021-08-29 (×4): 5 mg via ORAL
  Filled 2021-08-28 (×5): qty 1

## 2021-08-28 MED ORDER — KETOROLAC TROMETHAMINE 30 MG/ML IJ SOLN
15.0000 mg | Freq: Once | INTRAMUSCULAR | Status: AC
  Administered 2021-08-28: 15 mg via INTRAVENOUS
  Filled 2021-08-28: qty 1

## 2021-08-28 MED ORDER — SODIUM CHLORIDE 0.9 % IV SOLN
1.0000 g | INTRAVENOUS | Status: DC
  Administered 2021-08-28 – 2021-08-29 (×2): 1 g via INTRAVENOUS
  Filled 2021-08-28 (×2): qty 10

## 2021-08-28 MED ORDER — TRAZODONE HCL 50 MG PO TABS: 25.0000 mg | ORAL_TABLET | Freq: Every evening | ORAL | Status: DC | PRN

## 2021-08-28 MED ORDER — ONDANSETRON HCL 4 MG PO TABS: 4.0000 mg | ORAL_TABLET | Freq: Every day | ORAL | Status: DC | PRN

## 2021-08-28 MED ORDER — ACETAMINOPHEN 325 MG PO TABS: 650.0000 mg | ORAL_TABLET | Freq: Four times a day (QID) | ORAL | Status: DC | PRN

## 2021-08-28 MED ORDER — PANTOPRAZOLE SODIUM 40 MG PO TBEC
40.0000 mg | DELAYED_RELEASE_TABLET | Freq: Every day | ORAL | Status: DC
  Administered 2021-08-28 – 2021-08-30 (×3): 40 mg via ORAL
  Filled 2021-08-28 (×4): qty 1

## 2021-08-28 MED ORDER — ONDANSETRON HCL 4 MG PO TABS: 4.0000 mg | ORAL_TABLET | Freq: Four times a day (QID) | ORAL | Status: DC | PRN

## 2021-08-28 MED ORDER — ENOXAPARIN SODIUM 60 MG/0.6ML IJ SOSY
0.5000 mg/kg | PREFILLED_SYRINGE | INTRAMUSCULAR | Status: DC
  Administered 2021-08-28 – 2021-08-30 (×3): 55 mg via SUBCUTANEOUS
  Filled 2021-08-28 (×4): qty 0.6

## 2021-08-28 MED ORDER — POTASSIUM CHLORIDE IN NACL 20-0.9 MEQ/L-% IV SOLN
INTRAVENOUS | Status: DC
  Filled 2021-08-28 (×8): qty 1000

## 2021-08-28 MED ORDER — HYDRALAZINE HCL 20 MG/ML IJ SOLN: 10.0000 mg | INTRAMUSCULAR | Status: DC | PRN

## 2021-08-28 MED ORDER — ACETAMINOPHEN 650 MG RE SUPP: 650.0000 mg | Freq: Four times a day (QID) | RECTAL | Status: DC | PRN

## 2021-08-28 NOTE — H&P (Addendum)
[place]   PATIENT NAME: [name]    MR#:  [id number]  DATE OF BIRTH:  06-Dec-1970  DATE OF ADMISSION:  08/28/2021  PRIMARY CARE PHYSICIAN: Valera Castle, MD   Patient is coming from: Home  REQUESTING/REFERRING PHYSICIAN: Lurline Hare, MD  CHIEF COMPLAINT:   Chief Complaint  Patient presents with   Flank Pain    HISTORY OF PRESENT ILLNESS:  ENGLISH COIT is a 51 y.o. Caucasian male with medical history significant for GERD, hypertension, obstructive sleep apnea and urolithiasis, who presented to the ER with acute onset of recurrent nausea and vomiting since yesterday a.m.  He started having significant right flank pain in the afternoon and took 2 oxycodone without much help.  He admits to dysuria without hematuria but has been having urinary frequency.  He had a lithotripsy on 1/26.  No chest pain or dyspnea.  No cough or wheezing ED Course: When the patient came to the ER BP was 122/104 with otherwise normal vital signs.  Labs revealed hypokalemia of 3 with otherwise unremarkable BMP.  CBC showed leukocytosis of 11.7 compared to 10.4 on 1/25.  Influenza antigens and COVID-19 PCR came back negative.  UA showed 40 ketones and 11-20 RBCs with 6-10 WBCs and negative for leukocytes and negative nitrates with 250 glucose.  Imaging: Abdominal x-ray showed right renal calculus not clearly visible and calcification of the right pelvis that could indicate stone fragment within the bladder or distal ureter.  The patient was given 0.5 mg IV Dilaudid, 15 mg of IV Toradol, Zofran 4 mg IV twice, morphine sulfate 4 mg IV, 2 L bolus of IV normal saline and 10 mg IV potassium chloride twice.  He will be admitted to a medical observation bed for further evaluation and management. PAST MEDICAL HISTORY:   Past Medical History:  Diagnosis Date   Acid reflux    GERD (gastroesophageal reflux disease)    Hematuria    Hypertension    Kidney stones    Sleep apnea    HAD  GASTRIC SLEEVE AND HAS COME OFF OF CPAP   Urolithiasis     PAST SURGICAL HISTORY:   Past Surgical History:  Procedure Laterality Date   CYSTOSCOPY W/ RETROGRADES Left 08/08/2016   Procedure: CYSTOSCOPY WITH RETROGRADE PYELOGRAM;  Surgeon: Hollice Espy, MD;  Location: [facility];  Service: Urology;  Laterality: Left;   CYSTOSCOPY W/ RETROGRADES Bilateral 03/05/2016   Procedure: CYSTOSCOPY WITH RETROGRADE PYELOGRAM;  Surgeon: Nickie Retort, MD;  Location: [facility];  Service: Urology;  Laterality: Bilateral;   CYSTOSCOPY W/ URETERAL STENT PLACEMENT Bilateral 03/29/2016   Procedure: CYSTOSCOPY WITH STENT REPLACEMENT;  Surgeon: Hollice Espy, MD;  Location: [facility];  Service: Urology;  Laterality: Bilateral;   CYSTOSCOPY W/ URETERAL STENT REMOVAL Left 08/08/2016   Procedure: CYSTOSCOPY WITH STENT REMOVAL;  Surgeon: Hollice Espy, MD;  Location: [facility];  Service: Urology;  Laterality: Left;   CYSTOSCOPY WITH STENT PLACEMENT Left 07/18/2016   Procedure: CYSTOSCOPY WITH STENT PLACEMENT;  Surgeon: Hollice Espy, MD;  Location: [facility];  Service: Urology;  Laterality: Left;   CYSTOSCOPY WITH STENT PLACEMENT Bilateral 03/05/2016   Procedure: CYSTOSCOPY WITH STENT PLACEMENT;  Surgeon: Nickie Retort, MD;  Location: [facility];  Service: Urology;  Laterality: Bilateral;   CYSTOSCOPY/RETROGRADE/URETEROSCOPY Left 07/18/2016   Procedure: CYSTOSCOPY/RETROGRADE/URETEROSCOPY;  Surgeon: Hollice Espy, MD;  Location: [facility];  Service: Urology;  Laterality: Left;   EXTRACORPOREAL SHOCK WAVE LITHOTRIPSY Right 03/03/2016   Procedure: EXTRACORPOREAL SHOCK  WAVE LITHOTRIPSY (ESWL);  Surgeon: Hollice Espy, MD;  Location: [facility];  Service: Urology;  Laterality: Right;   EXTRACORPOREAL SHOCK WAVE LITHOTRIPSY Right 08/26/2021   Procedure: EXTRACORPOREAL SHOCK WAVE LITHOTRIPSY (ESWL);  Surgeon: Billey Co, MD;  Location: [facility];  Service: Urology;  Laterality: Right;   KNEE SURGERY      LAPAROSCOPIC GASTRIC SLEEVE RESECTION  2014   SEPTOPLASTY Bilateral 06/07/2018   Procedure: SEPTOPLASTY;  Surgeon: Margaretha Sheffield, MD;  Location: [place];  Service: ENT;  Laterality: Bilateral;   STONE EXTRACTION WITH BASKET Left 07/18/2016   Procedure: STONE EXTRACTION WITH BASKET;  Surgeon: Hollice Espy, MD;  Location: [facility];  Service: Urology;  Laterality: Left;   TURBINATE REDUCTION Bilateral 06/07/2018   Procedure: INFERIOR TURBINATE REDUCTION;  Surgeon: Margaretha Sheffield, MD;  Location: [place];  Service: ENT;  Laterality: Bilateral;   URETEROSCOPY Left 03/29/2016   Procedure: URETEROSCOPY;  Surgeon: Hollice Espy, MD;  Location: [facility];  Service: Urology;  Laterality: Left;   URETEROSCOPY WITH HOLMIUM LASER LITHOTRIPSY Right 03/29/2016   Procedure: URETEROSCOPY WITH HOLMIUM LASER LITHOTRIPSY;  Surgeon: Hollice Espy, MD;  Location: [facility];  Service: Urology;  Laterality: Right;    SOCIAL HISTORY:   Social History   Tobacco Use   Smoking status: Never   Smokeless tobacco: Never  Substance Use Topics   Alcohol use: Yes    Alcohol/week: 0.0 standard drinks    Comment: occ.     FAMILY HISTORY:   Family History  Problem Relation Age of Onset   Prostate cancer Father    Kidney disease Mother    Kidney cancer Neg Hx    Bladder Cancer Neg Hx     DRUG ALLERGIES:   Allergies  Allergen Reactions   Penicillins Itching and Rash    Has patient had a PCN reaction causing immediate rash, facial/tongue/throat swelling, SOB or lightheadedness with hypotension: No Has patient had a PCN reaction causing severe rash involving mucus membranes or skin necrosis: No Has patient had a PCN reaction that required hospitalization No Has patient had a PCN reaction occurring within the last 10 years: No If all of the above answers are "NO", then may proceed with Cephalosporin use.    REVIEW OF SYSTEMS:   ROS As per history of present illness. All  pertinent systems were reviewed above. Constitutional, HEENT, cardiovascular, respiratory, GI, GU, musculoskeletal, neuro, psychiatric, endocrine, integumentary and hematologic systems were reviewed and are otherwise negative/unremarkable except for positive findings mentioned above in the HPI.   MEDICATIONS AT HOME:   Prior to Admission medications   Medication Sig Start Date End Date Taking? Authorizing Provider  omeprazole (PRILOSEC) 20 MG capsule Take 20 mg by mouth at bedtime.  12/30/15 08/28/21 Yes [provider]  ondansetron (ZOFRAN) 4 MG tablet Take 1 tablet (4 mg total) by mouth daily as needed for nausea or vomiting. 08/25/21 08/25/22 Yes Vanessa Bad Axe, MD  oxycodone (OXY-IR) 5 MG capsule Take 1 capsule (5 mg total) by mouth every 4 (four) hours as needed. 08/25/21  Yes Vanessa Lawton, MD  tamsulosin (FLOMAX) 0.4 MG CAPS capsule Take 1 capsule (0.4 mg total) by mouth daily for 14 days. 08/25/21 09/08/21 Yes Vanessa Patoka, MD      VITAL SIGNS:  Blood pressure (!) 122/104, pulse (!) 102, temperature 99 F (37.2 C), temperature source Oral, resp. rate 20, height 5\' 6"  (1.676 m), weight 108.9 kg, SpO2 98 %.  PHYSICAL EXAMINATION:  Physical Exam  GENERAL:  51 y.o.-year-old patient lying in the bed with no acute distress.  EYES: Pupils equal, round, reactive to light and accommodation. No scleral icterus. Extraocular muscles intact.  HEENT: Head atraumatic, normocephalic. Oropharynx and nasopharynx clear.  NECK:  Supple, no jugular venous distention. No thyroid enlargement, no tenderness.  LUNGS: Normal breath sounds bilaterally, no wheezing, rales,rhonchi or crepitation. No use of accessory muscles of respiration.  CARDIOVASCULAR: Regular rate and rhythm, S1, S2 normal. No murmurs, rubs, or gallops.  ABDOMEN: Soft, nondistended, nontender. Bowel sounds present. No organomegaly or mass.  Minimal right CVA tenderness. EXTREMITIES: No pedal edema, cyanosis, or clubbing.  NEUROLOGIC:  Cranial nerves II through XII are intact. Muscle strength 5/5 in all extremities. Sensation intact. Gait not checked.  PSYCHIATRIC: The patient is alert and oriented x 3.  Normal affect and good eye contact. SKIN: No obvious rash, lesion, or ulcer.   LABORATORY PANEL:   CBC Recent Labs  Lab 08/28/21 0311  WBC 11.7*  HGB 12.2*  HCT 39.8  PLT 430*   ------------------------------------------------------------------------------------------------------------------  Chemistries  Recent Labs  Lab 08/25/21 1713 08/28/21 0311  NA 137 137  K 3.4* 3.0*  CL 102 102  CO2 26 23  GLUCOSE 120* 176*  BUN 16 13  CREATININE 1.00 1.07  CALCIUM 9.2 9.1  AST 21  --   ALT 23  --   ALKPHOS 69  --   BILITOT 0.5  --    ------------------------------------------------------------------------------------------------------------------  Cardiac Enzymes No results for input(s): TROPONINI in the last 168 hours. ------------------------------------------------------------------------------------------------------------------  RADIOLOGY:  DG Abdomen 1 View  Result Date: 08/28/2021 CLINICAL DATA:  Status post lithotripsy. EXAM: ABDOMEN - 1 VIEW COMPARISON:  08/26/2021 FINDINGS: Previously demonstrated right renal calculus is not clearly visible on the current study. Calcification in the right pelvis could indicate stone fragments within the bladder or distal ureter. Nonobstructive bowel gas pattern. IMPRESSION: Previously demonstrated right renal calculus not clearly visible on the current study. Calcification in the right pelvis could indicate stone fragments within the bladder or distal ureter. Electronically Signed   By: Ulyses Jarred M.D.   On: 08/28/2021 03:58   Abdomen 1 view (KUB)  Result Date: 08/27/2021 CLINICAL DATA:  Follow-up right-sided UPJ stone. EXAM: ABDOMEN - 1 VIEW COMPARISON:  CT without contrast 08/25/2021 FINDINGS: The bowel gas pattern is normal. Again noted is an ovoid 1.8 x 0.8  cm right UPJ stone at the level of the distal right L2 transverse process, unchanged in positioning. A 2 mm nonobstructing caliceal stone in the superior pole right kidney was visible on CT but is not radiographically visible. No new pathologic calcification is seen. Cholecystectomy clips are again shown. No acute osseous findings. Lung bases are clear. IMPRESSION: No interval change in position of the right UPJ stone. Electronically Signed   By: Telford Nab M.D.   On: 08/27/2021 01:24      IMPRESSION AND PLAN:  Principal Problem:   Intractable nausea and vomiting  1.  Intractable nausea and vomiting with associated right flank pain status post 2 right lithotripsy. - The patient will be admitted to an observation medical bed. - She will be hydrated with IV normal saline. - As needed antiemetics will be provided. - We will empirically place on IV Rocephin. - Urology consult can be obtained if needed.  2.  Hypokalemia. - We will replace potassium and check magnesium level.  3.  BPH. - We will continue Flomax.  4.  GERD. - We will continue PPI therapy.   DVT  prophylaxis: Lovenox. Code Status: full code.   Family Communication:  The plan of care was discussed in details with the patient (and family). I answered all questions. The patient agreed to proceed with the above mentioned plan. Further management will depend upon hospital course. Disposition Plan: Back to previous home environment Consults called: Urology All the records are reviewed and case discussed with ED provider.  Status is: Observation   I certify that at the time of admission, it is my clinical judgment that the patient will require inpatient hospital care extending less than 2 midnights.                            Dispo: The patient is from: Home              Anticipated d/c is to: Home              Patient currently is not medically stable to d/c.              Difficult to place patient: No  Christel Mormon  M.D on 08/28/2021 at 6:45 AM  [facility]   From 7 PM-7 AM, contact night-coverage [email or web address]  CC: Primary care physician; Valera Castle, MD

## 2021-08-28 NOTE — ED Notes (Signed)
RN to bedside to introduce self to pt. Pt CAOx4  and in no acute distress. IV potassium in place.

## 2021-08-28 NOTE — ED Notes (Signed)
Pt states no improvement in pain or nausea

## 2021-08-28 NOTE — Progress Notes (Signed)
Brief hospitalist update note.  This is a nonbillable note.  Please see scanned H&P for full billable details.  Briefly, this is a 51 year old male who had a recent lithotripsy for obstructing nephrolithiasis on 1/26.  Presents to the ED today due to back pain radiating to flanks and lower abdomen.  In significant pain on admission.  Required several doses of IV narcotic.  At the time of my evaluation patient is relatively comfortable appearing.  Rates his pain as a 2/10.  Plan: Continue observation status As needed pain control Empiric abx Follow urine culture IV fluids Check renal US If continued concern for obstruction will reach out to urology  Lolita Patella MD  No charge

## 2021-08-28 NOTE — Progress Notes (Signed)
PHARMACIST - PHYSICIAN COMMUNICATION  CONCERNING:  Enoxaparin (Lovenox) for DVT Prophylaxis    RECOMMENDATION: Patient was prescribed enoxaprin 40mg  q24 hours for VTE prophylaxis.   Filed Weights   08/28/21 0304  Weight: 108.9 kg (240 lb)    Body mass index is 38.74 kg/m.  Estimated Creatinine Clearance: 93.4 mL/min (by C-G formula based on SCr of 1.07 mg/dL).   Based on [place] patient is candidate for enoxaparin 0.5mg /kg TBW SQ every 24 hours based on BMI being >30.   DESCRIPTION: Pharmacy has adjusted enoxaparin dose per [facility] policy.  Patient is now receiving enoxaparin 0.5 mg/kg every 24 hours   Renda Rolls, PharmD, [facility] 08/28/2021 6:48 AM

## 2021-08-28 NOTE — ED Triage Notes (Signed)
Pt states known history of renal calculi and had lithotripsy on Thursday. Pt states he continues to have pain and vomiting. Pt is pale. Complains of left flank pain radiating to llq.

## 2021-08-28 NOTE — ED Provider Notes (Signed)
[facility] Provider Note    Event Date/Time   First MD Initiated Contact with Patient 08/28/21 [phone]     (approximate)   History   Flank Pain   HPI  [name] is a 51 y.o. male who presents to the ED from home with a chief complaint of right flank/abdominal pain and vomiting.  Patient had lithotripsy on 08/26/2021 for 11 x 13 mm right UPJ stone.  Has not been able to manage his pain with Percocet at home and began vomiting last evening.  Presents to the ED pale, diaphoretic and dry heaving.  Denies fever, cough, chest pain, shortness of breath, hematuria.     Past Medical History   Past Medical History:  Diagnosis Date   Acid reflux    GERD (gastroesophageal reflux disease)    Hematuria    Hypertension    Kidney stones    Sleep apnea    HAD GASTRIC SLEEVE AND HAS COME OFF OF CPAP   Urolithiasis      Active Problem List   Patient Active Problem List   Diagnosis Date Noted   Overweight 01/09/2019   Gastroesophageal reflux disease 01/09/2019   Joint pain 01/09/2019   Obstructive sleep apnea of adult 01/09/2019   Morbid obesity ([place]) 08/05/2016   Sepsis secondary to UTI ([place]) 03/15/2016   Kidney stones 03/04/2016   Nocturia 06/13/2013   Frequency of micturition 06/13/2013     Past Surgical History   Past Surgical History:  Procedure Laterality Date   CYSTOSCOPY W/ RETROGRADES Left 08/08/2016   Procedure: CYSTOSCOPY WITH RETROGRADE PYELOGRAM;  Surgeon: Hollice Espy, MD;  Location: [facility];  Service: Urology;  Laterality: Left;   CYSTOSCOPY W/ RETROGRADES Bilateral 03/05/2016   Procedure: CYSTOSCOPY WITH RETROGRADE PYELOGRAM;  Surgeon: Nickie Retort, MD;  Location: [facility];  Service: Urology;  Laterality: Bilateral;   CYSTOSCOPY W/ URETERAL STENT PLACEMENT Bilateral 03/29/2016   Procedure: CYSTOSCOPY WITH STENT REPLACEMENT;  Surgeon: Hollice Espy, MD;  Location: [facility];  Service: Urology;  Laterality: Bilateral;   CYSTOSCOPY  W/ URETERAL STENT REMOVAL Left 08/08/2016   Procedure: CYSTOSCOPY WITH STENT REMOVAL;  Surgeon: Hollice Espy, MD;  Location: [facility];  Service: Urology;  Laterality: Left;   CYSTOSCOPY WITH STENT PLACEMENT Left 07/18/2016   Procedure: CYSTOSCOPY WITH STENT PLACEMENT;  Surgeon: Hollice Espy, MD;  Location: [facility];  Service: Urology;  Laterality: Left;   CYSTOSCOPY WITH STENT PLACEMENT Bilateral 03/05/2016   Procedure: CYSTOSCOPY WITH STENT PLACEMENT;  Surgeon: Nickie Retort, MD;  Location: [facility];  Service: Urology;  Laterality: Bilateral;   CYSTOSCOPY/RETROGRADE/URETEROSCOPY Left 07/18/2016   Procedure: CYSTOSCOPY/RETROGRADE/URETEROSCOPY;  Surgeon: Hollice Espy, MD;  Location: [facility];  Service: Urology;  Laterality: Left;   EXTRACORPOREAL SHOCK WAVE LITHOTRIPSY Right 03/03/2016   Procedure: EXTRACORPOREAL SHOCK WAVE LITHOTRIPSY (ESWL);  Surgeon: Hollice Espy, MD;  Location: [facility];  Service: Urology;  Laterality: Right;   EXTRACORPOREAL SHOCK WAVE LITHOTRIPSY Right 08/26/2021   Procedure: EXTRACORPOREAL SHOCK WAVE LITHOTRIPSY (ESWL);  Surgeon: Billey Co, MD;  Location: [facility];  Service: Urology;  Laterality: Right;   KNEE SURGERY     LAPAROSCOPIC GASTRIC SLEEVE RESECTION  2014   SEPTOPLASTY Bilateral 06/07/2018   Procedure: SEPTOPLASTY;  Surgeon: Margaretha Sheffield, MD;  Location: [place];  Service: ENT;  Laterality: Bilateral;   STONE EXTRACTION WITH BASKET Left 07/18/2016   Procedure: STONE EXTRACTION WITH BASKET;  Surgeon: Hollice Espy, MD;  Location: [facility];  Service: Urology;  Laterality: Left;  TURBINATE REDUCTION Bilateral 06/07/2018   Procedure: INFERIOR TURBINATE REDUCTION;  Surgeon: Margaretha Sheffield, MD;  Location: [place];  Service: ENT;  Laterality: Bilateral;   URETEROSCOPY Left 03/29/2016   Procedure: URETEROSCOPY;  Surgeon: Hollice Espy, MD;  Location: [facility];  Service: Urology;  Laterality: Left;   URETEROSCOPY WITH HOLMIUM LASER  LITHOTRIPSY Right 03/29/2016   Procedure: URETEROSCOPY WITH HOLMIUM LASER LITHOTRIPSY;  Surgeon: Hollice Espy, MD;  Location: [facility];  Service: Urology;  Laterality: Right;     Home Medications   Prior to Admission medications   Medication Sig Start Date End Date Taking? Authorizing Provider  omeprazole (PRILOSEC) 20 MG capsule Take 20 mg by mouth at bedtime.  12/30/15 08/26/21  [provider]  ondansetron (ZOFRAN) 4 MG tablet Take 1 tablet (4 mg total) by mouth daily as needed for nausea or vomiting. 08/25/21 08/25/22  Vanessa Emily, MD  oxycodone (OXY-IR) 5 MG capsule Take 1 capsule (5 mg total) by mouth every 4 (four) hours as needed. 08/25/21   Vanessa Flemington, MD  tamsulosin (FLOMAX) 0.4 MG CAPS capsule Take 1 capsule (0.4 mg total) by mouth daily for 14 days. 08/25/21 09/08/21  Vanessa Martin, MD     Allergies  Penicillins   Family History   Family History  Problem Relation Age of Onset   Prostate cancer Father    Kidney disease Mother    Kidney cancer Neg Hx    Bladder Cancer Neg Hx      Physical Exam  Triage Vital Signs: ED Triage Vitals  Enc Vitals Group     BP 08/28/21 0303 (!) 122/104     Pulse Rate 08/28/21 0303 96     Resp 08/28/21 0303 18     Temp 08/28/21 0303 99 F (37.2 C)     Temp Source 08/28/21 0303 Oral     SpO2 08/28/21 0303 95 %     Weight 08/28/21 0304 240 lb (108.9 kg)     Height 08/28/21 0304 5\' 6"  (1.676 m)     Head Circumference --      Peak Flow --      Pain Score 08/28/21 0304 8     Pain Loc --      Pain Edu? --      Excl. in [place]? --     Updated Vital Signs: BP (!) 122/104    Pulse (!) 102    Temp 99 F (37.2 C) (Oral)    Resp 20    Ht 5\' 6"  (1.676 m)    Wt 108.9 kg    SpO2 98%    BMI 38.74 kg/m    General: Awake, moderate distress.  CV:  Tachycardic.  Good peripheral perfusion.  Resp:  Normal effort.  CTAB. Abd:  Mild right CVAT and abdominal tenderness to palpation without rebound or guarding.  No distention.  Other:  No  vesicles.   ED Results / Procedures / Treatments  Labs (all labs ordered are listed, but only abnormal results are displayed) Labs Reviewed  CBC WITH DIFFERENTIAL/PLATELET - Abnormal; Notable for the following components:      Result Value   WBC 11.7 (*)    Hemoglobin 12.2 (*)    MCV 70.2 (*)    MCH 21.5 (*)    RDW 17.1 (*)    Platelets 430 (*)    Neutro Abs 8.6 (*)    All other components within normal limits  BASIC METABOLIC PANEL - Abnormal; Notable for the following components:  Potassium 3.0 (*)    Glucose, Bld 176 (*)    All other components within normal limits  RESP PANEL BY RT-PCR (FLU A&B, COVID) ARPGX2  URINALYSIS, ROUTINE W REFLEX MICROSCOPIC     EKG  None   RADIOLOGY I have personally reviewed patient's KUB as well as the radiology interpretation:  Previously demonstrated right renal calculus not clearly visible, calcification in right pelvis likely indicates stone fragments  Official radiology report(s): DG Abdomen 1 View  Result Date: 08/28/2021 CLINICAL DATA:  Status post lithotripsy. EXAM: ABDOMEN - 1 VIEW COMPARISON:  08/26/2021 FINDINGS: Previously demonstrated right renal calculus is not clearly visible on the current study. Calcification in the right pelvis could indicate stone fragments within the bladder or distal ureter. Nonobstructive bowel gas pattern. IMPRESSION: Previously demonstrated right renal calculus not clearly visible on the current study. Calcification in the right pelvis could indicate stone fragments within the bladder or distal ureter. Electronically Signed   By: Ulyses Jarred M.D.   On: 08/28/2021 03:58     PROCEDURES:  Critical Care performed: Yes  CRITICAL CARE Performed by: Paulette Blanch   Total critical care time: 45 minutes  Critical care time was exclusive of separately billable procedures and treating other patients.  Critical care was necessary to treat or prevent imminent or life-threatening  deterioration.  Critical care was time spent personally by me on the following activities: development of treatment plan with patient and/or surrogate as well as nursing, discussions with consultants, evaluation of patient's response to treatment, examination of patient, obtaining history from patient or surrogate, ordering and performing treatments and interventions, ordering and review of laboratory studies, ordering and review of radiographic studies, pulse oximetry and re-evaluation of patient's condition.   Marland Kitchen1-3 Lead EKG Interpretation Performed by: Paulette Blanch, MD Authorized by: Paulette Blanch, MD     Interpretation: abnormal     ECG rate:  102   ECG rate assessment: tachycardic     Rhythm: sinus tachycardia     Ectopy: none     Conduction: normal   Comments:     Patient placed on cardiac monitor to evaluate for arrhythmias   MEDICATIONS ORDERED IN ED: Medications  sodium chloride 0.9 % bolus 1,000 mL (has no administration in time range)  ondansetron (ZOFRAN) injection 4 mg (has no administration in time range)  morphine 4 MG/ML injection 4 mg (has no administration in time range)  potassium chloride 10 mEq in 100 mL IVPB (has no administration in time range)  potassium chloride 10 mEq in 100 mL IVPB (has no administration in time range)  sodium chloride 0.9 % bolus 1,000 mL (0 mLs Intravenous Stopped 08/28/21 0459)  ondansetron (ZOFRAN) injection 4 mg (4 mg Intravenous Given 08/28/21 0318)  HYDROmorphone (DILAUDID) injection 0.5 mg (0.5 mg Intravenous Given 08/28/21 0318)  ketorolac (TORADOL) 30 MG/ML injection 15 mg (15 mg Intravenous Given 08/28/21 0502)  HYDROmorphone (DILAUDID) injection 0.5 mg (0.5 mg Intravenous Given 08/28/21 0502)     IMPRESSION / MDM / ASSESSMENT AND PLAN / ED COURSE  I reviewed the triage vital signs and the nursing notes.                             51 year old male presenting with right flank pain and vomiting status post recent lithotripsy.  Differential diagnosis includes, but is not limited to, acute appendicitis, renal colic, testicular torsion, urinary tract infection/pyelonephritis, prostatitis,  epididymitis, diverticulitis, small bowel obstruction or  ileus, colitis, abdominal aortic aneurysm, gastroenteritis, hernia, etc. I have personally reviewed patient's records including lithotripsy procedure dated 08/26/2021.  The patient is on the cardiac monitor to evaluate for evidence of arrhythmia and/or significant heart rate changes.  Laboratory results remarkable for mild leukocytosis, WBC 11.7; mild hypokalemia, potassium 3.0.  KUB does not demonstrate large right ureteral stone.  Awaiting UA.  Patient had 2 doses of IV Dilaudid, 1 dose of IV Toradol and 1 dose of IV Zofran while awaiting treatment room.  He is still diaphoretic and in severe pain with dry heaving.  Will trial IV morphine with second dose of IV Zofran.  Will administer second liter IV fluids.  Given his intractable pain and nausea, will consult hospitalist services for admission.  We will replete potassium via IV as patient is vomiting.      FINAL CLINICAL IMPRESSION(S) / ED DIAGNOSES   Final diagnoses:  Right flank pain  Hypokalemia  Intractable pain  Nausea and vomiting, unspecified vomiting type     Rx / DC Orders   ED Discharge Orders     None        Note:  This document was prepared using Dragon voice recognition software and may include unintentional dictation errors.   Paulette Blanch, MD 08/28/21 [phone]

## 2021-08-29 ENCOUNTER — Inpatient Hospital Stay: Payer: Managed Care, Other (non HMO)

## 2021-08-29 DIAGNOSIS — N1330 Unspecified hydronephrosis: Secondary | ICD-10-CM | POA: Diagnosis not present

## 2021-08-29 DIAGNOSIS — N132 Hydronephrosis with renal and ureteral calculous obstruction: Secondary | ICD-10-CM | POA: Diagnosis present

## 2021-08-29 DIAGNOSIS — Z79899 Other long term (current) drug therapy: Secondary | ICD-10-CM | POA: Diagnosis not present

## 2021-08-29 DIAGNOSIS — N400 Enlarged prostate without lower urinary tract symptoms: Secondary | ICD-10-CM | POA: Diagnosis present

## 2021-08-29 DIAGNOSIS — R112 Nausea with vomiting, unspecified: Secondary | ICD-10-CM | POA: Diagnosis not present

## 2021-08-29 DIAGNOSIS — Z8042 Family history of malignant neoplasm of prostate: Secondary | ICD-10-CM | POA: Diagnosis not present

## 2021-08-29 DIAGNOSIS — Z880 Allergy status to penicillin: Secondary | ICD-10-CM | POA: Diagnosis not present

## 2021-08-29 DIAGNOSIS — N201 Calculus of ureter: Secondary | ICD-10-CM | POA: Diagnosis not present

## 2021-08-29 DIAGNOSIS — K219 Gastro-esophageal reflux disease without esophagitis: Secondary | ICD-10-CM | POA: Diagnosis present

## 2021-08-29 DIAGNOSIS — Z9884 Bariatric surgery status: Secondary | ICD-10-CM | POA: Diagnosis not present

## 2021-08-29 DIAGNOSIS — R52 Pain, unspecified: Secondary | ICD-10-CM | POA: Diagnosis present

## 2021-08-29 DIAGNOSIS — E876 Hypokalemia: Secondary | ICD-10-CM | POA: Diagnosis present

## 2021-08-29 DIAGNOSIS — Z841 Family history of disorders of kidney and ureter: Secondary | ICD-10-CM | POA: Diagnosis not present

## 2021-08-29 DIAGNOSIS — I10 Essential (primary) hypertension: Secondary | ICD-10-CM | POA: Diagnosis present

## 2021-08-29 DIAGNOSIS — Z20822 Contact with and (suspected) exposure to covid-19: Secondary | ICD-10-CM | POA: Diagnosis present

## 2021-08-29 LAB — BASIC METABOLIC PANEL
Anion gap: 7 (ref 5–15)
BUN: 12 mg/dL (ref 6–20)
CO2: 24 mmol/L (ref 22–32)
Calcium: 9 mg/dL (ref 8.9–10.3)
Chloride: 104 mmol/L (ref 98–111)
Creatinine, Ser: 1.18 mg/dL (ref 0.61–1.24)
GFR, Estimated: >60 mL/min (ref >60)
Glucose, Bld: 102 mg/dL — ABNORMAL HIGH (ref 70–99)
Potassium: 3.6 mmol/L (ref 3.5–5.1)
Sodium: 135 mmol/L (ref 135–145)

## 2021-08-29 LAB — CBC
HCT: 34.9 % — ABNORMAL LOW (ref 39.0–52.0)
Hemoglobin: 10.9 g/dL — ABNORMAL LOW (ref 13.0–17.0)
MCH: 21.4 pg — ABNORMAL LOW (ref 26.0–34.0)
MCHC: 31.2 g/dL (ref 30.0–36.0)
MCV: 68.6 fL — ABNORMAL LOW (ref 80.0–100.0)
Platelets: 386 10*3/uL (ref 150–400)
RBC: 5.09 MIL/uL (ref 4.22–5.81)
RDW: 17.2 % — ABNORMAL HIGH (ref 11.5–15.5)
WBC: 10.3 10*3/uL (ref 4.0–10.5)
nRBC: 0 % (ref 0.0–0.2)

## 2021-08-29 LAB — HIV ANTIBODY (ROUTINE TESTING W REFLEX): HIV Screen 4th Generation wRfx: NONREACTIVE

## 2021-08-29 MED ORDER — OXYCODONE HCL 5 MG PO TABS
5.0000 mg | ORAL_TABLET | ORAL | Status: DC | PRN
Start: 2021-08-29 — End: 2021-08-31
  Administered 2021-08-29 (×2): 5 mg via ORAL
  Filled 2021-08-29 (×2): qty 1

## 2021-08-29 MED ORDER — PROCHLORPERAZINE EDISYLATE 10 MG/2ML IJ SOLN
10.0000 mg | Freq: Once | INTRAMUSCULAR | Status: AC
  Administered 2021-08-29: 10 mg via INTRAVENOUS
  Filled 2021-08-29: qty 2

## 2021-08-29 MED ORDER — HYDROMORPHONE HCL 1 MG/ML IJ SOLN
0.5000 mg | INTRAMUSCULAR | Status: DC | PRN
Start: 2021-08-29 — End: 2021-08-31

## 2021-08-29 MED ORDER — KETOROLAC TROMETHAMINE 15 MG/ML IJ SOLN
15.0000 mg | Freq: Four times a day (QID) | INTRAMUSCULAR | Status: DC
  Administered 2021-08-29 – 2021-08-31 (×8): 15 mg via INTRAVENOUS
  Filled 2021-08-29 (×10): qty 1

## 2021-08-29 MED ORDER — HYDROMORPHONE HCL 1 MG/ML IJ SOLN
0.5000 mg | Freq: Once | INTRAMUSCULAR | Status: AC
  Administered 2021-08-29: 0.5 mg via INTRAVENOUS
  Filled 2021-08-29 (×2): qty 1

## 2021-08-29 NOTE — Care Management (Signed)
°  Transition of Care [facility]) Screening Note   Patient Details  Name: [name] Date of Birth: 1971/04/30   Transition of Care [facility]) CM/SW Contact:    Pete Pelt, RN Phone Number: 08/29/2021, 9:25 AM    Transition of Care Department [facility]) has reviewed patient and no TOC needs have been identified at this time. We will continue to monitor patient advancement through interdisciplinary progression rounds. If new patient transition needs arise, please place a TOC consult.

## 2021-08-29 NOTE — Progress Notes (Signed)
PROGRESS NOTE    [name]  [id number] DOB: 1971/06/27 DOA: 08/28/2021 PCP: Valera Castle, MD    Brief Narrative:   51 year old male who had a recent lithotripsy for obstructing nephrolithiasis on 1/26.  Presents to the ED today due to back pain radiating to flanks and lower abdomen.  In significant pain on admission.  Required several doses of IV narcotic.  Patient had recurrent waves of pain associated with nausea and vomiting over interval.  Urinalysis unrevealing   Assessment & Plan:   Principal Problem:   Intractable nausea and vomiting Active Problems:   Intractable pain  Intractable back and lower abdominal pain Intractable nausea and vomiting Status post right lithotripsy for 1.3 cm stone Patient had lithotripsy on 1/26 Post procedurally had recurrence of lower back and abdominal pain associated with nausea and vomiting Initially was responding to conservative therapy Had recurrence of pain KUB and renal ultrasound show stone fragments in the bladder without evidence of 1.3 cm right ureteral stone UA with no evidence of infection Plan: CT renal stone study Continue IV fluids Multimodal pain control Discontinue antibiotics Urology to follow CT, if only specks will not intervene.  If significant fragments can consider stenting Continue Flomax 0.4 mg daily  Essential hypertension PTA losartan As needed IV hydralazine Ensure pain control  GERD PPI  Hypokalemia Resolved  DVT prophylaxis: SQ Lovenox Code Status: Full Family Communication: None today Disposition Plan: Status is: Inpatient  Remains inpatient appropriate because: Intractable pain in setting of recent lithotripsy   Level of care: Med-Surg  Consultants:  Urology  Procedures:  None  Antimicrobials:   Subjective: Seen and examined.  Has waves of lower back and abdominal pain associated with nausea and vomiting  Objective: Vitals:   08/28/21 2030 08/29/21 0539  08/29/21 0930 08/29/21 1301  BP: 125/66 132/83 (!) 152/95 127/80  Pulse: 87 82 98 95  Resp: 16 16 18 18   Temp: 98.3 F (36.8 C) 98.2 F (36.8 C) 98.3 F (36.8 C) 98.3 F (36.8 C)  TempSrc:  Oral  Oral  SpO2: 100% 94% 96% 95%  Weight:      Height:        Intake/Output Summary (Last 24 hours) at 08/29/2021 1445 Last data filed at 08/28/2021 1835 Gross per 24 hour  Intake 328.18 ml  Output --  Net 328.18 ml   Filed Weights   08/28/21 0304  Weight: 108.9 kg    Examination:  General exam: No acute distress Respiratory system: Clear to auscultation. Respiratory effort normal. Cardiovascular system: S1-S2, RRR, no murmurs, no pedal edema Gastrointestinal system: Soft, nondistended, mild TTP lower abdomen, normal bowel sounds Central nervous system: Alert and oriented. No focal neurological deficits. Extremities: Symmetric 5 x 5 power. Skin: No rashes, lesions or ulcers Psychiatry: Judgement and insight appear normal. Mood & affect appropriate.     Data Reviewed: I have personally reviewed following labs and imaging studies  CBC: Recent Labs  Lab 08/25/21 1713 08/28/21 0311 08/29/21 0446  WBC 10.4 11.7* 10.3  NEUTROABS  --  8.6*  --   HGB 12.6* 12.2* 10.9*  HCT 41.6 39.8 34.9*  MCV 70.6* 70.2* 68.6*  PLT 443* 430* [id number]   Basic Metabolic Panel: Recent Labs  Lab 08/25/21 1713 08/28/21 0311 08/29/21 0446  NA 137 137 135  K 3.4* 3.0* 3.6  CL 102 102 104  CO2 26 23 24   GLUCOSE 120* 176* 102*  BUN 16 13 12   CREATININE 1.00 1.07 1.18  CALCIUM 9.2 9.1  9.0   GFR: Estimated Creatinine Clearance: 84.7 mL/min (by C-G formula based on SCr of 1.18 mg/dL). Liver Function Tests: Recent Labs  Lab 08/25/21 1713  AST 21  ALT 23  ALKPHOS 69  BILITOT 0.5  PROT 7.2  ALBUMIN 3.8   Recent Labs  Lab 08/25/21 1713  LIPASE 29   No results for input(s): AMMONIA in the last 168 hours. Coagulation Profile: No results for input(s): INR, PROTIME in the last 168  hours. Cardiac Enzymes: No results for input(s): CKTOTAL, CKMB, CKMBINDEX, TROPONINI in the last 168 hours. BNP (last 3 results) No results for input(s): PROBNP in the last 8760 hours. HbA1C: No results for input(s): HGBA1C in the last 72 hours. CBG: No results for input(s): GLUCAP in the last 168 hours. Lipid Profile: No results for input(s): CHOL, HDL, LDLCALC, TRIG, CHOLHDL, LDLDIRECT in the last 72 hours. Thyroid Function Tests: No results for input(s): TSH, T4TOTAL, FREET4, T3FREE, THYROIDAB in the last 72 hours. Anemia Panel: No results for input(s): VITAMINB12, FOLATE, FERRITIN, TIBC, IRON, RETICCTPCT in the last 72 hours. Sepsis Labs: No results for input(s): PROCALCITON, LATICACIDVEN in the last 168 hours.  Recent Results (from the past 240 hour(s))  Resp Panel by RT-PCR (Flu A&B, Covid) Nasopharyngeal Swab     Status: None   Collection Time: 08/28/21  6:24 AM   Specimen: Nasopharyngeal Swab; Nasopharyngeal(NP) swabs in vial transport medium  Result Value Ref Range Status   SARS Coronavirus 2 by RT PCR NEGATIVE NEGATIVE Final    Comment: (NOTE) SARS-CoV-2 target nucleic acids are NOT DETECTED.  The SARS-CoV-2 RNA is generally detectable in upper respiratory specimens during the acute phase of infection. The lowest concentration of SARS-CoV-2 viral copies this assay can detect is 138 copies/mL. A negative result does not preclude SARS-Cov-2 infection and should not be used as the sole basis for treatment or other patient management decisions. A negative result may occur with  improper specimen collection/handling, submission of specimen other than nasopharyngeal swab, presence of viral mutation(s) within the areas targeted by this assay, and inadequate number of viral copies(<138 copies/mL). A negative result must be combined with clinical observations, patient history, and epidemiological information. The expected result is Negative.  Fact Sheet for Patients:   EntrepreneurPulse.com.au  Fact Sheet for Healthcare Providers:  IncredibleEmployment.be  This test is no t yet approved or cleared by the [place] FDA and  has been authorized for detection and/or diagnosis of SARS-CoV-2 by FDA under an Emergency Use Authorization (EUA). This EUA will remain  in effect (meaning this test can be used) for the duration of the COVID-19 declaration under Section 564(b)(1) of the Act, 21 U.S.C.section 360bbb-3(b)(1), unless the authorization is terminated  or revoked sooner.       Influenza A by PCR NEGATIVE NEGATIVE Final   Influenza B by PCR NEGATIVE NEGATIVE Final    Comment: (NOTE) The Xpert Xpress SARS-CoV-2/FLU/RSV plus assay is intended as an aid in the diagnosis of influenza from Nasopharyngeal swab specimens and should not be used as a sole basis for treatment. Nasal washings and aspirates are unacceptable for Xpert Xpress SARS-CoV-2/FLU/RSV testing.  Fact Sheet for Patients: EntrepreneurPulse.com.au  Fact Sheet for Healthcare Providers: IncredibleEmployment.be  This test is not yet approved or cleared by the [place] FDA and has been authorized for detection and/or diagnosis of SARS-CoV-2 by FDA under an Emergency Use Authorization (EUA). This EUA will remain in effect (meaning this test can be used) for the duration of the COVID-19 declaration under  Section 564(b)(1) of the Act, 21 U.S.C. section 360bbb-3(b)(1), unless the authorization is terminated or revoked.  Performed at [facility], [address], [place], [place] [address]          Radiology Studies: DG Abdomen 1 View  Result Date: 08/28/2021 CLINICAL DATA:  Status post lithotripsy. EXAM: ABDOMEN - 1 VIEW COMPARISON:  08/26/2021 FINDINGS: Previously demonstrated right renal calculus is not clearly visible on the current study. Calcification in the right pelvis could indicate  stone fragments within the bladder or distal ureter. Nonobstructive bowel gas pattern. IMPRESSION: Previously demonstrated right renal calculus not clearly visible on the current study. Calcification in the right pelvis could indicate stone fragments within the bladder or distal ureter. Electronically Signed   By: Ulyses Jarred M.D.   On: 08/28/2021 03:58   US RENAL  Result Date: 08/28/2021 CLINICAL DATA:  Nephrolithiasis.  Recent lithotripsy. EXAM: RENAL / URINARY TRACT ULTRASOUND COMPLETE COMPARISON:  Radiographs, 08/20/2021 at 3:33 a.m.  CT, 08/25/2021. FINDINGS: Right Kidney: Renal measurements: 11.0 x 6.7 x 6.2 cm = volume: 239.4 mL. Normal parenchymal echogenicity. Mild hydronephrosis. No renal mass or visualized stone. Left Kidney: Renal measurements: 12.6 x 6.9 x 7.2 cm = volume: 325 mL. Normal parenchymal echogenicity. Midpole cyst measuring 3.4 x 2.8 x 4.3 cm. Dilated renal pelvis. No solid masses or visualized stones. No calyceal dilation. Bladder: Echogenic dependent material consistent with debris and stone fragments. No wall thickening. No convincing mass. Other: None. IMPRESSION: 1. Mild right hydronephrosis. The 1.3 cm stone noted at the right ureteropelvic junction on the prior CT was not visualized sonographically. There is echogenic material in the dependent bladder consistent with stone fragments. 2. 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 Stable left renal cyst. Electronically Signed   By: Lajean Manes M.D.   On: 08/28/2021 10:07        Scheduled Meds:  enoxaparin (LOVENOX) injection  0.5 mg/kg Subcutaneous Q24H   ketorolac  15 mg Intravenous Q6H   losartan  25 mg Oral Daily   pantoprazole  40 mg Oral Daily   tamsulosin  0.4 mg Oral Daily   Continuous Infusions:  0.9 % NaCl with KCl 20 mEq / L 100 mL/hr at 08/29/21 1017   cefTRIAXone (ROCEPHIN)  IV 1 g (08/29/21 0845)     LOS: 0 days    Time spent: 50  minutes    Sidney Ace, MD [facility]   If 7PM-7AM, please contact night-coverage  08/29/2021, 2:45 PM

## 2021-08-30 ENCOUNTER — Other Ambulatory Visit: Payer: Self-pay

## 2021-08-30 ENCOUNTER — Encounter: Payer: Self-pay | Admitting: Internal Medicine

## 2021-08-30 ENCOUNTER — Inpatient Hospital Stay: Payer: Managed Care, Other (non HMO)

## 2021-08-30 ENCOUNTER — Encounter
Admission: EM | Disposition: A | Discharge status: Home / Self Care | Payer: Self-pay | Source: Home / Self Care | Attending: Internal Medicine

## 2021-08-30 ENCOUNTER — Inpatient Hospital Stay: Payer: Managed Care, Other (non HMO) | Admitting: Anesthesiology

## 2021-08-30 DIAGNOSIS — R112 Nausea with vomiting, unspecified: Secondary | ICD-10-CM | POA: Diagnosis not present

## 2021-08-30 DIAGNOSIS — N201 Calculus of ureter: Secondary | ICD-10-CM

## 2021-08-30 DIAGNOSIS — N1330 Unspecified hydronephrosis: Secondary | ICD-10-CM

## 2021-08-30 HISTORY — PX: CYSTOSCOPY/URETEROSCOPY/HOLMIUM LASER/STENT PLACEMENT: SHX6546

## 2021-08-30 HISTORY — PX: CYSTOSCOPY/RETROGRADE/URETEROSCOPY/STONE EXTRACTION WITH BASKET: SHX5317

## 2021-08-30 LAB — URINE CULTURE: Culture: <10000 — AB

## 2021-08-30 SURGERY — CYSTOSCOPY/URETEROSCOPY/HOLMIUM LASER/STENT PLACEMENT
Anesthesia: General | Laterality: Right

## 2021-08-30 MED ORDER — HYDROMORPHONE HCL 1 MG/ML IJ SOLN
INTRAMUSCULAR | Status: DC | PRN
  Administered 2021-08-30: 1 mg via INTRAVENOUS

## 2021-08-30 MED ORDER — DEXAMETHASONE SODIUM PHOSPHATE 10 MG/ML IJ SOLN
INTRAMUSCULAR | Status: DC | PRN
  Administered 2021-08-30: 10 mg via INTRAVENOUS

## 2021-08-30 MED ORDER — ONDANSETRON HCL 4 MG/2ML IJ SOLN: 4.0000 mg | Freq: Once | INTRAMUSCULAR | Status: DC | PRN

## 2021-08-30 MED ORDER — CEFAZOLIN SODIUM-DEXTROSE 2-4 GM/100ML-% IV SOLN
2.0000 g | INTRAVENOUS | Status: AC
  Administered 2021-08-30: 2 g via INTRAVENOUS

## 2021-08-30 MED ORDER — PROPOFOL 10 MG/ML IV BOLUS
INTRAVENOUS | Status: DC | PRN
Start: 2021-08-30 — End: 2021-08-30
  Administered 2021-08-30: 400 mg via INTRAVENOUS

## 2021-08-30 MED ORDER — LACTATED RINGERS IV SOLN: INTRAVENOUS | Status: DC | PRN

## 2021-08-30 MED ORDER — IOHEXOL 180 MG/ML  SOLN
INTRAMUSCULAR | Status: DC | PRN
  Administered 2021-08-30: 10 mL

## 2021-08-30 MED ORDER — LIDOCAINE HCL (CARDIAC) PF 100 MG/5ML IV SOSY
PREFILLED_SYRINGE | INTRAVENOUS | Status: DC | PRN
  Administered 2021-08-30: 50 mg via INTRAVENOUS

## 2021-08-30 MED ORDER — PHENYLEPHRINE HCL (PRESSORS) 10 MG/ML IV SOLN
INTRAVENOUS | Status: DC | PRN
  Administered 2021-08-30: 300 ug via INTRAVENOUS

## 2021-08-30 MED ORDER — ONDANSETRON HCL 4 MG/2ML IJ SOLN
INTRAMUSCULAR | Status: DC | PRN
  Administered 2021-08-30: 4 mg via INTRAVENOUS

## 2021-08-30 MED ORDER — CEFAZOLIN SODIUM-DEXTROSE 2-4 GM/100ML-% IV SOLN
INTRAVENOUS | Status: AC
  Filled 2021-08-30: qty 100

## 2021-08-30 MED ORDER — HYDROMORPHONE HCL 1 MG/ML IJ SOLN
INTRAMUSCULAR | Status: AC
  Filled 2021-08-30: qty 1

## 2021-08-30 MED ORDER — FENTANYL CITRATE (PF) 100 MCG/2ML IJ SOLN: 25.0000 ug | INTRAMUSCULAR | Status: DC | PRN

## 2021-08-30 MED ORDER — EPHEDRINE SULFATE (PRESSORS) 50 MG/ML IJ SOLN
INTRAMUSCULAR | Status: DC | PRN
  Administered 2021-08-30: 10 mg via INTRAVENOUS

## 2021-08-30 MED ORDER — SODIUM CHLORIDE 0.9 % IR SOLN
Status: DC | PRN
  Administered 2021-08-30: 1000 mL via INTRAVESICAL

## 2021-08-30 SURGICAL SUPPLY — 31 items
ADHESIVE MASTISOL STRL (MISCELLANEOUS) ×1 IMPLANT
BAG DRAIN CYSTO-URO LG1000N (MISCELLANEOUS) ×3 IMPLANT
BASKET LASER NITINOL 1.9FR (BASKET) ×1 IMPLANT
BASKET ZERO TIP 1.9FR (BASKET) ×1 IMPLANT
BRUSH SCRUB EZ 1% IODOPHOR (MISCELLANEOUS) ×3 IMPLANT
CATH URET FLEX-TIP 2 LUMEN 10F (CATHETERS) IMPLANT
CATH URETL OPEN 5X70 (CATHETERS) ×3 IMPLANT
CNTNR SPEC 2.5X3XGRAD LEK (MISCELLANEOUS)
CONT SPEC 4OZ STER OR WHT (MISCELLANEOUS)
CONTAINER SPEC 2.5X3XGRAD LEK (MISCELLANEOUS) IMPLANT
DRAPE UTILITY 15X26 TOWEL STRL (DRAPES) ×3 IMPLANT
DRSG TEGADERM 2-3/8X2-3/4 SM (GAUZE/BANDAGES/DRESSINGS) ×1 IMPLANT
GAUZE 4X4 16PLY ~~LOC~~+RFID DBL (SPONGE) ×5 IMPLANT
GLOVE SURG ENC MOIS LTX SZ6.5 (GLOVE) ×4 IMPLANT
GOWN STRL REUS W/ TWL LRG LVL3 (GOWN DISPOSABLE) ×4 IMPLANT
GOWN STRL REUS W/TWL LRG LVL3 (GOWN DISPOSABLE) ×2
GUIDEWIRE GREEN .038 145CM (MISCELLANEOUS) IMPLANT
GUIDEWIRE STR DUAL SENSOR (WIRE) ×3 IMPLANT
INFUSOR MANOMETER BAG 3000ML (MISCELLANEOUS) ×3 IMPLANT
IV NS IRRIG 3000ML ARTHROMATIC (IV SOLUTION) ×3 IMPLANT
KIT TURNOVER CYSTO (KITS) ×3 IMPLANT
MANIFOLD NEPTUNE II (INSTRUMENTS) ×3 IMPLANT
PACK CYSTO AR (MISCELLANEOUS) ×3 IMPLANT
SET CYSTO W/LG BORE CLAMP LF (SET/KITS/TRAYS/PACK) ×3 IMPLANT
SHEATH URETERAL 12FRX35CM (MISCELLANEOUS) IMPLANT
STENT URET 6FRX24 CONTOUR (STENTS) ×1 IMPLANT
STENT URET 6FRX26 CONTOUR (STENTS) IMPLANT
SURGILUBE 2OZ TUBE FLIPTOP (MISCELLANEOUS) ×3 IMPLANT
TRACTIP FLEXIVA PULSE ID 200 (Laser) ×3 IMPLANT
WATER STERILE IRR 1000ML POUR (IV SOLUTION) ×3 IMPLANT
WATER STERILE IRR 500ML POUR (IV SOLUTION) ×3 IMPLANT

## 2021-08-30 NOTE — Op Note (Signed)
Date of procedure: 08/30/21  Preoperative diagnosis:  Right distal ureteral calculi/right Steinstrasse Right flank pain  Postoperative diagnosis:  Same as above   Procedure: Right ureteroscopy laser lithotripsy Basket extraction of stone fragment Right retrograde pyelogram Right ureteral stent placement Interpretation fluoroscopy less than 30 minutes  Surgeon: Hollice Espy, MD  Anesthesia: General  Complications: None  Intraoperative findings: Multiple, approximately 15 small stone fragments largest large at the UO with some periureteral edema.  All fragments fragmented further and removed via nitinol basket.  No residual fragments remain.  Some mild edema and superficial mucosal irritation but otherwise no extravasation uncomplicated procedure.  Stent placed on tether.  EBL: Minimal  Drains: 6 x 24 French double-J ureteral stent on right with tether   Indication: [name] is a 51 y.o. patient with right distal ureteral Steinstrasse.  After reviewing the management options for treatment, he elected to proceed with the above surgical procedure(s). We have discussed the potential benefits and risks of the procedure, side effects of the proposed treatment, the likelihood of the patient achieving the goals of the procedure, and any potential problems that might occur during the procedure or recuperation. Informed consent has been obtained.  Description of procedure:  The patient was taken to the operating room and general anesthesia was induced.  The patient was placed in the dorsal lithotomy position, prepped and draped in the usual sterile fashion, and preoperative antibiotics were administered. A preoperative time-out was performed.   A 21 French the scope was advanced per urethra into the bladder.  The right UO bladder mucosa surrounding this was slightly hyperemic and edematous.  The UO was mildly dilated with some stone material seen just at the mouth.  I was able to  navigate a sensor wire up to level of the kidney around the stone material which was snapped in place as a safety wire.  I then brought in a semirigid ureteroscope.  I first tried to basket the leading stone unsuccessfully.  I brought in a 242 micro laser fiber and using dusting settings of 0.2 J and 60 Hz, I dusted some of the stone material.  Upon doing so, some of the smaller stone fragments were able to escape out the distal ureter.  I then advanced the scope further, fragmented additional stones and then eventually brought in a 1.9 tipless nitinol basket to remove each and every residual stone burden.  There was some edema at the distal ureter along with some slight mucosal abrasions but nothing significant.  Finally, retrograde pyelogram was injected through the scope at a distal location and no contrast extravasation was appreciated.  I did advance the scope all the way up to the proximal ureter as well and no additional stones retropulsed material was identified.  Finally the scope was removed.  A 6 x 24 French double-J ureteral stent was advanced fluoroscopically up to the level of the renal pelvis.  Upon wire withdrawal, there is a full coil noted within the renal pelvis as well as within the bladder.  The stent string was left attached to the distal coil of the stent.  The patient was then cleaned and dried.  The stent string was affixed to the patient's glans using Mastisol and Tegaderm.  He was then taken to the PACU in stable condition after being repositioned to supine position.  Plan: Plan was communicated with hospitalist.  He will leave his stent on a string for a week and remove it next Monday.  We will plan  for follow-up as an outpatient with renal ultrasound in a month.  Hollice Espy, M.D.

## 2021-08-30 NOTE — Anesthesia Procedure Notes (Signed)
Procedure Name: LMA Insertion Date/Time: 08/30/2021 3:07 PM Performed by: Philbert Riser, CRNA Pre-anesthesia Checklist: Patient identified, Patient being monitored, Timeout performed, Emergency Drugs available and Suction available Patient Re-evaluated:Patient Re-evaluated prior to induction Oxygen Delivery Method: Circle system utilized Preoxygenation: Pre-oxygenation with 100% oxygen Induction Type: IV induction Ventilation: Mask ventilation without difficulty LMA: LMA inserted Tube type: Oral Tube size: 4.5 mm Number of attempts: 1 Placement Confirmation: positive ETCO2 and breath sounds checked- equal and bilateral Tube secured with: Tape Dental Injury: Teeth and Oropharynx as per pre-operative assessment

## 2021-08-30 NOTE — Discharge Instructions (Signed)
You have a ureteral stent in place.  This is a tube that extends from your kidney to your bladder.  This may cause urinary bleeding, burning with urination, and urinary frequency.  Please call our office or present to the ED if you develop fevers >101 or pain which is not able to be controlled with oral pain medications.  You may be given either Flomax and/ or ditropan to help with bladder spasms and stent pain in addition to pain medications.    Your stent is on a string and its attached to the head of your penis.  1 week from placement (next Monday) the stent string can be untaped and pulled into the entire stent is removed.  If the stent is removed prematurely, please let our office know but this is not an emergency.  [facility] [address] [place], [place] [address] [phone]

## 2021-08-30 NOTE — Anesthesia Preprocedure Evaluation (Signed)
Anesthesia Evaluation  Patient identified by MRN, date of birth, ID band Patient awake    Reviewed: Allergy & Precautions, NPO status , Patient's Chart, lab work & pertinent test results  Airway Mallampati: II  TM Distance: >3 FB Neck ROM: full    Dental  (+) Teeth Intact   Pulmonary neg pulmonary ROS, sleep apnea ,    Pulmonary exam normal        Cardiovascular Exercise Tolerance: Good hypertension, Pt. on medications negative cardio ROS Normal cardiovascular exam Rhythm:Regular Rate:Normal     Neuro/Psych negative neurological ROS  negative psych ROS   GI/Hepatic negative GI ROS, Neg liver ROS, GERD  Medicated,  Endo/Other  negative endocrine ROS  Renal/GU Renal disease  negative genitourinary   Musculoskeletal negative musculoskeletal ROS (+)   Abdominal Normal abdominal exam  (+)   Peds negative pediatric ROS (+)  Hematology negative hematology ROS (+)   Anesthesia Other Findings Past Medical History: No date: Acid reflux No date: GERD (gastroesophageal reflux disease) No date: Hematuria No date: Hypertension No date: Kidney stones No date: Sleep apnea     Comment:  HAD GASTRIC SLEEVE AND HAS COME OFF OF CPAP No date: Urolithiasis  Past Surgical History: 08/08/2016: CYSTOSCOPY W/ RETROGRADES; Left     Comment:  Procedure: CYSTOSCOPY WITH RETROGRADE PYELOGRAM;                Surgeon: Hollice Espy, MD;  Location: [facility];                Service: Urology;  Laterality: Left; 03/05/2016: CYSTOSCOPY W/ RETROGRADES; Bilateral     Comment:  Procedure: CYSTOSCOPY WITH RETROGRADE PYELOGRAM;                Surgeon: Nickie Retort, MD;  Location: [facility];                Service: Urology;  Laterality: Bilateral; 03/29/2016: CYSTOSCOPY W/ URETERAL STENT PLACEMENT; Bilateral     Comment:  Procedure: CYSTOSCOPY WITH STENT REPLACEMENT;  Surgeon:               Hollice Espy, MD;  Location: [facility];  Service:                Urology;  Laterality: Bilateral; 08/08/2016: CYSTOSCOPY W/ URETERAL STENT REMOVAL; Left     Comment:  Procedure: CYSTOSCOPY WITH STENT REMOVAL;  Surgeon:               Hollice Espy, MD;  Location: [facility];  Service:               Urology;  Laterality: Left; 07/18/2016: CYSTOSCOPY WITH STENT PLACEMENT; Left     Comment:  Procedure: CYSTOSCOPY WITH STENT PLACEMENT;  Surgeon:               Hollice Espy, MD;  Location: [facility];  Service:               Urology;  Laterality: Left; 03/05/2016: CYSTOSCOPY WITH STENT PLACEMENT; Bilateral     Comment:  Procedure: CYSTOSCOPY WITH STENT PLACEMENT;  Surgeon:               Nickie Retort, MD;  Location: [facility];  Service:               Urology;  Laterality: Bilateral; 07/18/2016: CYSTOSCOPY/RETROGRADE/URETEROSCOPY; Left     Comment:  Procedure: CYSTOSCOPY/RETROGRADE/URETEROSCOPY;  Surgeon:              Hollice Espy,  MD;  Location: [facility];  Service:               Urology;  Laterality: Left; 03/03/2016: EXTRACORPOREAL SHOCK WAVE LITHOTRIPSY; Right     Comment:  Procedure: EXTRACORPOREAL SHOCK WAVE LITHOTRIPSY (ESWL);              Surgeon: Hollice Espy, MD;  Location: [facility];                Service: Urology;  Laterality: Right; 08/26/2021: EXTRACORPOREAL SHOCK WAVE LITHOTRIPSY; Right     Comment:  Procedure: EXTRACORPOREAL SHOCK WAVE LITHOTRIPSY (ESWL);              Surgeon: Billey Co, MD;  Location: [facility];                Service: Urology;  Laterality: Right; No date: KNEE SURGERY 2014: [place] RESECTION 06/07/2018: SEPTOPLASTY; Bilateral     Comment:  Procedure: SEPTOPLASTY;  Surgeon: Margaretha Sheffield, MD;                Location: [place];  Service: ENT;                Laterality: Bilateral; 07/18/2016: STONE EXTRACTION WITH BASKET; Left     Comment:  Procedure: STONE EXTRACTION WITH BASKET;  Surgeon:               Hollice Espy, MD;  Location: [facility];  Service:               Urology;   Laterality: Left; 06/07/2018: TURBINATE REDUCTION; Bilateral     Comment:  Procedure: INFERIOR TURBINATE REDUCTION;  Surgeon:               Margaretha Sheffield, MD;  Location: [place];                Service: ENT;  Laterality: Bilateral; 03/29/2016: URETEROSCOPY; Left     Comment:  Procedure: URETEROSCOPY;  Surgeon: Hollice Espy, MD;                Location: [facility];  Service: Urology;  Laterality: Left; 03/29/2016: URETEROSCOPY WITH HOLMIUM LASER LITHOTRIPSY; Right     Comment:  Procedure: URETEROSCOPY WITH HOLMIUM LASER LITHOTRIPSY;               Surgeon: Hollice Espy, MD;  Location: [facility];                Service: Urology;  Laterality: Right;  BMI    Body Mass Index: 38.75 kg/m      Reproductive/Obstetrics negative OB ROS                             Anesthesia Physical Anesthesia Plan  ASA: 3  Anesthesia Plan: General   Post-op Pain Management:    Induction: Intravenous  PONV Risk Score and Plan: Dexamethasone, Ondansetron, Midazolam and Treatment may vary due to age or medical condition  Airway Management Planned: LMA  Additional Equipment:   Intra-op Plan:   Post-operative Plan: Extubation in OR  Informed Consent: I have reviewed the patients History and Physical, chart, labs and discussed the procedure including the risks, benefits and alternatives for the proposed anesthesia with the patient or authorized representative who has indicated his/her understanding and acceptance.     Dental Advisory Given  Plan Discussed with: CRNA and Surgeon  Anesthesia Plan Comments:  Anesthesia Quick Evaluation

## 2021-08-30 NOTE — Consult Note (Signed)
Urology Consult  I have been asked to see the patient by Dr. Priscella Mann, for evaluation and management of right ureteral stone.  Chief Complaint: Right flank pain/ n/ v  History of Present Illness: [name] is a 51 y.o. year old male with a personal history of nephrolithiasis who underwent right ESWL with Dr. Diamantina Providence on 08/26/2021 which was uncomplicated.  Unfortunately, he developed severe right flank pain and refractory nausea and vomiting and ended up being readmitted to the hospitalist service 08/28/21.  Initially, renal ultrasound showed some mild hydronephrosis and no obvious residual stone burden on KUB and his pain improved however he had an acute worsening.  Urology was called yesterday afternoon at which time a CT stone protocol was ordered.  CT stone completed yesterday evening indicates right distal ureteral Steinstrasse with significant fragmentation of the stone accumulating in the distal ureter and at the right UO.  There is persistent hydronephrosis.  He is a known personal history of kidney stones and is undergone multiple procedures in the past including ureteroscopy.  He continues have pain this morning.  No fevers or chills.  No indication of infection.  Past Medical History:  Diagnosis Date   Acid reflux    GERD (gastroesophageal reflux disease)    Hematuria    Hypertension    Kidney stones    Sleep apnea    HAD GASTRIC SLEEVE AND HAS COME OFF OF CPAP   Urolithiasis     Past Surgical History:  Procedure Laterality Date   CYSTOSCOPY W/ RETROGRADES Left 08/08/2016   Procedure: CYSTOSCOPY WITH RETROGRADE PYELOGRAM;  Surgeon: Hollice Espy, MD;  Location: [facility];  Service: Urology;  Laterality: Left;   CYSTOSCOPY W/ RETROGRADES Bilateral 03/05/2016   Procedure: CYSTOSCOPY WITH RETROGRADE PYELOGRAM;  Surgeon: Nickie Retort, MD;  Location: [facility];  Service: Urology;  Laterality: Bilateral;   CYSTOSCOPY W/ URETERAL STENT PLACEMENT Bilateral 03/29/2016    Procedure: CYSTOSCOPY WITH STENT REPLACEMENT;  Surgeon: Hollice Espy, MD;  Location: [facility];  Service: Urology;  Laterality: Bilateral;   CYSTOSCOPY W/ URETERAL STENT REMOVAL Left 08/08/2016   Procedure: CYSTOSCOPY WITH STENT REMOVAL;  Surgeon: Hollice Espy, MD;  Location: [facility];  Service: Urology;  Laterality: Left;   CYSTOSCOPY WITH STENT PLACEMENT Left 07/18/2016   Procedure: CYSTOSCOPY WITH STENT PLACEMENT;  Surgeon: Hollice Espy, MD;  Location: [facility];  Service: Urology;  Laterality: Left;   CYSTOSCOPY WITH STENT PLACEMENT Bilateral 03/05/2016   Procedure: CYSTOSCOPY WITH STENT PLACEMENT;  Surgeon: Nickie Retort, MD;  Location: [facility];  Service: Urology;  Laterality: Bilateral;   CYSTOSCOPY/RETROGRADE/URETEROSCOPY Left 07/18/2016   Procedure: CYSTOSCOPY/RETROGRADE/URETEROSCOPY;  Surgeon: Hollice Espy, MD;  Location: [facility];  Service: Urology;  Laterality: Left;   EXTRACORPOREAL SHOCK WAVE LITHOTRIPSY Right 03/03/2016   Procedure: EXTRACORPOREAL SHOCK WAVE LITHOTRIPSY (ESWL);  Surgeon: Hollice Espy, MD;  Location: [facility];  Service: Urology;  Laterality: Right;   EXTRACORPOREAL SHOCK WAVE LITHOTRIPSY Right 08/26/2021   Procedure: EXTRACORPOREAL SHOCK WAVE LITHOTRIPSY (ESWL);  Surgeon: Billey Co, MD;  Location: [facility];  Service: Urology;  Laterality: Right;   KNEE SURGERY     LAPAROSCOPIC GASTRIC SLEEVE RESECTION  2014   SEPTOPLASTY Bilateral 06/07/2018   Procedure: SEPTOPLASTY;  Surgeon: Margaretha Sheffield, MD;  Location: [place];  Service: ENT;  Laterality: Bilateral;   STONE EXTRACTION WITH BASKET Left 07/18/2016   Procedure: STONE EXTRACTION WITH BASKET;  Surgeon: Hollice Espy, MD;  Location: [facility];  Service: Urology;  Laterality: Left;   TURBINATE REDUCTION Bilateral 06/07/2018   Procedure: INFERIOR TURBINATE REDUCTION;  Surgeon: Margaretha Sheffield, MD;  Location: [place];  Service: ENT;  Laterality: Bilateral;   URETEROSCOPY Left 03/29/2016    Procedure: URETEROSCOPY;  Surgeon: Hollice Espy, MD;  Location: [facility];  Service: Urology;  Laterality: Left;   URETEROSCOPY WITH HOLMIUM LASER LITHOTRIPSY Right 03/29/2016   Procedure: URETEROSCOPY WITH HOLMIUM LASER LITHOTRIPSY;  Surgeon: Hollice Espy, MD;  Location: [facility];  Service: Urology;  Laterality: Right;    Home Medications:  Current Meds  Medication Sig   losartan (COZAAR) 25 MG tablet Take 25 mg by mouth daily.   omeprazole (PRILOSEC) 20 MG capsule Take 20 mg by mouth at bedtime.    ondansetron (ZOFRAN) 4 MG tablet Take 1 tablet (4 mg total) by mouth daily as needed for nausea or vomiting.   oxycodone (OXY-IR) 5 MG capsule Take 1 capsule (5 mg total) by mouth every 4 (four) hours as needed.   tamsulosin (FLOMAX) 0.4 MG CAPS capsule Take 1 capsule (0.4 mg total) by mouth daily for 14 days.    Allergies:  Allergies  Allergen Reactions   Penicillins Itching and Rash    Has patient had a PCN reaction causing immediate rash, facial/tongue/throat swelling, SOB or lightheadedness with hypotension: No Has patient had a PCN reaction causing severe rash involving mucus membranes or skin necrosis: No Has patient had a PCN reaction that required hospitalization No Has patient had a PCN reaction occurring within the last 10 years: No If all of the above answers are "NO", then may proceed with Cephalosporin use.    Family History  Problem Relation Age of Onset   Prostate cancer Father    Kidney disease Mother    Kidney cancer Neg Hx    Bladder Cancer Neg Hx     Social History:  reports that he has never smoked. He has never used smokeless tobacco. He reports current alcohol use. He reports that he does not use drugs.  ROS: A complete review of systems was performed.  All systems are negative except for pertinent findings as noted.  Physical Exam:  Vital signs in last 24 hours: Temp:  [98.2 F (36.8 C)-98.7 F (37.1 C)] 98.4 F (36.9 C) (01/30 0543) Pulse Rate:   [71-96] 71 (01/30 1009) Resp:  [16-18] 18 (01/30 1009) BP: (120-156)/(76-96) 127/84 (01/30 1009) SpO2:  [95 %-97 %] 97 % (01/30 1009) Constitutional:  Alert and oriented, No acute distress HEENT: [place] AT, moist mucus membranes.  Trachea midline, no masses GI: Abdomen is soft, nontender, nondistended, no abdominal masses GU: + R CVA and RLQ tenderness Lymph: No cervical or inguinal adenopathy Neurologic: Grossly intact, no focal deficits, moving all 4 extremities Psychiatric: Normal mood and affect   Laboratory Data:  Recent Labs    08/28/21 0311 08/29/21 0446  WBC 11.7* 10.3  HGB 12.2* 10.9*  HCT 39.8 34.9*   Recent Labs    08/28/21 0311 08/29/21 0446  NA 137 135  K 3.0* 3.6  CL 102 104  CO2 23 24  GLUCOSE 176* 102*  BUN 13 12  CREATININE 1.07 1.18  CALCIUM 9.1 9.0     Radiologic Imaging: CT RENAL STONE STUDY  Result Date: 08/29/2021 CLINICAL DATA:  History of right renal pelvic stone with recent lithotripsy EXAM: CT ABDOMEN AND PELVIS WITHOUT CONTRAST TECHNIQUE: Multidetector CT imaging of the abdomen and pelvis was performed following the standard protocol without IV contrast. RADIATION DOSE REDUCTION: This exam was  performed according to the departmental dose-optimization program which includes automated exposure control, adjustment of the mA and/or kV according to patient size and/or use of iterative reconstruction technique. COMPARISON:  CT from 08/25/2021, renal ultrasound from 08/28/2021 FINDINGS: Lower chest: No acute abnormality. Hepatobiliary: No focal liver abnormality is seen. Status post cholecystectomy. No biliary dilatation. Pancreas: Unremarkable. No pancreatic ductal dilatation or surrounding inflammatory changes. Spleen: Normal in size without focal abnormality. Adrenals/Urinary Tract: Adrenal glands are within normal limits. Left kidney again demonstrates multiple renal cysts stable in appearance from the prior exam. No obstructive changes are seen.  Nonobstructing small right renal stone is noted. Persistent right-sided hydronephrosis is seen which extends distally into the right ureter. 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 Bladder is partially distended. Stomach/Bowel: No obstructive or inflammatory changes of colon are noted. The appendix is within normal limits. Small bowel and stomach are unremarkable with the exception of a moderate-sized sliding-type hiatal hernia. 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 Vascular/Lymphatic: Aortic atherosclerosis. No enlarged abdominal or pelvic lymph nodes. Reproductive: Prostate is unremarkable. Other: No abdominal wall hernia or abnormality. No abdominopelvic ascites. Musculoskeletal: Degenerative changes of lumbar spine are noted. No acute bony abnormality is noted. IMPRESSION: Interval lithotripsy of previously seen right renal pelvic stone with multiple Steinstrasse fragments in the distal right ureter with persistent hydronephrosis. Nonobstructing right renal stone. Moderate-sized hiatal hernia stable from prior exam. Electronically Signed   By: Inez Catalina M.D.   On: 08/29/2021 19:04  \  CT scan along with KUB and renal ultrasound all were personally reviewed today.  Agree with radiologic interpretation.  Impression/ Plan:  Right distal ureteral calculus/Steinstrasse/ right hydronephrosis/ refractory right flank pain- Based on the size of the stones, its possible that he may spontaneously passed these however he continues to have severe refractory pain necessitating inpatient hospital admission and IV pain medication.  Options were discussed in detail including continued observation versus ureteral stent versus ureteroscopy with laser lithotripsy amongst others.  He is most interested in ureteroscopy.  Risks and benefits of ureteroscopy were reviewed including but  not limited to infection, bleeding, pain, ureteral injury which could require open surgery versus prolonged indwelling if ureteral perforation occurs, persistent stone disease, requirement for staged procedure, possible stent, and global anesthesia risks. Patient expressed understanding and desires to proceed with ureteroscopy.  He is currently NPO.  Antibiotics on-call to the OR.  Plan for add-on procedure later today.  08/30/2021, 12:45 PM  Hollice Espy,  MD

## 2021-08-30 NOTE — Progress Notes (Signed)
08/30/21  Urology brief consult note, full consult to follow  51 year old male status post ESWL Thursday admitted over the weekend with severe right flank pain nausea vomiting requiring IV narcotics.  He initially improved but yesterday, his pain worsened.  Ultimately, he underwent a noncontrast stone scan yesterday evening indicating right distal ureteral Steinstrasse with proximal hydronephrosis.  Options were discussed at bedside this morning.  He elected to proceed with right ureteroscopy, laser lithotripsy and stent placement later today.  He should remain n.p.o.  Risk and benefits were discussed including risk of bleeding, infection, damage surrounding structures, need for further procedures, stent pain amongst others.  All questions were answered.  Hollice Espy, MD

## 2021-08-30 NOTE — Progress Notes (Signed)
PROGRESS NOTE    [name]  QIO:[id number] DOB: 10-05-1970 DOA: 08/28/2021 PCP: Dione Housekeeperlmedo, Mario Ernesto, MD    Brief Narrative:   51 year old male who had a recent lithotripsy for obstructing nephrolithiasis on 1/26.  Presents to the ED today due to back pain radiating to flanks and lower abdomen.  In significant pain on admission.  Required several doses of IV narcotic.  Patient had recurrent waves of pain associated with nausea and vomiting over interval.  Urinalysis unrevealing.  Patient continued to have pain.  Case discussed with urology.  Recommend noncontrast CT stone study.  Stone study revealed stone fragment at right ureterovesicular junction.  Urology saw patient in follow-up.  After discussion patient elected to proceed with right ureteroscopy, laser lithotripsy and ureteral stent placement.  Procedure plan 1/30     Assessment & Plan:   Principal Problem:   Intractable nausea and vomiting Active Problems:   Intractable pain  Intractable back and lower abdominal pain Intractable nausea and vomiting Status post right lithotripsy for 1.3 cm stone Patient had lithotripsy on 1/26 Post procedurally had recurrence of lower back and abdominal pain associated with nausea and vomiting Initially was responding to conservative therapy Had recurrence of pain KUB and renal ultrasound show stone fragments in the bladder without evidence of 1.3 cm right ureteral stone UA with no evidence of infection 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  Retained stone fragment noted on imaging.  Plan for ureteroscopy with laser lithotripsy and stent placement today   Level of care: Med-Surg  Consultants:  Urology  Procedures:  None  Antimicrobials:   Subjective: Seen and examined.  Pain control improved overnight.  Plan for procedure today.  Objective: Vitals:   08/29/21 1606 08/29/21 2035 08/30/21 0543 08/30/21 1009  BP: (!) 156/96 131/83 120/76 127/84  Pulse: 96 89 89 71  Resp: 18 16 16 18   Temp: 98.7 F (37.1 C) 98.2 F (36.8 C) 98.4 F (36.9 C)   TempSrc: Oral Oral Oral   SpO2: 97% 95% 95% 97%  Weight:      Height:        Intake/Output Summary (Last 24 hours) at 08/30/2021 1022 Last data filed at 08/30/2021 0650 Gross per 24 hour  Intake 3101.57 ml  Output 700 ml  Net 2401.57 ml   Filed Weights   08/28/21 0304  Weight: 108.9 kg    Examination:  General exam: No apparent distress.  Sitting up in chair Respiratory system: Clear to auscultation. Respiratory effort normal. Cardiovascular system: S1-S2, RRR, no murmurs, no pedal edema Gastrointestinal system: Soft, nondistended, tender to palpation lower abdomen, normal bowel sounds Central nervous system: Alert and oriented. No focal neurological deficits. Extremities: Symmetric 5 x 5 power. Skin: No rashes, lesions or ulcers Psychiatry: Judgement and insight appear normal. Mood & affect appropriate.     Data Reviewed: I have personally reviewed following labs and imaging studies  CBC: Recent Labs  Lab 08/25/21 1713 08/28/21 0311 08/29/21 0446  WBC 10.4 11.7* 10.3  NEUTROABS  --  8.6*  --   HGB 12.6* 12.2* 10.9*  HCT 41.6 39.8 34.9*  MCV 70.6* 70.2* 68.6*  PLT 443* 430* 386   Basic Metabolic Panel: Recent Labs  Lab 08/25/21 1713 08/28/21 0311 08/29/21 0446  NA 137 137 135  K 3.4*  3.0* 3.6  CL 102 102 104  CO2 26 23 24   GLUCOSE 120* 176* 102*  BUN 16 13 12   CREATININE 1.00 1.07 1.18  CALCIUM 9.2 9.1 9.0   GFR: Estimated Creatinine Clearance: 84.7 mL/min (by C-G formula based on SCr of 1.18 mg/dL). Liver Function Tests: Recent Labs  Lab 08/25/21 1713  AST 21  ALT 23  ALKPHOS 69  BILITOT 0.5  PROT 7.2  ALBUMIN 3.8   Recent Labs  Lab 08/25/21 1713  LIPASE 29   No results for input(s): AMMONIA in the last 168 hours. Coagulation Profile: No results for input(s): INR, PROTIME in the last 168 hours. Cardiac Enzymes: No results for input(s): CKTOTAL, CKMB, CKMBINDEX, TROPONINI in the last 168 hours. BNP (last 3 results) No results for input(s): PROBNP in the last 8760 hours. HbA1C: No results for input(s): HGBA1C in the last 72 hours. CBG: No results for input(s): GLUCAP in the last 168 hours. Lipid Profile: No results for input(s): CHOL, HDL, LDLCALC, TRIG, CHOLHDL, LDLDIRECT in the last 72 hours. Thyroid Function Tests: No results for input(s): TSH, T4TOTAL, FREET4, T3FREE, THYROIDAB in the last 72 hours. Anemia Panel: No results for input(s): VITAMINB12, FOLATE, FERRITIN, TIBC, IRON, RETICCTPCT in the last 72 hours. Sepsis Labs: No results for input(s): PROCALCITON, LATICACIDVEN in the last 168 hours.  Recent Results (from the past 240 hour(s))  Resp Panel by RT-PCR (Flu A&B, Covid) Nasopharyngeal Swab     Status: None   Collection Time: 08/28/21  6:24 AM   Specimen: Nasopharyngeal Swab; Nasopharyngeal(NP) swabs in vial transport medium  Result Value Ref Range Status   SARS Coronavirus 2 by RT PCR NEGATIVE NEGATIVE Final    Comment: (NOTE) SARS-CoV-2 target nucleic acids are NOT DETECTED.  The SARS-CoV-2 RNA is generally detectable in upper respiratory specimens during the acute phase of infection. The lowest concentration of SARS-CoV-2 viral copies this assay can detect is 138 copies/mL. A negative result does not preclude  SARS-Cov-2 infection and should not be used as the sole basis for treatment or other patient management decisions. A negative result may occur with  improper specimen collection/handling, submission of specimen other than nasopharyngeal swab, presence of viral mutation(s) within the areas targeted by this assay, and inadequate number of viral copies(<138 copies/mL). A negative result must be combined with clinical observations, patient history, and epidemiological information. The expected result is Negative.  Fact Sheet for Patients:  08/27/21  Fact Sheet for Healthcare Providers:  08/30/21  This test is no t yet approved or cleared by the [employer] FDA and  has been authorized for detection and/or diagnosis of SARS-CoV-2 by FDA under an Emergency Use Authorization (EUA). This EUA will remain  in effect (meaning this test can be used) for the duration of the COVID-19 declaration under Section 564(b)(1) of the Act, 21 U.S.C.section 360bbb-3(b)(1), unless the authorization is terminated  or revoked sooner.       Influenza A by PCR NEGATIVE NEGATIVE Final   Influenza B by PCR NEGATIVE NEGATIVE Final    Comment: (NOTE) The Xpert Xpress SARS-CoV-2/FLU/RSV plus assay is intended as an aid in the diagnosis of influenza from Nasopharyngeal swab specimens and should not be used as a sole basis  for treatment. Nasal washings and aspirates are unacceptable for Xpert Xpress SARS-CoV-2/FLU/RSV testing.  Fact Sheet for Patients: [employer]  Fact Sheet for Healthcare Providers: [employer]  This test is not yet approved or cleared by the [place] FDA and has been authorized for detection and/or diagnosis of SARS-CoV-2 by FDA under an Emergency Use Authorization (EUA). This EUA will remain in effect (meaning this test can be used) for the duration of  the COVID-19 declaration under Section 564(b)(1) of the Act, 21 U.S.C. section 360bbb-3(b)(1), unless the authorization is terminated or revoked.  Performed at [facility], [address], [place], [place] [address]   Urine Culture     Status: Abnormal   Collection Time: 08/28/21  7:50 AM   Specimen: Urine, Random  Result Value Ref Range Status   Specimen Description   Final    URINE, RANDOM Performed at [facility], [address], [place], [place] [address]    Special Requests   Final    NONE Performed at [facility], [address] [place], [place] [address]    Culture (A)  Final    <10,000 COLONIES/mL INSIGNIFICANT GROWTH Performed at [facility], [address], [place], [place] [address]    Report Status 08/30/2021 FINAL  Final         Radiology Studies: CT RENAL STONE STUDY  Result Date: 08/29/2021 CLINICAL DATA:  History of right renal pelvic stone with recent lithotripsy EXAM: CT ABDOMEN AND PELVIS WITHOUT CONTRAST TECHNIQUE: Multidetector CT imaging of the abdomen and pelvis was performed following the standard protocol without IV contrast. RADIATION DOSE REDUCTION: This exam was performed according to the departmental dose-optimization program which includes automated exposure control, adjustment of the mA and/or kV according to patient size and/or use of iterative reconstruction technique. COMPARISON:  CT from 08/25/2021, renal ultrasound from 08/28/2021 FINDINGS: Lower chest: No acute abnormality. Hepatobiliary: No focal liver abnormality is seen. Status post cholecystectomy. No biliary dilatation. Pancreas: Unremarkable. No pancreatic ductal dilatation or surrounding inflammatory changes. Spleen: Normal in size without focal abnormality. Adrenals/Urinary Tract: Adrenal glands are within normal limits. Left kidney again demonstrates multiple renal cysts stable in appearance from the prior exam. No obstructive changes are seen.  Nonobstructing small right renal stone is noted. Persistent right-sided hydronephrosis is seen which extends distally into the right ureter. 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 Bladder is partially distended. Stomach/Bowel: No obstructive or inflammatory changes of colon are noted. The appendix is within normal limits. Small bowel and stomach are unremarkable with the exception of a moderate-sized sliding-type hiatal hernia. 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 Vascular/Lymphatic: Aortic atherosclerosis. No enlarged abdominal or pelvic lymph nodes. Reproductive: Prostate is unremarkable. Other: No abdominal wall hernia or abnormality. No abdominopelvic ascites. Musculoskeletal: Degenerative changes of lumbar spine are noted. No acute bony abnormality is noted. IMPRESSION: Interval lithotripsy of previously seen right renal pelvic stone with multiple Steinstrasse fragments in the distal right ureter with persistent hydronephrosis. Nonobstructing right renal stone. Moderate-sized hiatal hernia stable from prior exam. Electronically Signed   By: Alcide Clever M.D.   On: 08/29/2021 19:04        Scheduled Meds:  enoxaparin (LOVENOX) injection  0.5 mg/kg Subcutaneous Q24H   ketorolac  15 mg Intravenous Q6H   losartan  25 mg Oral Daily   pantoprazole  40 mg Oral Daily   tamsulosin  0.4 mg  Oral Daily   Continuous Infusions:  0.9 % NaCl with KCl 20 mEq / L 100 mL/hr at 08/29/21 2350    ceFAZolin (ANCEF) IV       LOS: 1 day    Time spent: 35 minutes    Tresa MooreSudheer B Glynn Freas, MD [facility]   If 7PM-7AM, please contact night-coverage  08/30/2021, 10:22 AM

## 2021-08-30 NOTE — Transfer of Care (Signed)
Immediate Anesthesia Transfer of Care Note  Patient: [name]  Procedure(s) Performed: CYSTOSCOPY/URETEROSCOPY/HOLMIUM LASER/STENT PLACEMENT (Right) CYSTOSCOPY/RETROGRADE/URETEROSCOPY/STONE EXTRACTION WITH BASKET  Patient Location: PACU  Anesthesia Type:General  Level of Consciousness: drowsy  Airway & Oxygen Therapy: Patient Spontanous Breathing  Post-op Assessment: Report given to RN  Post vital signs: Reviewed and stable  Last Vitals:  Vitals Value Taken Time  BP 112/78 08/30/21 1615  Temp 36.2 C 08/30/21 1626  Pulse 100 08/30/21 1627  Resp 12 08/30/21 1627  SpO2 96 % 08/30/21 1627  Vitals shown include unvalidated device data.  Last Pain:  Vitals:   08/30/21 1626  TempSrc:   PainSc: 0-No pain         Complications: No notable events documented.

## 2021-08-31 ENCOUNTER — Other Ambulatory Visit: Payer: Self-pay

## 2021-08-31 ENCOUNTER — Encounter: Payer: Self-pay | Admitting: Urology

## 2021-08-31 DIAGNOSIS — N201 Calculus of ureter: Secondary | ICD-10-CM

## 2021-08-31 DIAGNOSIS — R112 Nausea with vomiting, unspecified: Secondary | ICD-10-CM | POA: Diagnosis not present

## 2021-08-31 MED ORDER — OXYBUTYNIN CHLORIDE ER 10 MG PO TB24: 10.0000 mg | ORAL_TABLET | Freq: Every day | ORAL | 0 refills | Status: AC | PRN

## 2021-08-31 NOTE — Discharge Summary (Signed)
Physician Discharge Summary  [name] ZOX:[id number] DOB: 1971-06-26 DOA: 08/28/2021  PCP: Dione Housekeeper, MD  Admit date: 08/28/2021 Discharge date: 08/31/2021  Admitted From: Home Disposition: Home  Recommendations for Outpatient Follow-up:  Follow up with PCP in 1-2 weeks Follow-up with urology next Monday  Home Health: No Equipment/Devices: None  Discharge Condition: Stable CODE STATUS: Full Diet recommendation: Regular  Brief/Interim Summary:  51 year old male who had a recent lithotripsy for obstructing nephrolithiasis on 1/26.  Presents to the ED today due to back pain radiating to flanks and lower abdomen.  In significant pain on admission.  Required several doses of IV narcotic.   Patient had recurrent waves of pain associated with nausea and vomiting over interval.  Urinalysis unrevealing.   Patient continued to have pain.  Case discussed with urology.  Recommend noncontrast CT stone study.  Stone study revealed stone fragment at right ureterovesicular junction.  Urology saw patient in follow-up.  After discussion patient elected to proceed with right ureteroscopy, laser lithotripsy and ureteral stent placement.   Procedure completed successfully 1/30.  Stent to remain in place.  Patient will follow-up with urology on Monday post discharge for discontinuation of stent.  At time of discharge will ensure patient has prescriptions for Flomax, narcotics, oxybutynin.    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  Follow-up in urology clinic on Monday to 623.  Continue Flomax daily, oxybutynin as needed, narcotics as needed.  Stable for discharge home.   Essential hypertension PTA losartan    GERD PPI   Hypokalemia Resolved  Discharge Instructions  Discharge Instructions     Diet - low sodium heart healthy   Complete by: As directed    Increase activity slowly   Complete by: As directed    No wound care   Complete by: As directed       Allergies as of 08/31/2021       Reactions   Penicillins Itching, Rash   Has patient had a PCN reaction causing immediate rash, facial/tongue/throat swelling, SOB or lightheadedness with hypotension: No Has patient had a PCN reaction causing severe rash involving mucus membranes or skin necrosis: No Has patient had a PCN reaction that required hospitalization No Has patient had a PCN reaction occurring within the last 10 years: No If all of the above answers are "NO", then may proceed with Cephalosporin use.        Medication List     TAKE these medications    losartan 25 MG tablet Commonly known as: COZAAR Take 25 mg by mouth daily.   omeprazole 20 MG capsule Commonly known as: PRILOSEC Take 20 mg by mouth at bedtime.   ondansetron 4 MG tablet Commonly known as: Zofran Take 1 tablet (4 mg total) by mouth daily as needed for nausea or vomiting.   oxybutynin 10 MG 24 hr tablet Commonly known as: Ditropan XL  Take 1 tablet (10 mg total) by mouth daily as needed for up to 14 days.   oxycodone 5 MG capsule Commonly known as: OXY-IR Take 1 capsule (5 mg total) by mouth every 4 (four) hours as needed.   tamsulosin 0.4 MG Caps capsule Commonly known as: FLOMAX Take 1 capsule (0.4  mg total) by mouth daily for 14 days.        Follow-up Information     Vanna Scotland, MD. Go on 09/13/2021.   Specialty: Urology Why: @ 2p in [place]   Needs to to an XRAY done before appt can just walk in on the same day Contact information: [address] [place] [address] [phone]                Allergies  Allergen Reactions   Penicillins Itching and Rash    Has patient had a PCN reaction causing immediate rash, facial/tongue/throat swelling, SOB or lightheadedness with hypotension: No Has patient had a PCN reaction causing severe rash involving mucus membranes or skin necrosis: No Has patient had a PCN reaction that required hospitalization No Has patient had a PCN reaction occurring within the last 10 years: No If all of the above answers are "NO", then may proceed with Cephalosporin use.    Consultations: Urology   Procedures/Studies: DG Abdomen 1 View  Result Date: 08/28/2021 CLINICAL DATA:  Status post lithotripsy. EXAM: ABDOMEN - 1 VIEW COMPARISON:  08/26/2021 FINDINGS: Previously demonstrated right renal calculus is not clearly visible on the current study. Calcification in the right pelvis could indicate stone fragments within the bladder or distal ureter. Nonobstructive bowel gas pattern. IMPRESSION: Previously demonstrated right renal calculus not clearly visible on the current study. Calcification in the right pelvis could indicate stone fragments within the bladder or distal ureter. Electronically Signed   By: Deatra Robinson M.D.   On: 08/28/2021 03:58   Abdomen 1 view (KUB)  Result Date: 08/27/2021 CLINICAL DATA:  Follow-up right-sided UPJ stone. EXAM: ABDOMEN - 1 VIEW COMPARISON:  CT without contrast 08/25/2021 FINDINGS: The bowel gas pattern is normal. Again noted is an ovoid 1.8 x 0.8 cm right UPJ stone at the level of the distal right L2 transverse process, unchanged in positioning. A 2 mm nonobstructing caliceal stone in the  superior pole right kidney was visible on CT but is not radiographically visible. No new pathologic calcification is seen. Cholecystectomy clips are again shown. No acute osseous findings. Lung bases are clear. IMPRESSION: No interval change in position of the right UPJ stone. Electronically Signed   By: Almira Bar M.D.   On: 08/27/2021 01:24   US RENAL  Result Date: 08/28/2021 CLINICAL DATA:  Nephrolithiasis.  Recent lithotripsy. EXAM: RENAL / URINARY TRACT ULTRASOUND COMPLETE COMPARISON:  Radiographs, 08/20/2021 at 3:33 a.m.  CT, 08/25/2021. FINDINGS: Right Kidney: Renal measurements: 11.0 x 6.7 x 6.2 cm = volume: 239.4 mL. Normal parenchymal echogenicity. Mild hydronephrosis. No renal mass or visualized stone. Left Kidney: Renal measurements: 12.6 x 6.9 x 7.2 cm = volume: 325 mL. Normal parenchymal echogenicity. Midpole cyst measuring 3.4 x 2.8 x 4.3 cm. Dilated renal pelvis. No solid masses or visualized stones. No calyceal dilation. Bladder: Echogenic dependent material consistent with debris and stone fragments. No wall thickening. No convincing mass. Other: None. IMPRESSION: 1. Mild right hydronephrosis. The 1.3 cm stone noted at the right ureteropelvic junction on the prior CT was not visualized sonographically. There is echogenic material in the dependent bladder consistent with stone fragments.  2. 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 Stable left renal cyst. Electronically Signed   By: Amie Portlandavid  Ormond M.D.   On: 08/28/2021 10:07   DG OR UROLOGY CYSTO IMAGE ([facility] ONLY)  Result Date: 08/30/2021 There is no interpretation for this exam.  This order is for images obtained during a surgical procedure.  Please See "Surgeries" Tab for more information regarding the procedure.   CT RENAL STONE STUDY  Result Date: 08/29/2021 CLINICAL DATA:  History of right renal pelvic stone with recent lithotripsy EXAM: CT ABDOMEN AND PELVIS WITHOUT CONTRAST  TECHNIQUE: Multidetector CT imaging of the abdomen and pelvis was performed following the standard protocol without IV contrast. RADIATION DOSE REDUCTION: This exam was performed according to the departmental dose-optimization program which includes automated exposure control, adjustment of the mA and/or kV according to patient size and/or use of iterative reconstruction technique. COMPARISON:  CT from 08/25/2021, renal ultrasound from 08/28/2021 FINDINGS: Lower chest: No acute abnormality. Hepatobiliary: No focal liver abnormality is seen. Status post cholecystectomy. No biliary dilatation. Pancreas: Unremarkable. No pancreatic ductal dilatation or surrounding inflammatory changes. Spleen: Normal in size without focal abnormality. Adrenals/Urinary Tract: Adrenal glands are within normal limits. Left kidney again demonstrates multiple renal cysts stable in appearance from the prior exam. No obstructive changes are seen. Nonobstructing small right renal stone is noted. Persistent right-sided hydronephrosis is seen which extends distally into the right ureter. 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 Bladder is partially distended. Stomach/Bowel: No obstructive or inflammatory changes of colon are noted. The appendix is within normal limits. Small bowel and stomach are unremarkable with the exception of a moderate-sized sliding-type hiatal hernia. 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 Vascular/Lymphatic: Aortic atherosclerosis. No enlarged abdominal or pelvic lymph nodes. Reproductive: Prostate is unremarkable. Other: No abdominal wall hernia or abnormality. No abdominopelvic ascites. Musculoskeletal: Degenerative changes of lumbar spine are noted. No acute bony abnormality is noted. IMPRESSION: Interval lithotripsy of previously seen right renal pelvic stone with multiple  Steinstrasse fragments in the distal right ureter with persistent hydronephrosis. Nonobstructing right renal stone. Moderate-sized hiatal hernia stable from prior exam. Electronically Signed   By: Alcide CleverMark  Lukens M.D.   On: 08/29/2021 19:04   CT Renal Stone Study  Result Date: 08/25/2021 CLINICAL DATA:  Flank pain, kidney stone suspected Patient reports right flank pain, onset last night, progressive. History of kidney stones. EXAM: CT ABDOMEN AND PELVIS WITHOUT CONTRAST TECHNIQUE: Multidetector CT imaging of the abdomen and pelvis was performed following the standard protocol without IV contrast. RADIATION DOSE REDUCTION: This exam was performed according to the departmental dose-optimization program which includes automated exposure control, adjustment of the mA and/or kV according to patient size and/or use of iterative reconstruction technique. COMPARISON:  CT 06/27/2016 FINDINGS: Lower chest: Hypoventilatory changes in the lung bases. Small to moderate-sized hiatal hernia. Gastric suture line which crosses the diaphragmatic hiatus. Calcified granuloma in the right lower lobe. Hepatobiliary: Mild hepatic steatosis. No focal liver lesion on this unenhanced exam. Clips in the gallbladder fossa postcholecystectomy. No biliary dilatation. Pancreas: No ductal dilatation or inflammation. Spleen: Normal in size without focal abnormality. Adrenals/Urinary Tract: Normal adrenal glands. Obstructing 11 x 13 mm stone at the right ureteropelvic junction with mild right hydronephrosis. Mild right perinephric edema. The right ureter is otherwise decompressed without distal ureteral stone. There is a punctate nonobstructing stone in the upper right  kidney. Small cortical cyst in the lower right kidney. Extrarenal pelvis configuration versus parapelvic cyst in the left kidney. There is no left hydronephrosis or left renal calculi. Decompressed left ureter. Multiple cysts in the lower left kidney, incompletely characterized on  this unenhanced exam. The urinary bladder is near completely empty. No bladder stone. 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 No small bowel obstruction or inflammatory change. Diminutive appendix tentatively visualized small volume of colonic stool. Vascular/Lymphatic: Normal caliber abdominal aorta. Minimal aortic atherosclerosis. Circumaortic left renal vein. No portal venous or mesenteric gas. No bulky abdominopelvic lymph nodes. Reproductive: Prostate is unremarkable. Other: No free air, free fluid, or intra-abdominal fluid collection. Tiny fat containing umbilical hernia. Musculoskeletal: Stable hemangioma within L3 vertebral body. Lower lumbar facet hypertrophy. There are no acute or suspicious osseous abnormalities. IMPRESSION: 1. Obstructing 11 x 13 mm stone at the right ureteropelvic junction with mild right hydronephrosis. 2. Punctate nonobstructing stone in the upper right kidney. 3. Mild hepatic steatosis. 4. Small to moderate hiatal hernia. Prior gastric sleeve with gastric suture line crossing the diaphragmatic hiatus Electronically Signed   By: Narda RutherfordMelanie  Sanford M.D.   On: 08/25/2021 18:06      Subjective: Seen and examined on the day of discharge.  Stable no distress.  Pain-free.  Fever free.  Discharge Exam: Vitals:   08/31/21 0438 08/31/21 0838  BP: 120/81 (!) 147/90  Pulse: (!) 59 72  Resp: 18 17  Temp: 97.9 F (36.6 C) 98 F (36.7 C)  SpO2: 95% 97%   Vitals:   08/30/21 2013 08/31/21 0133 08/31/21 0438 08/31/21 0838  BP: 117/89 113/84 120/81 (!) 147/90  Pulse: 97 82 (!) 59 72  Resp: 18 18 18 17   Temp: 98.2 F (36.8 C) 98 F (36.7 C) 97.9 F (36.6 C) 98 F (36.7 C)  TempSrc: Oral Oral Oral Oral  SpO2: 99% 96% 95% 97%  Weight:      Height:        General: Pt is alert, awake, not in acute distress Cardiovascular: RRR, S1/S2 +, no rubs, no gallops Respiratory: CTA bilaterally, no  wheezing, no rhonchi Abdominal: Soft, NT, ND, bowel sounds + Extremities: no edema, no cyanosis    The results of significant diagnostics from this hospitalization (including imaging, microbiology, ancillary and laboratory) are listed below for reference.     Microbiology: Recent Results (from the past 240 hour(s))  Resp Panel by RT-PCR (Flu A&B, Covid) Nasopharyngeal Swab     Status: None   Collection Time: 08/28/21  6:24 AM   Specimen: Nasopharyngeal Swab; Nasopharyngeal(NP) swabs in vial transport medium  Result Value Ref Range Status   SARS Coronavirus 2 by RT PCR NEGATIVE NEGATIVE Final    Comment: (NOTE) SARS-CoV-2 target nucleic acids are NOT DETECTED.  The SARS-CoV-2 RNA is generally detectable in upper respiratory specimens during the acute phase of infection. The lowest concentration of SARS-CoV-2 viral copies this assay can detect is 138 copies/mL. A negative result does not preclude SARS-Cov-2 infection and should not be used as the sole basis for treatment or other patient management decisions. A negative result may occur with  improper specimen collection/handling, submission of specimen other than nasopharyngeal swab, presence of viral mutation(s) within the areas targeted by this assay, and inadequate number of viral copies(<138 copies/mL). A negative result must be combined with clinical observations, patient history, and epidemiological information. The expected result is Negative.  Fact Sheet for Patients:  BloggerCourse.comhttps://www.fda.gov/media/152166/download  Fact Sheet for Healthcare Providers:  SeriousBroker.it  This test is no t yet approved or cleared by the [place] FDA and  has been authorized for detection and/or diagnosis of SARS-CoV-2 by FDA under an Emergency Use Authorization (EUA). This EUA will remain  in effect (meaning this test can be used) for the duration of the COVID-19 declaration under Section 564(b)(1) of the Act,  21 U.S.C.section 360bbb-3(b)(1), unless the authorization is terminated  or revoked sooner.       Influenza A by PCR NEGATIVE NEGATIVE Final   Influenza B by PCR NEGATIVE NEGATIVE Final    Comment: (NOTE) The Xpert Xpress SARS-CoV-2/FLU/RSV plus assay is intended as an aid in the diagnosis of influenza from Nasopharyngeal swab specimens and should not be used as a sole basis for treatment. Nasal washings and aspirates are unacceptable for Xpert Xpress SARS-CoV-2/FLU/RSV testing.  Fact Sheet for Patients: BloggerCourse.com  Fact Sheet for Healthcare Providers: SeriousBroker.it  This test is not yet approved or cleared by the [place] FDA and has been authorized for detection and/or diagnosis of SARS-CoV-2 by FDA under an Emergency Use Authorization (EUA). This EUA will remain in effect (meaning this test can be used) for the duration of the COVID-19 declaration under Section 564(b)(1) of the Act, 21 U.S.C. section 360bbb-3(b)(1), unless the authorization is terminated or revoked.  Performed at [facility], [address] [place], [place] [address]   Urine Culture     Status: Abnormal   Collection Time: 08/28/21  7:50 AM   Specimen: Urine, Random  Result Value Ref Range Status   Specimen Description   Final    URINE, RANDOM Performed at [facility], [address] [place], [place] [address]    Special Requests   Final    NONE Performed at [facility], [address] [place], [place] [address]    Culture (A)  Final    <10,000 COLONIES/mL INSIGNIFICANT GROWTH Performed at [facility], [address] [place], [place] [address]    Report Status 08/30/2021 FINAL  Final     Labs: BNP (last 3 results) No results for input(s): BNP in the last 8760 hours. Basic Metabolic Panel: Recent Labs  Lab 08/25/21 1713 08/28/21 0311 08/29/21 0446  NA 137 137 135  K 3.4* 3.0* 3.6   CL 102 102 104  CO2 26 23 24   GLUCOSE 120* 176* 102*  BUN 16 13 12   CREATININE 1.00 1.07 1.18  CALCIUM 9.2 9.1 9.0   Liver Function Tests: Recent Labs  Lab 08/25/21 1713  AST 21  ALT 23  ALKPHOS 69  BILITOT 0.5  PROT 7.2  ALBUMIN 3.8   Recent Labs  Lab 08/25/21 1713  LIPASE 29   No results for input(s): AMMONIA in the last 168 hours. CBC: Recent Labs  Lab 08/25/21 1713 08/28/21 0311 08/29/21 0446  WBC 10.4 11.7* 10.3  NEUTROABS  --  8.6*  --   HGB 12.6* 12.2* 10.9*  HCT 41.6 39.8 34.9*  MCV 70.6* 70.2* 68.6*  PLT 443* 430* 386   Cardiac Enzymes: No results for input(s): CKTOTAL, CKMB, CKMBINDEX, TROPONINI in the last 168 hours. BNP: Invalid input(s): POCBNP CBG: No results for input(s): GLUCAP in the last 168 hours. D-Dimer No results for input(s): DDIMER in the last 72 hours. Hgb A1c No results for input(s): HGBA1C in the last 72 hours. Lipid Profile No results for input(s): CHOL, HDL, LDLCALC, TRIG, CHOLHDL, LDLDIRECT in the last 72 hours. Thyroid function  studies No results for input(s): TSH, T4TOTAL, T3FREE, THYROIDAB in the last 72 hours.  Invalid input(s): FREET3 Anemia work up No results for input(s): VITAMINB12, FOLATE, FERRITIN, TIBC, IRON, RETICCTPCT in the last 72 hours. Urinalysis    Component Value Date/Time   COLORURINE STRAW (A) 08/28/2021 0624   APPEARANCEUR CLEAR 08/28/2021 0624   APPEARANCEUR Clear 07/17/2017 1100   LABSPEC 1.020 08/28/2021 0624   LABSPEC 1.027 05/23/2013 1933   PHURINE 7.0 08/28/2021 0624   GLUCOSEU 250 (A) 08/28/2021 0624   GLUCOSEU Negative 05/23/2013 1933   HGBUR MODERATE (A) 08/28/2021 0624   BILIRUBINUR NEGATIVE 08/28/2021 0624   BILIRUBINUR Negative 07/17/2017 1100   BILIRUBINUR Negative 05/23/2013 1933   KETONESUR 40 (A) 08/28/2021 0624   PROTEINUR NEGATIVE 08/28/2021 0624   NITRITE NEGATIVE 08/28/2021 0624   LEUKOCYTESUR NEGATIVE 08/28/2021 0624   LEUKOCYTESUR Negative 05/23/2013 1933   Sepsis  Labs Invalid input(s): PROCALCITONIN,  WBC,  LACTICIDVEN Microbiology Recent Results (from the past 240 hour(s))  Resp Panel by RT-PCR (Flu A&B, Covid) Nasopharyngeal Swab     Status: None   Collection Time: 08/28/21  6:24 AM   Specimen: Nasopharyngeal Swab; Nasopharyngeal(NP) swabs in vial transport medium  Result Value Ref Range Status   SARS Coronavirus 2 by RT PCR NEGATIVE NEGATIVE Final    Comment: (NOTE) SARS-CoV-2 target nucleic acids are NOT DETECTED.  The SARS-CoV-2 RNA is generally detectable in upper respiratory specimens during the acute phase of infection. The lowest concentration of SARS-CoV-2 viral copies this assay can detect is 138 copies/mL. A negative result does not preclude SARS-Cov-2 infection and should not be used as the sole basis for treatment or other patient management decisions. A negative result may occur with  improper specimen collection/handling, submission of specimen other than nasopharyngeal swab, presence of viral mutation(s) within the areas targeted by this assay, and inadequate number of viral copies(<138 copies/mL). A negative result must be combined with clinical observations, patient history, and epidemiological information. The expected result is Negative.  Fact Sheet for Patients:  BloggerCourse.com  Fact Sheet for Healthcare Providers:  SeriousBroker.it  This test is no t yet approved or cleared by the [place] FDA and  has been authorized for detection and/or diagnosis of SARS-CoV-2 by FDA under an Emergency Use Authorization (EUA). This EUA will remain  in effect (meaning this test can be used) for the duration of the COVID-19 declaration under Section 564(b)(1) of the Act, 21 U.S.C.section 360bbb-3(b)(1), unless the authorization is terminated  or revoked sooner.       Influenza A by PCR NEGATIVE NEGATIVE Final   Influenza B by PCR NEGATIVE NEGATIVE Final    Comment:  (NOTE) The Xpert Xpress SARS-CoV-2/FLU/RSV plus assay is intended as an aid in the diagnosis of influenza from Nasopharyngeal swab specimens and should not be used as a sole basis for treatment. Nasal washings and aspirates are unacceptable for Xpert Xpress SARS-CoV-2/FLU/RSV testing.  Fact Sheet for Patients: BloggerCourse.com  Fact Sheet for Healthcare Providers: SeriousBroker.it  This test is not yet approved or cleared by the [place] FDA and has been authorized for detection and/or diagnosis of SARS-CoV-2 by FDA under an Emergency Use Authorization (EUA). This EUA will remain in effect (meaning this test can be used) for the duration of the COVID-19 declaration under Section 564(b)(1) of the Act, 21 U.S.C. section 360bbb-3(b)(1), unless the authorization is terminated or revoked.  Performed at [facility], [address], [place], [place] [address]   Urine Culture  Status: Abnormal   Collection Time: 08/28/21  7:50 AM   Specimen: Urine, Random  Result Value Ref Range Status   Specimen Description   Final    URINE, RANDOM Performed at [facility], [address] [place], [place] [address]    Special Requests   Final    NONE Performed at [facility], [address] [place], [place] [address]    Culture (A)  Final    <10,000 COLONIES/mL INSIGNIFICANT GROWTH Performed at [facility], [address], [place], [place] [address]    Report Status 08/30/2021 FINAL  Final     Time coordinating discharge: Over 30 minutes  SIGNED:   Tresa Moore, MD  [facility] 08/31/2021, 12:03 PM Pager   If 7PM-7AM, please contact night-coverage

## 2021-08-31 NOTE — Anesthesia Postprocedure Evaluation (Signed)
Anesthesia Post Note  Patient: [name]  Procedure(s) Performed: CYSTOSCOPY/URETEROSCOPY/HOLMIUM LASER/STENT PLACEMENT (Right) CYSTOSCOPY/RETROGRADE/URETEROSCOPY/STONE EXTRACTION WITH BASKET  Patient location during evaluation: PACU Anesthesia Type: General Level of consciousness: awake and alert Pain management: pain level controlled Vital Signs Assessment: post-procedure vital signs reviewed and stable Respiratory status: spontaneous breathing, nonlabored ventilation and respiratory function stable Cardiovascular status: blood pressure returned to baseline and stable Postop Assessment: no apparent nausea or vomiting Anesthetic complications: no   No notable events documented.   Last Vitals:  Vitals:   08/31/21 0438 08/31/21 0838  BP: 120/81 (!) 147/90  Pulse: (!) 59 72  Resp: 18 17  Temp: 36.6 C 36.7 C  SpO2: 95% 97%    Last Pain:  Vitals:   08/31/21 0838  TempSrc: Oral  PainSc:                  Foye Deer

## 2021-09-03 ENCOUNTER — Telehealth: Payer: Self-pay | Admitting: *Deleted

## 2021-09-03 NOTE — Telephone Encounter (Signed)
Pt calling to advise that he was using the bathroom this morning and the stent came out. Pt stated stent has been in for 4 days and was told to call if stent came out.

## 2021-09-13 ENCOUNTER — Ambulatory Visit: Payer: Managed Care, Other (non HMO) | Admitting: Urology

## 2021-09-13 ENCOUNTER — Ambulatory Visit
Admission: RE | Admit: 2021-09-13 | Discharge: 2021-09-13 | Disposition: A | Discharge status: Home / Self Care | Payer: Managed Care, Other (non HMO) | Source: Ambulatory Visit | Attending: Urology | Admitting: Urology

## 2021-09-13 ENCOUNTER — Other Ambulatory Visit: Payer: Self-pay

## 2021-09-13 DIAGNOSIS — N201 Calculus of ureter: Secondary | ICD-10-CM | POA: Insufficient documentation

## 2021-09-23 NOTE — Progress Notes (Incomplete)
09/23/21 4:15 PM   [name] 30-Mar-1971 [id number]  Referring provider:  [facility], [address] [place],  [place] [address] No chief complaint on file.    HPI: [name] is a 51 y.o.male with a personal history of hematuria, nephrolithiasis, and renal cyst, who presents today for 1 month follow-uo with RUS prior.   He presented to the emergency room on August 25, 2021 with sudden onset of right-sided flank pain associated with nausea and decreased urination.  CT renal stone study noted an obstructing 11 x 13 mm stone at the right UPJ.  SD <1500 HU and SSD < 15 cm.   He is s/p ESWL on 08/26/2021 for right UPJ stone.  He was seen back in the ED on 08/28/2020 for  significant right flank pain. Abdominal x-ray showed right renal calculus not clearly visible and calcification of the right pelvis that could indicate stone fragment within the bladder or distal ureter.  He is s/p Right ureteroscopy laser lithotripsy basket extraction of stone fragment, right retrograde pyelogram , and right ureteral stent placement on 08/30/2021. Intraoperative findings: Multiple, approximately 15 small stone fragments largest large at the UO with some periureteral edema.  All fragments fragmented further and removed via nitinol basket.  No residual fragments remain.  Some mild edema and superficial mucosal irritation but otherwise no extravasation uncomplicated procedure.  Stent placed on tether    PMH: Past Medical History:  Diagnosis Date   Acid reflux    GERD (gastroesophageal reflux disease)    Hematuria    Hypertension    Kidney stones    Sleep apnea    HAD GASTRIC SLEEVE AND HAS COME OFF OF CPAP   Urolithiasis     Surgical History: Past Surgical History:  Procedure Laterality Date   CYSTOSCOPY W/ RETROGRADES Left 08/08/2016   Procedure: CYSTOSCOPY WITH RETROGRADE PYELOGRAM;  Surgeon: Hollice Espy, MD;  Location: [facility];  Service: Urology;  Laterality: Left;    CYSTOSCOPY W/ RETROGRADES Bilateral 03/05/2016   Procedure: CYSTOSCOPY WITH RETROGRADE PYELOGRAM;  Surgeon: Nickie Retort, MD;  Location: [facility];  Service: Urology;  Laterality: Bilateral;   CYSTOSCOPY W/ URETERAL STENT PLACEMENT Bilateral 03/29/2016   Procedure: CYSTOSCOPY WITH STENT REPLACEMENT;  Surgeon: Hollice Espy, MD;  Location: [facility];  Service: Urology;  Laterality: Bilateral;   CYSTOSCOPY W/ URETERAL STENT REMOVAL Left 08/08/2016   Procedure: CYSTOSCOPY WITH STENT REMOVAL;  Surgeon: Hollice Espy, MD;  Location: [facility];  Service: Urology;  Laterality: Left;   CYSTOSCOPY WITH STENT PLACEMENT Left 07/18/2016   Procedure: CYSTOSCOPY WITH STENT PLACEMENT;  Surgeon: Hollice Espy, MD;  Location: [facility];  Service: Urology;  Laterality: Left;   CYSTOSCOPY WITH STENT PLACEMENT Bilateral 03/05/2016   Procedure: CYSTOSCOPY WITH STENT PLACEMENT;  Surgeon: Nickie Retort, MD;  Location: [facility];  Service: Urology;  Laterality: Bilateral;   CYSTOSCOPY/RETROGRADE/URETEROSCOPY Left 07/18/2016   Procedure: CYSTOSCOPY/RETROGRADE/URETEROSCOPY;  Surgeon: Hollice Espy, MD;  Location: [facility];  Service: Urology;  Laterality: Left;   CYSTOSCOPY/RETROGRADE/URETEROSCOPY/STONE EXTRACTION WITH BASKET  08/30/2021   Procedure: CYSTOSCOPY/RETROGRADE/URETEROSCOPY/STONE EXTRACTION WITH BASKET;  Surgeon: Hollice Espy, MD;  Location: [facility];  Service: Urology;;   CYSTOSCOPY/URETEROSCOPY/HOLMIUM LASER/STENT PLACEMENT Right 08/30/2021   Procedure: CYSTOSCOPY/URETEROSCOPY/HOLMIUM LASER/STENT PLACEMENT;  Surgeon: Hollice Espy, MD;  Location: [facility];  Service: Urology;  Laterality: Right;   EXTRACORPOREAL SHOCK WAVE LITHOTRIPSY Right 03/03/2016   Procedure: EXTRACORPOREAL SHOCK WAVE LITHOTRIPSY (ESWL);  Surgeon: Hollice Espy, MD;  Location: [facility];  Service: Urology;  Laterality: Right;   EXTRACORPOREAL SHOCK WAVE LITHOTRIPSY Right 08/26/2021   Procedure: EXTRACORPOREAL SHOCK WAVE LITHOTRIPSY  (ESWL);  Surgeon: Sondra Come, MD;  Location: [facility];  Service: Urology;  Laterality: Right;   KNEE SURGERY     LAPAROSCOPIC GASTRIC SLEEVE RESECTION  2014   SEPTOPLASTY Bilateral 06/07/2018   Procedure: SEPTOPLASTY;  Surgeon: Vernie Murders, MD;  Location: [facility];  Service: ENT;  Laterality: Bilateral;   STONE EXTRACTION WITH BASKET Left 07/18/2016   Procedure: STONE EXTRACTION WITH BASKET;  Surgeon: Vanna Scotland, MD;  Location: [facility];  Service: Urology;  Laterality: Left;   TURBINATE REDUCTION Bilateral 06/07/2018   Procedure: INFERIOR TURBINATE REDUCTION;  Surgeon: Vernie Murders, MD;  Location: [facility];  Service: ENT;  Laterality: Bilateral;   URETEROSCOPY Left 03/29/2016   Procedure: URETEROSCOPY;  Surgeon: Vanna Scotland, MD;  Location: [facility];  Service: Urology;  Laterality: Left;   URETEROSCOPY WITH HOLMIUM LASER LITHOTRIPSY Right 03/29/2016   Procedure: URETEROSCOPY WITH HOLMIUM LASER LITHOTRIPSY;  Surgeon: Vanna Scotland, MD;  Location: [facility];  Service: Urology;  Laterality: Right;    Home Medications:  Allergies as of 09/24/2021       Reactions   Penicillins Itching, Rash   Has patient had a PCN reaction causing immediate rash, facial/tongue/throat swelling, SOB or lightheadedness with hypotension: No Has patient had a PCN reaction causing severe rash involving mucus membranes or skin necrosis: No Has patient had a PCN reaction that required hospitalization No Has patient had a PCN reaction occurring within the last 10 years: No If all of the above answers are "NO", then may proceed with Cephalosporin use.        Medication List        Accurate as of September 23, 2021  4:15 PM. If you have any questions, ask your nurse or doctor.          losartan 25 MG tablet Commonly known as: COZAAR Take 25 mg by mouth daily.   omeprazole 20 MG capsule Commonly known as: PRILOSEC Take 20 mg by mouth at bedtime.   ondansetron 4 MG  tablet Commonly known as: Zofran Take 1 tablet (4 mg total) by mouth daily as needed for nausea or vomiting.   oxycodone 5 MG capsule Commonly known as: OXY-IR Take 1 capsule (5 mg total) by mouth every 4 (four) hours as needed.        Allergies:  Allergies  Allergen Reactions   Penicillins Itching and Rash    Has patient had a PCN reaction causing immediate rash, facial/tongue/throat swelling, SOB or lightheadedness with hypotension: No Has patient had a PCN reaction causing severe rash involving mucus membranes or skin necrosis: No Has patient had a PCN reaction that required hospitalization No Has patient had a PCN reaction occurring within the last 10 years: No If all of the above answers are "NO", then may proceed with Cephalosporin use.    Family History: Family History  Problem Relation Age of Onset   Prostate cancer Father    Kidney disease Mother    Kidney cancer Neg Hx    Bladder Cancer Neg Hx     Social History:  reports that he has never smoked. He has never used smokeless tobacco. He reports current alcohol use. He reports that he does not use drugs.   Physical Exam: There were no vitals taken for this visit.  Constitutional:  Alert and oriented, No acute distress. HEENT: [place] AT, moist mucus membranes.  Trachea midline, no  masses. Cardiovascular: No clubbing, cyanosis, or edema. Respiratory: Normal respiratory effort, no increased work of breathing. Skin: No rashes, bruises or suspicious lesions. Neurologic: Grossly intact, no focal deficits, moving all 4 extremities. Psychiatric: Normal mood and affect.  Laboratory Data:  Lab Results  Component Value Date   CREATININE 1.18 08/29/2021    Lab Results  Component Value Date   HGBA1C 5.7 04/11/2013    Urinalysis   Pertinent Imaging:    Assessment & Plan:     No follow-ups on file.  I,Kailey Littlejohn,acting as a Education administrator for Hollice Espy, MD.,have documented all relevant documentation on  the behalf of Hollice Espy, MD,as directed by  Hollice Espy, MD while in the presence of Hollice Espy, [place] [address] [place], [place] [address] [phone]

## 2021-09-24 ENCOUNTER — Ambulatory Visit (INDEPENDENT_AMBULATORY_CARE_PROVIDER_SITE_OTHER): Payer: Managed Care, Other (non HMO) | Admitting: Urology

## 2021-09-24 ENCOUNTER — Ambulatory Visit: Payer: Managed Care, Other (non HMO) | Admitting: Urology

## 2021-09-24 ENCOUNTER — Other Ambulatory Visit: Payer: Self-pay

## 2021-09-24 VITALS — BP 143/89 | HR 70 | Ht 66.0 in | Wt 240.0 lb

## 2021-09-24 DIAGNOSIS — N201 Calculus of ureter: Secondary | ICD-10-CM

## 2021-09-24 NOTE — Progress Notes (Signed)
09/24/21 12:33 PM   [name] 1971/07/28 [id number]  Referring provider:  [facility], [place],  [place] [address] Chief Complaint  Patient presents with   Results     HPI: ARTIST ZLOTNICK is a 51 y.o.male with a personal history of hematuria, nephrolithiasis, and renal cyst, who presents today for 1 month follow-up with RUS prior.   He has a personal history of kidney stones and is s/p bilateral ureteroscopy and left ureteroscopy in 2017 for nephrolithiasis. Stone composition of 75% calcium oxalate dihydrate, 10% calcium oxalate monohydrate and 50% calcium phosphate.   He underwent a CT renal stone study on 08/25/2021 which noted an obstructing 11 x 13 mm stone at the right UPJ.  SD <1500 HU and SSD < 15 cm.  He is s/p ESWL on 08/26/2021 for this right UPJ stone.   He was seen back in the ED on 08/28/2021 for  significant right flank pain. Abdominal x-ray showed right renal calculus not clearly visible and calcification of the right pelvis that could indicate stone fragment within the bladder or distal ureter.   He is s/p Right ureteroscopy laser lithotripsy basket extraction of stone fragment, right retrograde pyelogram , and right ureteral stent placement on 08/30/2021. Intraoperative findings: Multiple, approximately 15 small stone fragments largest large at the UO with some periureteral edema.  All fragments fragmented further and removed via nitinol basket.  No residual fragments remain.  Some mild edema and superficial mucosal irritation but otherwise no extravasation uncomplicated procedure.  Stent placed on tether  He called clinic on 09/03/2021 reporting that his stent came out when he was using the restroom.   He is doing well today. He reports that he has trouble drinking enough water throughout the day.   PMH: Past Medical History:  Diagnosis Date   Acid reflux    GERD (gastroesophageal reflux disease)    Hematuria    Hypertension     Kidney stones    Sleep apnea    HAD GASTRIC SLEEVE AND HAS COME OFF OF CPAP   Urolithiasis     Surgical History: Past Surgical History:  Procedure Laterality Date   CYSTOSCOPY W/ RETROGRADES Left 08/08/2016   Procedure: CYSTOSCOPY WITH RETROGRADE PYELOGRAM;  Surgeon: Hollice Espy, MD;  Location: [facility];  Service: Urology;  Laterality: Left;   CYSTOSCOPY W/ RETROGRADES Bilateral 03/05/2016   Procedure: CYSTOSCOPY WITH RETROGRADE PYELOGRAM;  Surgeon: Nickie Retort, MD;  Location: [facility];  Service: Urology;  Laterality: Bilateral;   CYSTOSCOPY W/ URETERAL STENT PLACEMENT Bilateral 03/29/2016   Procedure: CYSTOSCOPY WITH STENT REPLACEMENT;  Surgeon: Hollice Espy, MD;  Location: [facility];  Service: Urology;  Laterality: Bilateral;   CYSTOSCOPY W/ URETERAL STENT REMOVAL Left 08/08/2016   Procedure: CYSTOSCOPY WITH STENT REMOVAL;  Surgeon: Hollice Espy, MD;  Location: [facility];  Service: Urology;  Laterality: Left;   CYSTOSCOPY WITH STENT PLACEMENT Left 07/18/2016   Procedure: CYSTOSCOPY WITH STENT PLACEMENT;  Surgeon: Hollice Espy, MD;  Location: [facility];  Service: Urology;  Laterality: Left;   CYSTOSCOPY WITH STENT PLACEMENT Bilateral 03/05/2016   Procedure: CYSTOSCOPY WITH STENT PLACEMENT;  Surgeon: Nickie Retort, MD;  Location: [facility];  Service: Urology;  Laterality: Bilateral;   CYSTOSCOPY/RETROGRADE/URETEROSCOPY Left 07/18/2016   Procedure: CYSTOSCOPY/RETROGRADE/URETEROSCOPY;  Surgeon: Hollice Espy, MD;  Location: [facility];  Service: Urology;  Laterality: Left;   CYSTOSCOPY/RETROGRADE/URETEROSCOPY/STONE EXTRACTION WITH BASKET  08/30/2021   Procedure: CYSTOSCOPY/RETROGRADE/URETEROSCOPY/STONE EXTRACTION WITH BASKET;  Surgeon: Hollice Espy, MD;  Location: [facility];  Service: Urology;;   CYSTOSCOPY/URETEROSCOPY/HOLMIUM LASER/STENT PLACEMENT Right 08/30/2021   Procedure: CYSTOSCOPY/URETEROSCOPY/HOLMIUM LASER/STENT PLACEMENT;  Surgeon: Vanna Scotland, MD;  Location: [facility];   Service: Urology;  Laterality: Right;   EXTRACORPOREAL SHOCK WAVE LITHOTRIPSY Right 03/03/2016   Procedure: EXTRACORPOREAL SHOCK WAVE LITHOTRIPSY (ESWL);  Surgeon: Vanna Scotland, MD;  Location: [facility];  Service: Urology;  Laterality: Right;   EXTRACORPOREAL SHOCK WAVE LITHOTRIPSY Right 08/26/2021   Procedure: EXTRACORPOREAL SHOCK WAVE LITHOTRIPSY (ESWL);  Surgeon: Sondra Come, MD;  Location: [facility];  Service: Urology;  Laterality: Right;   KNEE SURGERY     LAPAROSCOPIC GASTRIC SLEEVE RESECTION  2014   SEPTOPLASTY Bilateral 06/07/2018   Procedure: SEPTOPLASTY;  Surgeon: Vernie Murders, MD;  Location: [facility];  Service: ENT;  Laterality: Bilateral;   STONE EXTRACTION WITH BASKET Left 07/18/2016   Procedure: STONE EXTRACTION WITH BASKET;  Surgeon: Vanna Scotland, MD;  Location: [facility];  Service: Urology;  Laterality: Left;   TURBINATE REDUCTION Bilateral 06/07/2018   Procedure: INFERIOR TURBINATE REDUCTION;  Surgeon: Vernie Murders, MD;  Location: [facility];  Service: ENT;  Laterality: Bilateral;   URETEROSCOPY Left 03/29/2016   Procedure: URETEROSCOPY;  Surgeon: Vanna Scotland, MD;  Location: [facility];  Service: Urology;  Laterality: Left;   URETEROSCOPY WITH HOLMIUM LASER LITHOTRIPSY Right 03/29/2016   Procedure: URETEROSCOPY WITH HOLMIUM LASER LITHOTRIPSY;  Surgeon: Vanna Scotland, MD;  Location: [facility];  Service: Urology;  Laterality: Right;    Home Medications:  Allergies as of 09/24/2021       Reactions   Penicillins Itching, Rash   Has patient had a PCN reaction causing immediate rash, facial/tongue/throat swelling, SOB or lightheadedness with hypotension: No Has patient had a PCN reaction causing severe rash involving mucus membranes or skin necrosis: No Has patient had a PCN reaction that required hospitalization No Has patient had a PCN reaction occurring within the last 10 years: No If all of the above answers are "NO", then may proceed with Cephalosporin  use.        Medication List        Accurate as of September 24, 2021 12:33 PM. If you have any questions, ask your nurse or doctor.          STOP taking these medications    omeprazole 20 MG capsule Commonly known as: PRILOSEC Stopped by: Vanna Scotland, MD   ondansetron 4 MG tablet Commonly known as: Zofran Stopped by: Vanna Scotland, MD   oxycodone 5 MG capsule Commonly known as: OXY-IR Stopped by: Vanna Scotland, MD       TAKE these medications    famotidine 40 MG tablet Commonly known as: PEPCID Take 40 mg by mouth at bedtime.   losartan 25 MG tablet Commonly known as: COZAAR Take 25 mg by mouth daily.        Allergies:  Allergies  Allergen Reactions   Penicillins Itching and Rash    Has patient had a PCN reaction causing immediate rash, facial/tongue/throat swelling, SOB or lightheadedness with hypotension: No Has patient had a PCN reaction causing severe rash involving mucus membranes or skin necrosis: No Has patient had a PCN reaction that required hospitalization No Has patient had a PCN reaction occurring within the last 10 years: No If all of the above answers are "NO", then may proceed with Cephalosporin use.    Family History: Family History  Problem Relation Age of Onset   Prostate cancer Father    Kidney disease Mother  Kidney cancer Neg Hx    Bladder Cancer Neg Hx     Social History:  reports that he has never smoked. He has never used smokeless tobacco. He reports current alcohol use. He reports that he does not use drugs.   Physical Exam: BP (!) 143/89    Pulse 70    Ht 5\' 6"  (1.676 m)    Wt 240 lb (108.9 kg)    BMI 38.74 kg/m   Constitutional:  Alert and oriented, No acute distress. HEENT: [place] AT, moist mucus membranes.  Trachea midline, no masses. Cardiovascular: No clubbing, cyanosis, or edema. Respiratory: Normal respiratory effort, no increased work of breathing. Skin: No rashes, bruises or suspicious  lesions. Neurologic: Grossly intact, no focal deficits, moving all 4 extremities. Psychiatric: Normal mood and affect.  Laboratory Data: Lab Results  Component Value Date   CREATININE 1.18 08/29/2021   Lab Results  Component Value Date   HGBA1C 5.7 04/11/2013   Pertinent Imaging: CLINICAL DATA:  Right urethral stone post ureteroscopic   EXAM: RENAL / URINARY TRACT ULTRASOUND COMPLETE   COMPARISON:  CT 08/29/2021   FINDINGS: Right Kidney:   Renal measurements: 11.4 x 4.8 x 5.1 cm = volume: 147.5 mL. Echogenicity is normal. No hydronephrosis. Cyst at the lower pole measuring 15 mm.   Left Kidney:   Renal measurements: 11.6 x 6.1 x 5.1 cm = volume: 188.5 mL. Echogenicity is normal. No hydronephrosis. Multiple, fewer than 10 cysts in the left kidney. The largest is seen at the upper pole and measures 33 mm.   Bladder:   Appears normal for degree of bladder distention.   Other:   None.   IMPRESSION: 1. Negative for hydronephrosis. 2. Simple appearing renal cysts     Electronically Signed   By: Donavan Foil M.D.   On: 09/13/2021 23:31   Assessment & Plan: Right distal ureteral calculi/right Steinstrasse - S/p ureteroscopy  - RUS was reassuring and showed resolution of kidney stones  -We discussed general stone prevention techniques including drinking plenty water with goal of producing 2.5 L urine daily, increased citric acid intake, avoidance of high oxalate containing foods, and decreased salt intake.  Information about dietary recommendations given today.  - KUB in 1 year to monitor   2. Renal cyst  - Seen on RUS likely benign, unremarkable   Return in 1 year with [facility] [address] [place], [place] [address] [phone]

## 2022-09-26 ENCOUNTER — Other Ambulatory Visit: Payer: Self-pay

## 2022-09-26 DIAGNOSIS — N200 Calculus of kidney: Secondary | ICD-10-CM

## 2022-09-30 ENCOUNTER — Ambulatory Visit
Admission: RE | Admit: 2022-09-30 | Discharge: 2022-09-30 | Disposition: A | Discharge status: Home / Self Care | Payer: Managed Care, Other (non HMO) | Attending: Urology | Admitting: Urology

## 2022-09-30 ENCOUNTER — Ambulatory Visit: Payer: Managed Care, Other (non HMO) | Admitting: Urology

## 2022-09-30 ENCOUNTER — Ambulatory Visit
Admission: RE | Admit: 2022-09-30 | Discharge: 2022-09-30 | Disposition: A | Discharge status: Home / Self Care | Payer: Managed Care, Other (non HMO) | Source: Ambulatory Visit | Attending: Urology | Admitting: Urology

## 2022-09-30 VITALS — BP 139/87 | HR 68 | Ht 66.0 in | Wt 250.0 lb

## 2022-09-30 DIAGNOSIS — Z87442 Personal history of urinary calculi: Secondary | ICD-10-CM | POA: Diagnosis not present

## 2022-09-30 DIAGNOSIS — N200 Calculus of kidney: Secondary | ICD-10-CM

## 2022-09-30 DIAGNOSIS — N281 Cyst of kidney, acquired: Secondary | ICD-10-CM

## 2022-09-30 NOTE — Progress Notes (Signed)
Haze Rushing Plume,acting as a scribe for Hollice Espy, MD.,have documented all relevant documentation on the behalf of Hollice Espy, MD,as directed by  Hollice Espy, MD while in the presence of Hollice Espy, MD.  09/30/2022 3:31 PM   [name] 01-04-1971 [id number]  Referring provider: Valera Castle, MD [address] [place],  [place] [address]  Chief Complaint  Patient presents with   renal cyst    HPI: 52 year-old male with a personal history of kidney stones who returns to follow up with KUB. He is status post previous ureteroscopy as well as ESWL, complicated by [place]strasse. His most recent procedure was a little over a year ago.   He had a CT abdomen on 09/26/2022 at [facility] and this was reviewed by me today. It indicated multiple bilateral cysts, including a 2 cm exophytic left renal pole lesion along with other very small hypo-attenuating lesions, as well as a large left perinephric cyst. The radiologist had equivocal enhancement, it went from 35 to 46 width in the venous phase, which makes it technically indeterminate. Notably, he has had multiple cysts on previous imaging in the past, all of which appear to be essentially simple cysts.   KUB was personally reviewed today but has not been interpreted by radiology. He has no obvious stone disease bilaterally.   He notes that he is hydrating well.   PMH: Past Medical History:  Diagnosis Date   Acid reflux    GERD (gastroesophageal reflux disease)    Hematuria    Hypertension    Kidney stones    Sleep apnea    HAD GASTRIC SLEEVE AND HAS COME OFF OF CPAP   Urolithiasis     Surgical History: Past Surgical History:  Procedure Laterality Date   CYSTOSCOPY W/ RETROGRADES Left 08/08/2016   Procedure: CYSTOSCOPY WITH RETROGRADE PYELOGRAM;  Surgeon: Hollice Espy, MD;  Location: [facility];  Service: Urology;  Laterality: Left;   CYSTOSCOPY W/ RETROGRADES Bilateral 03/05/2016   Procedure: CYSTOSCOPY WITH  RETROGRADE PYELOGRAM;  Surgeon: Nickie Retort, MD;  Location: [facility];  Service: Urology;  Laterality: Bilateral;   CYSTOSCOPY W/ URETERAL STENT PLACEMENT Bilateral 03/29/2016   Procedure: CYSTOSCOPY WITH STENT REPLACEMENT;  Surgeon: Hollice Espy, MD;  Location: [facility];  Service: Urology;  Laterality: Bilateral;   CYSTOSCOPY W/ URETERAL STENT REMOVAL Left 08/08/2016   Procedure: CYSTOSCOPY WITH STENT REMOVAL;  Surgeon: Hollice Espy, MD;  Location: [facility];  Service: Urology;  Laterality: Left;   CYSTOSCOPY WITH STENT PLACEMENT Left 07/18/2016   Procedure: CYSTOSCOPY WITH STENT PLACEMENT;  Surgeon: Hollice Espy, MD;  Location: [facility];  Service: Urology;  Laterality: Left;   CYSTOSCOPY WITH STENT PLACEMENT Bilateral 03/05/2016   Procedure: CYSTOSCOPY WITH STENT PLACEMENT;  Surgeon: Nickie Retort, MD;  Location: [facility];  Service: Urology;  Laterality: Bilateral;   CYSTOSCOPY/RETROGRADE/URETEROSCOPY Left 07/18/2016   Procedure: CYSTOSCOPY/RETROGRADE/URETEROSCOPY;  Surgeon: Hollice Espy, MD;  Location: [facility];  Service: Urology;  Laterality: Left;   CYSTOSCOPY/RETROGRADE/URETEROSCOPY/STONE EXTRACTION WITH BASKET  08/30/2021   Procedure: CYSTOSCOPY/RETROGRADE/URETEROSCOPY/STONE EXTRACTION WITH BASKET;  Surgeon: Hollice Espy, MD;  Location: [facility];  Service: Urology;;   CYSTOSCOPY/URETEROSCOPY/HOLMIUM LASER/STENT PLACEMENT Right 08/30/2021   Procedure: CYSTOSCOPY/URETEROSCOPY/HOLMIUM LASER/STENT PLACEMENT;  Surgeon: Hollice Espy, MD;  Location: [facility];  Service: Urology;  Laterality: Right;   EXTRACORPOREAL SHOCK WAVE LITHOTRIPSY Right 03/03/2016   Procedure: EXTRACORPOREAL SHOCK WAVE LITHOTRIPSY (ESWL);  Surgeon: Hollice Espy, MD;  Location: [facility];  Service: Urology;  Laterality: Right;   EXTRACORPOREAL  SHOCK WAVE LITHOTRIPSY Right 08/26/2021   Procedure: EXTRACORPOREAL SHOCK WAVE LITHOTRIPSY (ESWL);  Surgeon: Billey Co, MD;  Location: [facility];  Service: Urology;   Laterality: Right;   KNEE SURGERY     LAPAROSCOPIC GASTRIC SLEEVE RESECTION  2014   SEPTOPLASTY Bilateral 06/07/2018   Procedure: SEPTOPLASTY;  Surgeon: Margaretha Sheffield, MD;  Location: [facility];  Service: ENT;  Laterality: Bilateral;   STONE EXTRACTION WITH BASKET Left 07/18/2016   Procedure: STONE EXTRACTION WITH BASKET;  Surgeon: Hollice Espy, MD;  Location: [facility];  Service: Urology;  Laterality: Left;   TURBINATE REDUCTION Bilateral 06/07/2018   Procedure: INFERIOR TURBINATE REDUCTION;  Surgeon: Margaretha Sheffield, MD;  Location: [facility];  Service: ENT;  Laterality: Bilateral;   URETEROSCOPY Left 03/29/2016   Procedure: URETEROSCOPY;  Surgeon: Hollice Espy, MD;  Location: [facility];  Service: Urology;  Laterality: Left;   URETEROSCOPY WITH HOLMIUM LASER LITHOTRIPSY Right 03/29/2016   Procedure: URETEROSCOPY WITH HOLMIUM LASER LITHOTRIPSY;  Surgeon: Hollice Espy, MD;  Location: [facility];  Service: Urology;  Laterality: Right;    Home Medications:  Allergies as of 09/30/2022       Reactions   Penicillins Itching, Rash   Has patient had a PCN reaction causing immediate rash, facial/tongue/throat swelling, SOB or lightheadedness with hypotension: No Has patient had a PCN reaction causing severe rash involving mucus membranes or skin necrosis: No Has patient had a PCN reaction that required hospitalization No Has patient had a PCN reaction occurring within the last 10 years: No If all of the above answers are "NO", then may proceed with Cephalosporin use.        Medication List        Accurate as of September 30, 2022  3:31 PM. If you have any questions, ask your nurse or doctor.          famotidine 40 MG tablet Commonly known as: PEPCID Take 40 mg by mouth at bedtime.   losartan 25 MG tablet Commonly known as: COZAAR Take 25 mg by mouth daily.   omeprazole 40 MG capsule Commonly known as: PRILOSEC Take 40 mg by mouth daily.        Allergies:   Allergies  Allergen Reactions   Penicillins Itching and Rash    Has patient had a PCN reaction causing immediate rash, facial/tongue/throat swelling, SOB or lightheadedness with hypotension: No Has patient had a PCN reaction causing severe rash involving mucus membranes or skin necrosis: No Has patient had a PCN reaction that required hospitalization No Has patient had a PCN reaction occurring within the last 10 years: No If all of the above answers are "NO", then may proceed with Cephalosporin use.    Family History: Family History  Problem Relation Age of Onset   Prostate cancer Father    Kidney disease Mother    Kidney cancer Neg Hx    Bladder Cancer Neg Hx     Social History:  reports that he has never smoked. He has never used smokeless tobacco. He reports current alcohol use. He reports that he does not use drugs.   Physical Exam: BP 139/87   Pulse 68   Ht '5\' 6"'$  (1.676 m)   Wt 250 lb (113.4 kg)   BMI 40.35 kg/m   Constitutional:  Alert and oriented, No acute distress. HEENT: [place] AT, moist mucus membranes.  Trachea midline, no masses. Neurologic: Grossly intact, no focal deficits, moving all 4 extremities. Psychiatric: Normal mood and affect.  Pertinent Imagine: T  abdomen without and with IV contrast   Comparison:  CT abdomen and pelvis with contrast from 08/17/2022.   Indication:  Renal cyst, N28.1 Cyst of kidney, acquired.   Technique:  CT imaging of the abdomen before and after the administration  of intravenous contrast was performed.  Noncontrast imaging through the  kidneys was followed by nephrographic phase imaging through the kidneys.  Iodinated contrast was used due to the indications for the examination, to  improve disease detection and to further define anatomy. Coronal and  sagittal reformatted images were generated and reviewed.   Findings:  - Lower Thorax: No suspicious pulmonary abnormalities. No pleural or  pericardial effusions.   - Liver:  Normal in morphology and enhancement.  No suspicious hepatic  masses are identified.  The portal and hepatic veins are patent.   - Biliary and Gallbladder: No intrahepatic or extrahepatic bile duct  dilatation. The gallbladder is surgically absent.   - Spleen: Normal in appearance.    - Pancreas: Normal in appearance.   - Adrenal Glands: Normal in appearance.   - Kidneys: Symmetric in size and enhancement. Multiple bilateral renal  cystic lesions which are predominantly simple cysts. The 2 cm endophytic  interpolar left renal lesion (4, 119) measures 35 Hounsfield units on  noncontrast images and 46 Hounsfield units on venous phase images.  Additional intermediate density right subcentimeter renal cyst has no  enhancement. Multiple hypoattenuating bilateral renal lesions which are too  small to be characterized. Cortical thinning in the posterior interpolar  left kidney is favored to represent scarring. No hydronephrosis. Large left  parapelvic cyst.   - Abdominal and Pelvic Vasculature: No abdominal aortic aneurysm.   - Gastrointestinal Tract: No abnormal dilation or wall thickening. Small  hiatal hernia with postsurgical changes of prior sleeve gastrectomy.   - Peritoneum/Mesentery/Retroperitoneum: No free fluid.  No free  intraperitoneal air.    - Lymph Nodes: No retroperitoneal or mesenteric lymphadenopathy.    - Body Wall: Unremarkable.   - Musculoskeletal:  No aggressive appearing osseous lesions.    Impression:  Left endophytic 2 cm cystic renal lesion with equivocal enhancement  technically remains indeterminate although without suspicious features.   Recommend follow-up contrast-enhanced ultrasound or abdominal MRI with and  without contrast in 6 months. This was placed in the unexpected findings  folder.   Electronically Reviewed by:  Latanya Maudlin, MD, [facility]  Electronically Reviewed on:  09/16/2022 11:13 AM   I have reviewed the images and  concur with the above findings.   Electronically Signed by:  Jasmine December, MD, [facility]  Electronically Signed on:  09/20/2022 12:46 PM   This was personally reviewed and I agree with the radiologic interpretation.   Assessment & Plan:    1. History of kidney stones - We will see him back in 2 years with another KUB. - Drink plenty of fluids  2. Left renal cyst - It has been seen and stably unchanged on serial renal ultrasounds dating back to at least 2017 or longer.  - It is most definitively benign. - It is not likely that he needs further workup, but we can monitor both his stones and this cystic lesion with an ultrasound next year.   Return in about 1 year (around 09/30/2023) for repeat KUB/ renal ultrasound.   [facility] [address] [place],  [address] [phone]

## 2023-05-12 NOTE — Progress Notes (Unsigned)
05/15/2023 11:27 AM   [name] November 28, 1970 [id number]  Referring provider: Dione Housekeeper, MD [address] [place],  [place] [address]  Urological history: 1.  Nephrolithiasis -Stone composition of 30% calcium oxalate dihydrate, 60% calcium oxalate monohydrate and 10% calcium phosphate -Right ureteroscopy (08/2021) -right ESWl (08/2021) -Left ureteroscopy (07/2016) -Bilateral ureteroscopy (03/2016) -RIght ESWL (08/2021)  -Right ureteroscopy (08/2021)  -KUB (09/2022) punctate left stone  2.  Renal cysts -RUS (09/2021) -right lower pole cyst.  Multiple left renal cysts. -Contrast CT (04/2023) - decrease in size of left endophytic hyperdense lesion now 1.4 cm from previous 2.1 cm.  Small bilateral renal cysts.   No chief complaint on file.  HPI: [name] is a 52 y.o. male who presents today for elevated PSA.   Previous records reviewed.   He was found to have a PSA of 3.51 on 05/04/2023 after a routine screening.   His previous PSA's on record are as follows; 1.63 in 2021 and 2.00 in 2023.      Score:  1-7 Mild 8-19 Moderate 20-35 Severe     Score: 1-7 Severe ED 8-11 Moderate ED 12-16 Mild-Moderate ED 17-21 Mild ED 22-25 No ED   PMH: Past Medical History:  Diagnosis Date   Acid reflux    GERD (gastroesophageal reflux disease)    Hematuria    Hypertension    Kidney stones    Sleep apnea    HAD GASTRIC SLEEVE AND HAS COME OFF OF CPAP   Urolithiasis     Surgical History: Past Surgical History:  Procedure Laterality Date   CYSTOSCOPY W/ RETROGRADES Left 08/08/2016   Procedure: CYSTOSCOPY WITH RETROGRADE PYELOGRAM;  Surgeon: Vanna Scotland, MD;  Location: [facility];  Service: Urology;  Laterality: Left;   CYSTOSCOPY W/ RETROGRADES Bilateral 03/05/2016   Procedure: CYSTOSCOPY WITH RETROGRADE PYELOGRAM;  Surgeon: Hildred Laser, MD;  Location: [facility];  Service: Urology;  Laterality: Bilateral;   CYSTOSCOPY W/ URETERAL STENT  PLACEMENT Bilateral 03/29/2016   Procedure: CYSTOSCOPY WITH STENT REPLACEMENT;  Surgeon: Vanna Scotland, MD;  Location: [facility];  Service: Urology;  Laterality: Bilateral;   CYSTOSCOPY W/ URETERAL STENT REMOVAL Left 08/08/2016   Procedure: CYSTOSCOPY WITH STENT REMOVAL;  Surgeon: Vanna Scotland, MD;  Location: [facility];  Service: Urology;  Laterality: Left;   CYSTOSCOPY WITH STENT PLACEMENT Left 07/18/2016   Procedure: CYSTOSCOPY WITH STENT PLACEMENT;  Surgeon: Vanna Scotland, MD;  Location: [facility];  Service: Urology;  Laterality: Left;   CYSTOSCOPY WITH STENT PLACEMENT Bilateral 03/05/2016   Procedure: CYSTOSCOPY WITH STENT PLACEMENT;  Surgeon: Hildred Laser, MD;  Location: [facility];  Service: Urology;  Laterality: Bilateral;   CYSTOSCOPY/RETROGRADE/URETEROSCOPY Left 07/18/2016   Procedure: CYSTOSCOPY/RETROGRADE/URETEROSCOPY;  Surgeon: Vanna Scotland, MD;  Location: [facility];  Service: Urology;  Laterality: Left;   CYSTOSCOPY/RETROGRADE/URETEROSCOPY/STONE EXTRACTION WITH BASKET  08/30/2021   Procedure: CYSTOSCOPY/RETROGRADE/URETEROSCOPY/STONE EXTRACTION WITH BASKET;  Surgeon: Vanna Scotland, MD;  Location: [facility];  Service: Urology;;   CYSTOSCOPY/URETEROSCOPY/HOLMIUM LASER/STENT PLACEMENT Right 08/30/2021   Procedure: CYSTOSCOPY/URETEROSCOPY/HOLMIUM LASER/STENT PLACEMENT;  Surgeon: Vanna Scotland, MD;  Location: [facility];  Service: Urology;  Laterality: Right;   EXTRACORPOREAL SHOCK WAVE LITHOTRIPSY Right 03/03/2016   Procedure: EXTRACORPOREAL SHOCK WAVE LITHOTRIPSY (ESWL);  Surgeon: Vanna Scotland, MD;  Location: [facility];  Service: Urology;  Laterality: Right;   EXTRACORPOREAL SHOCK WAVE LITHOTRIPSY Right 08/26/2021   Procedure: EXTRACORPOREAL SHOCK WAVE LITHOTRIPSY (ESWL);  Surgeon: Sondra Come, MD;  Location: [facility];  Service: Urology;  Laterality: Right;  KNEE SURGERY     LAPAROSCOPIC GASTRIC SLEEVE RESECTION  2014   SEPTOPLASTY Bilateral 06/07/2018   Procedure: SEPTOPLASTY;   Surgeon: Vernie Murders, MD;  Location: [facility];  Service: ENT;  Laterality: Bilateral;   STONE EXTRACTION WITH BASKET Left 07/18/2016   Procedure: STONE EXTRACTION WITH BASKET;  Surgeon: Vanna Scotland, MD;  Location: [facility];  Service: Urology;  Laterality: Left;   TURBINATE REDUCTION Bilateral 06/07/2018   Procedure: INFERIOR TURBINATE REDUCTION;  Surgeon: Vernie Murders, MD;  Location: [facility];  Service: ENT;  Laterality: Bilateral;   URETEROSCOPY Left 03/29/2016   Procedure: URETEROSCOPY;  Surgeon: Vanna Scotland, MD;  Location: [facility];  Service: Urology;  Laterality: Left;   URETEROSCOPY WITH HOLMIUM LASER LITHOTRIPSY Right 03/29/2016   Procedure: URETEROSCOPY WITH HOLMIUM LASER LITHOTRIPSY;  Surgeon: Vanna Scotland, MD;  Location: [facility];  Service: Urology;  Laterality: Right;    Home Medications:  Allergies as of 05/15/2023       Reactions   Penicillins Itching, Rash   Has patient had a PCN reaction causing immediate rash, facial/tongue/throat swelling, SOB or lightheadedness with hypotension: No Has patient had a PCN reaction causing severe rash involving mucus membranes or skin necrosis: No Has patient had a PCN reaction that required hospitalization No Has patient had a PCN reaction occurring within the last 10 years: No If all of the above answers are "NO", then may proceed with Cephalosporin use.        Medication List        Accurate as of May 12, 2023 11:27 AM. If you have any questions, ask your nurse or doctor.          famotidine 40 MG tablet Commonly known as: PEPCID Take 40 mg by mouth at bedtime.   losartan 25 MG tablet Commonly known as: COZAAR Take 25 mg by mouth daily.   omeprazole 40 MG capsule Commonly known as: PRILOSEC Take 40 mg by mouth daily.        Allergies:  Allergies  Allergen Reactions   Penicillins Itching and Rash    Has patient had a PCN reaction causing immediate rash, facial/tongue/throat  swelling, SOB or lightheadedness with hypotension: No Has patient had a PCN reaction causing severe rash involving mucus membranes or skin necrosis: No Has patient had a PCN reaction that required hospitalization No Has patient had a PCN reaction occurring within the last 10 years: No If all of the above answers are "NO", then may proceed with Cephalosporin use.    Family History: Family History  Problem Relation Age of Onset   Prostate cancer Father    Kidney disease Mother    Kidney cancer Neg Hx    Bladder Cancer Neg Hx     Social History:  reports that he has never smoked. He has never used smokeless tobacco. He reports current alcohol use. He reports that he does not use drugs.  ROS: Pertinent ROS in HPI  Physical Exam: There were no vitals taken for this visit.  Constitutional:  Well nourished. Alert and oriented, No acute distress. HEENT: [place] AT, moist mucus membranes.  Trachea midline, no masses. Cardiovascular: No clubbing, cyanosis, or edema. Respiratory: Normal respiratory effort, no increased work of breathing. GI: Abdomen is soft, non tender, non distended, no abdominal masses. Liver and spleen not palpable.  No hernias appreciated.  Stool sample for occult testing is not indicated.   GU: No CVA tenderness.  No bladder fullness or masses.  Patient with circumcised/uncircumcised phallus. ***Foreskin  easily retracted***  Urethral meatus is patent.  No penile discharge. No penile lesions or rashes. Scrotum without lesions, cysts, rashes and/or edema.  Testicles are located scrotally bilaterally. No masses are appreciated in the testicles. Left and right epididymis are normal. Rectal: Patient with  normal sphincter tone. Anus and perineum without scarring or rashes. No rectal masses are appreciated. Prostate is approximately *** grams, *** nodules are appreciated. Seminal vesicles are normal. Skin: No rashes, bruises or suspicious lesions. Lymph: No cervical or inguinal  adenopathy. Neurologic: Grossly intact, no focal deficits, moving all 4 extremities. Psychiatric: Normal mood and affect.  Laboratory Data: Prostate Specific Antigen (PSA) Total W/Reflex Prostate Specific Antigen Free, Screen Order: [id number] Component Ref Range & Units 10 d ago  PSA (Prostate Specific Antigen), Total <=2.99 ng/mL 3.51 High   Comment: [facility] PSA Screening algorithm, based on a multi-disciplinary consensus panel review of best reported practice in the literature. All recommendations and treatment decisions should be made in conjunction with the patient after discussion and counseling.  If PSA >= 3.0 ng/ml, consider referral to Urology If PSA <  3.0 ng/ml, consider screening every two years  [employer] Total-PSA Method:  The measured value of this analyte can vary depending upon the testing procedure used. Values determined on patient samples by differing testing procedures cannot be directly compared with one another, and could be cause of erroneous medical interpretation.  Resulting Agency [facility]   Specimen Collected: 05/04/23 08:29   Performed by: [facility] Last Resulted: 05/04/23 18:54  Received From: [facility]  Result Received: 05/05/23 09:26   Hemoglobin A1C Order: [id number] Component Ref Range & Units 10 d ago  Hemoglobin A1C <5.7 % 5.4  Average Blood Glucose (Calculated From HgBA1c Level) mg/dL 086  Resulting Agency [facility]  Narrative Performed by [facility] Between 5.7% and 6.4% is suggestive of Pre-Diabetes or controlled Diabetes. Greater than or equal to 6.5% is suggestive of Diabetes, and if more than one value, diagnostic.  Accuracy may be reduced by anemia, hemoglobinopathy, recent transfusion, sickle cell, artificial heart valve, dialysis, TIPS, severe hyperglycemia, etc.  Specimen Collected: 05/04/23 08:29    Performed by: [facility] Last Resulted: 05/04/23 21:37  Received From: [facility]  Result Received: 05/05/23 09:26   Lipid Panel W/Reflex Direct Low Density Lipoprotein (LDL) Cholesterol Order: [id number] Component Ref Range & Units 10 d ago  Cholesterol, Total mg/dL 528  Comment: The significance of total cholesterol depends on the values of individual components including HDL, LDL, non-HDL, and triglycerides.  LDL Calculated <190 mg/dL 79  Comment: <41 mg/dL      Desired target for prior heart disease, stroke, and those at high-risk. Even lower levels may be recommended to decrease risk of heart attack and stroke. 70-159 mg/dL   Comprehensive cardiovascular risk assessment is recommended. Statin therapy may be advised based on risk factors. 160-189 mg/dL  Moderately elevated LDL level. Statin therapy recommended if other risk factors present. >=190 mg/dL    Severely elevated LDL level. High long-term risk of heart disease and stroke. High-intensity statin therapy recommended for most people. Consider specialist referral.  *A healthy diet and exercise are recommended for all to reduce heart disease risk. Statin choice should be based on patient preference after patient-provider discussions.   Ref: 2018 ACC/AHA Guideline  HDL mg/dL 68  Comment: People with low HDL levels (see below) are at increased risk of heart  disease: <50 mg/dL for Women <16 mg/dL for Men  Triglyceride <109 mg/dL 73  Comment: <604 mg/dL      Normal 540-981 mg/dL   High Triglycerides. Risk of heart disease may be increased. Address reversible causes (eg sugar in foods and beverages, alcohol, and diabetes control). Medication may be appropriate based on other clinical factors. >=500 mg/dL     Very High Triglycerides. Risk of heart disease and pancreatitis increased. Address reversible causes as above. Medication to lower triglycerides usually advised.   *Ranges provided for  adults, pediatric guidelines vary.  Resulting Agency [facility]   Specimen Collected: 05/04/23 08:29   Performed by: [facility] Last Resulted: 05/04/23 17:42  Received From: [facility]  Result Received: 05/05/23 09:26   Complete Blood Count (CBC) Order: [id number] Component Ref Range & Units 2 wk ago  WBC (White Blood Cell Count) 3.2 - 9.8 x10^9/L 8.3  Hemoglobin 13.7 - 17.3 g/dL 62.1  Hematocrit 30.8 - 49.0 % 45.3  Platelets 150 - 450 x10^9/L 384  MCV (Mean Corpuscular Volume) 80 - 98 fL 82  MCH (Mean Corpuscular Hemoglobin) 26.5 - 34.0 pg 26.7  MCHC (Mean Corpuscular Hemoglobin Concentration) 31.5 - 36.3 % 32.7  RBC (Red Blood Cell Count) 4.37 - 5.74 x10^12/L 5.55  RDW-CV (Red Cell Distribution Width) 11.5 - 14.5 % 13.8  NRBC (Nucleated Red Blood Cell Count) 0 x10^9/L 0.00  NRBC % (Nucleated Red Blood Cell %) % 0.0  MPV (Mean Platelet Volume) 7.2 - 11.7 fL 9.4  Resulting Agency [facility]   Specimen Collected: 04/25/23 09:57   Performed by: [facility] Last Resulted: 04/25/23 10:11  Received From: [facility]  Result Received: 05/05/23 09:26   ontains abnormal data Complete Blood Count (CBC) Order: [id number] Component Ref Range & Units 3 wk ago  WBC (White Blood Cell Count) 3.2 - 9.8 x10^9/L 12.1 High   Hemoglobin 13.7 - 17.3 g/dL 95.2  Hematocrit 84.1 - 49.0 % 43.9  Platelets 150 - 450 x10^9/L 334  MCV (Mean Corpuscular Volume) 80 - 98 fL 81  MCH (Mean Corpuscular Hemoglobin) 26.5 - 34.0 pg 26.1 Low   MCHC (Mean Corpuscular Hemoglobin Concentration) 31.5 - 36.3 % 32.1  RBC (Red Blood Cell Count) 4.37 - 5.74 x10^12/L 5.40  RDW-CV (Red Cell Distribution Width) 11.5 - 14.5 % 13.5  NRBC (Nucleated Red Blood Cell Count) 0 x10^9/L 0.00  NRBC % (Nucleated Red Blood Cell %) % 0.0  MPV (Mean Platelet Volume) 7.2 - 11.7  fL 9.1  Resulting Agency [facility]   Specimen Collected: 04/18/23 04:39   Performed by: [facility] Last Resulted: 04/18/23 04:56  Received From: [facility]  Result Received: 05/05/23 13:29  I have reviewed the labs.   Pertinent Imaging: N/A  Assessment & Plan:  ***  1. Elevated PSA -discussed with patient that with a newly elevated PSA, 25% to 40% of the time it will return to baseline levels and for that reason it is advised to repeat the levels in 8 weeks (per 2023 AUA guidelines) *** -threshold values of 2.5 for people in their 40's, 3.5 for people in their 50's, 4.5 for people in their 60's, 6.5 for people in their 70's *** -We reviewed the implications of an elevated PSA and the uncertainty surrounding it. In general, a man's PSA increases with age and is produced by both normal and cancerous prostate tissue. -The differential diagnosis  for elevated PSA includes BPH, prostate cancer, infection, recent intercourse/ejaculation, recent urethroscopic manipulation (foley placement/cystoscopy) or trauma, and prostatitis *** -Management of an elevated PSA can include observation or prostate biopsy and we discussed this in detail. Our goal is to detect clinically significant prostate cancers, and manage with either active surveillance, surgery, or radiation for localized disease. Risks of prostate biopsy include bleeding, infection (including life threatening sepsis), pain, and lower urinary symptoms. Hematuria, hematospermia, and blood in the stool are all common after biopsy and can persist up to 4 weeks ***   2. BPH with LUTS -PSA velocity increased -DRE benign *** -UA benign *** -PVR < 300 cc *** -symptoms - *** -most bothersome symptoms are *** -continue conservative management, avoiding bladder irritants and timed voiding's -Initiate alpha-blocker (***), discussed side effects *** -Initiate 5 alpha reductase inhibitor (***), discussed side  effects *** -Continue tamsulosin 0.4 mg daily, alfuzosin 10 mg daily, Rapaflo 8 mg daily, terazosin, doxazosin, Cialis 5 mg daily and finasteride 5 mg daily, dutasteride 0.5 mg daily***:refills given -Cannot tolerate medication or medication failure, schedule cystoscopy ***  3. Erectile dysfunction - I explained to the patient that in order to achieve an erection it takes good functioning of the nervous system (parasympathetic and rs, sympathetic, sensory and motor), good blood flow into the erectile tissue of the penis and a desire to have sex - I explained that conditions like diabetes, hypertension, coronary artery disease, peripheral vascular disease, smoking, alcohol consumption, age, sleep apnea and BPH can diminish the ability to have an erection - I explained the ED may be a risk marker for underlying CVD and he should follow up with PCP for further studies *** - we will obtain a serum testosterone level at this time; if it is abnormal we will need to repeat the study for confirmation *** - A recent study published in Sex Med 2018 Apr 13 revealed moderate to vigorous aerobic exercise for 40 minutes 4 times per week can decrease erectile problems caused by physical inactivity, obesity, hypertension, metabolic syndrome and/or cardiovascular diseases *** - We discussed trying a *** different PDE5 inhibitor, intra-urethral suppositories, intracavernous vasoactive drug injection therapy, vacuum erection devices, LI-ESWT and penile prosthesis implantation     No follow-ups on file.  These notes generated with voice recognition software. I apologize for typographical errors.  Cloretta Ned  [facility] [address]  [address] [place], [place] [address] [phone]

## 2023-05-15 ENCOUNTER — Ambulatory Visit (INDEPENDENT_AMBULATORY_CARE_PROVIDER_SITE_OTHER): Payer: Managed Care, Other (non HMO) | Admitting: Urology

## 2023-05-15 ENCOUNTER — Encounter: Payer: Self-pay | Admitting: Urology

## 2023-05-15 VITALS — BP 129/85 | HR 66 | Ht 66.0 in | Wt 244.4 lb

## 2023-05-15 DIAGNOSIS — N401 Enlarged prostate with lower urinary tract symptoms: Secondary | ICD-10-CM

## 2023-05-15 DIAGNOSIS — N138 Other obstructive and reflux uropathy: Secondary | ICD-10-CM

## 2023-05-15 DIAGNOSIS — N529 Male erectile dysfunction, unspecified: Secondary | ICD-10-CM | POA: Diagnosis not present

## 2023-05-15 DIAGNOSIS — R972 Elevated prostate specific antigen [PSA]: Secondary | ICD-10-CM

## 2023-05-26 ENCOUNTER — Ambulatory Visit
Admission: RE | Admit: 2023-05-26 | Discharge: 2023-05-26 | Disposition: A | Discharge status: Home / Self Care | Payer: Managed Care, Other (non HMO) | Source: Ambulatory Visit | Attending: Urology | Admitting: Urology

## 2023-05-26 DIAGNOSIS — R972 Elevated prostate specific antigen [PSA]: Secondary | ICD-10-CM | POA: Insufficient documentation

## 2023-05-26 DIAGNOSIS — N401 Enlarged prostate with lower urinary tract symptoms: Secondary | ICD-10-CM | POA: Diagnosis present

## 2023-05-26 DIAGNOSIS — N138 Other obstructive and reflux uropathy: Secondary | ICD-10-CM | POA: Diagnosis present

## 2023-05-26 MED ORDER — GADOBUTROL 1 MMOL/ML IV SOLN
10.0000 mL | Freq: Once | INTRAVENOUS | Status: AC | PRN
  Administered 2023-05-26: 10 mL via INTRAVENOUS

## 2023-06-15 NOTE — Progress Notes (Signed)
06/19/2023 9:32 AM   [name] 04/17/1971 [id number]  Referring provider: Dione Housekeeper, MD [address] [place],  [place] [address]  Urological history: 1.  Nephrolithiasis -Stone composition of 30% calcium oxalate dihydrate, 60% calcium oxalate monohydrate and 10% calcium phosphate -Right ureteroscopy (08/2021) -right ESWl (08/2021) -Left ureteroscopy (07/2016) -Bilateral ureteroscopy (03/2016) -RIght ESWL (08/2021)  -Right ureteroscopy (08/2021)  -KUB (09/2022) punctate left stone -has KUB scheduled for March 2025   2.  Renal cysts -RUS (09/2021) -right lower pole cyst.  Multiple left renal cysts. -Contrast CT (04/2023) - decrease in size of left endophytic hyperdense lesion now 1.4 cm from previous 2.1 cm.  Small bilateral renal cysts.  -has RUS scheduled for March 2025  Chief Complaint  Patient presents with   Follow-up   HPI: [name] is a 52 y.o. male who presents today for prostate MRI results   Previous records reviewed.   At his visit on 05/15/2023, He was found to have a PSA of 3.51 on 05/04/2023 after a routine screening.   His previous PSA's on record are as follows; 1.63 in 2021 and 2.00 in 2023.   He has nocturia every 2 hours on occasions.   He did have been going on for quite some time, but he is more aware of it now that his PSA was found to be higher.   Patient denies any modifying or aggravating factors.  Patient denies any recent UTI's, gross hematuria, dysuria or suprapubic/flank pain.  Patient denies any fevers, chills, nausea or vomiting.   He is not sexually active.   He underwent prostate MRI on on May 26, 2023 and no focal lesions of intermediate or higher suspicion for prostate cancer was identified.  His prostate volume was 38 g.    PSAD 0.092.     PMH: Past Medical History:  Diagnosis Date   Acid reflux    GERD (gastroesophageal reflux disease)    Hematuria    Hypertension    Kidney stones    Sleep apnea     HAD GASTRIC SLEEVE AND HAS COME OFF OF CPAP   Urolithiasis     Surgical History: Past Surgical History:  Procedure Laterality Date   CYSTOSCOPY W/ RETROGRADES Left 08/08/2016   Procedure: CYSTOSCOPY WITH RETROGRADE PYELOGRAM;  Surgeon: Vanna Scotland, MD;  Location: [facility];  Service: Urology;  Laterality: Left;   CYSTOSCOPY W/ RETROGRADES Bilateral 03/05/2016   Procedure: CYSTOSCOPY WITH RETROGRADE PYELOGRAM;  Surgeon: Hildred Laser, MD;  Location: [facility];  Service: Urology;  Laterality: Bilateral;   CYSTOSCOPY W/ URETERAL STENT PLACEMENT Bilateral 03/29/2016   Procedure: CYSTOSCOPY WITH STENT REPLACEMENT;  Surgeon: Vanna Scotland, MD;  Location: [facility];  Service: Urology;  Laterality: Bilateral;   CYSTOSCOPY W/ URETERAL STENT REMOVAL Left 08/08/2016   Procedure: CYSTOSCOPY WITH STENT REMOVAL;  Surgeon: Vanna Scotland, MD;  Location: [facility];  Service: Urology;  Laterality: Left;   CYSTOSCOPY WITH STENT PLACEMENT Left 07/18/2016   Procedure: CYSTOSCOPY WITH STENT PLACEMENT;  Surgeon: Vanna Scotland, MD;  Location: [facility];  Service: Urology;  Laterality: Left;   CYSTOSCOPY WITH STENT PLACEMENT Bilateral 03/05/2016   Procedure: CYSTOSCOPY WITH STENT PLACEMENT;  Surgeon: Hildred Laser, MD;  Location: [facility];  Service: Urology;  Laterality: Bilateral;   CYSTOSCOPY/RETROGRADE/URETEROSCOPY Left 07/18/2016   Procedure: CYSTOSCOPY/RETROGRADE/URETEROSCOPY;  Surgeon: Vanna Scotland, MD;  Location: [facility];  Service: Urology;  Laterality: Left;   CYSTOSCOPY/RETROGRADE/URETEROSCOPY/STONE EXTRACTION WITH BASKET  08/30/2021   Procedure: CYSTOSCOPY/RETROGRADE/URETEROSCOPY/STONE EXTRACTION WITH  BASKET;  Surgeon: Vanna Scotland, MD;  Location: [facility];  Service: Urology;;   CYSTOSCOPY/URETEROSCOPY/HOLMIUM LASER/STENT PLACEMENT Right 08/30/2021   Procedure: CYSTOSCOPY/URETEROSCOPY/HOLMIUM LASER/STENT PLACEMENT;  Surgeon: Vanna Scotland, MD;  Location: [facility];  Service: Urology;  Laterality:  Right;   EXTRACORPOREAL SHOCK WAVE LITHOTRIPSY Right 03/03/2016   Procedure: EXTRACORPOREAL SHOCK WAVE LITHOTRIPSY (ESWL);  Surgeon: Vanna Scotland, MD;  Location: [facility];  Service: Urology;  Laterality: Right;   EXTRACORPOREAL SHOCK WAVE LITHOTRIPSY Right 08/26/2021   Procedure: EXTRACORPOREAL SHOCK WAVE LITHOTRIPSY (ESWL);  Surgeon: Sondra Come, MD;  Location: [facility];  Service: Urology;  Laterality: Right;   KNEE SURGERY     LAPAROSCOPIC GASTRIC SLEEVE RESECTION  2014   SEPTOPLASTY Bilateral 06/07/2018   Procedure: SEPTOPLASTY;  Surgeon: Vernie Murders, MD;  Location: [facility];  Service: ENT;  Laterality: Bilateral;   STONE EXTRACTION WITH BASKET Left 07/18/2016   Procedure: STONE EXTRACTION WITH BASKET;  Surgeon: Vanna Scotland, MD;  Location: [facility];  Service: Urology;  Laterality: Left;   TURBINATE REDUCTION Bilateral 06/07/2018   Procedure: INFERIOR TURBINATE REDUCTION;  Surgeon: Vernie Murders, MD;  Location: [facility];  Service: ENT;  Laterality: Bilateral;   URETEROSCOPY Left 03/29/2016   Procedure: URETEROSCOPY;  Surgeon: Vanna Scotland, MD;  Location: [facility];  Service: Urology;  Laterality: Left;   URETEROSCOPY WITH HOLMIUM LASER LITHOTRIPSY Right 03/29/2016   Procedure: URETEROSCOPY WITH HOLMIUM LASER LITHOTRIPSY;  Surgeon: Vanna Scotland, MD;  Location: [facility];  Service: Urology;  Laterality: Right;    Home Medications:  Allergies as of 06/19/2023       Reactions   Penicillin G Rash   Penicillins Itching, Rash   Has patient had a PCN reaction causing immediate rash, facial/tongue/throat swelling, SOB or lightheadedness with hypotension: No Has patient had a PCN reaction causing severe rash involving mucus membranes or skin necrosis: No Has patient had a PCN reaction that required hospitalization No Has patient had a PCN reaction occurring within the last 10 years: No If all of the above answers are "NO", then may proceed with Cephalosporin use.         Medication List        Accurate as of June 19, 2023  9:32 AM. If you have any questions, ask your nurse or doctor.          calcium carbonate 500 MG chewable tablet Commonly known as: TUMS - dosed in mg elemental calcium Chew 2 tablets by mouth 2 (two) times daily as needed.   citalopram 20 MG tablet Commonly known as: CELEXA Take 20 mg by mouth daily.   cyanocobalamin 1000 MCG/ML injection Commonly known as: VITAMIN B12 Inject 1,000 mcg into the skin every 14 (fourteen) days.   famotidine 40 MG tablet Commonly known as: PEPCID Take 40 mg by mouth at bedtime.   folic acid 1 MG tablet Commonly known as: FOLVITE Take 1 mg by mouth daily.   loratadine 10 MG tablet Commonly known as: CLARITIN Take 10 mg by mouth daily as needed for allergies.   losartan 25 MG tablet Commonly known as: COZAAR Take 25 mg by mouth daily.   RABEprazole 20 MG tablet Commonly known as: ACIPHEX Take 1 tablet by mouth 2 (two) times daily.        Allergies:  Allergies  Allergen Reactions   Penicillin G Rash   Penicillins Itching and Rash    Has patient had a PCN reaction causing immediate rash, facial/tongue/throat swelling, SOB or lightheadedness with hypotension: No Has  patient had a PCN reaction causing severe rash involving mucus membranes or skin necrosis: No Has patient had a PCN reaction that required hospitalization No Has patient had a PCN reaction occurring within the last 10 years: No If all of the above answers are "NO", then may proceed with Cephalosporin use.    Family History: Family History  Problem Relation Age of Onset   Prostate cancer Father    Kidney disease Mother    Kidney cancer Neg Hx    Bladder Cancer Neg Hx     Social History:  reports that he has never smoked. He has never used smokeless tobacco. He reports current alcohol use. He reports that he does not use drugs.  ROS: Pertinent ROS in HPI  Physical Exam: BP 121/84 (BP  Location: Left Arm, Patient Position: Sitting, Cuff Size: Large)   Pulse 71   Ht 5\' 6"  (1.676 m)   Wt 243 lb (110.2 kg)   BMI 39.22 kg/m   Constitutional:  Well nourished. Alert and oriented, No acute distress. HEENT: [place] AT, moist mucus membranes.  Trachea midline Cardiovascular: No clubbing, cyanosis, or edema. Respiratory: Normal respiratory effort, no increased work of breathing. Rectal: Patient with  normal sphincter tone. Anus and perineum without scarring or rashes. No rectal masses are appreciated. Prostate is approximately 50 + grams, could not palpate entire gland, no nodules are appreciated. Seminal vesicles could not be palpated.  Neurologic: Grossly intact, no focal deficits, moving all 4 extremities. Psychiatric: Normal mood and affect.  Laboratory Data: Complete Blood Count (CBC) Order: [id number] Component Ref Range & Units 12 d ago  WBC (White Blood Cell Count) 3.2 - 9.8 x10^9/L 7.7  Hemoglobin 13.7 - 17.3 g/dL 95.2  Hematocrit 84.1 - 49.0 % 47  Platelets 150 - 450 x10^9/L 292  MCV (Mean Corpuscular Volume) 80 - 98 fL 85  MCH (Mean Corpuscular Hemoglobin) 26.5 - 34.0 pg 26.6  MCHC (Mean Corpuscular Hemoglobin Concentration) 31.5 - 36.3 % 31.3 Low   RBC (Red Blood Cell Count) 4.37 - 5.74 x10^12/L 5.52  RDW-CV (Red Cell Distribution Width) 11.5 - 14.5 % 13.5  NRBC (Nucleated Red Blood Cell Count) 0 x10^9/L 0  NRBC % (Nucleated Red Blood Cell %) % 0  MPV (Mean Platelet Volume) 7.2 - 11.7 fL 11  Resulting Agency [facility]   Specimen Collected: 06/07/23 09:53   Performed by: [facility] Last Resulted: 06/07/23 17:31  Received From: [facility]  Result Received: 06/15/23 10:32  I have reviewed the labs.   Pertinent Imaging: N/A  Assessment & Plan:    1. Elevated PSA -We discussed the results of his prostate MRI today.  I discussed with the patient the approximately 15-20% false negative  rate on MRI.  We also discussed his enlarged prostate with PSA density of 0.092 which is reassuring at this time, but we still need to monitor for any continued uptrend -recommend follow up PSA in 6 months, he would like it repeated prior to his appointment with Dr. Apolinar Junes so she has the results available  2. BPH with LUTS -PSA velocity increased -DRE benign  -prostate MRI negative  -prostate volume 38 grams -continue conservative management, avoiding bladder irritants and timed voiding's  3. Erectile dysfunction - Not sexually active    4.  Nephrolithiasis -KUB in March  5. Renal cyst -RUS scheduled for March   Return for Keep follow up with Dr. Apolinar Junes in March .  These notes generated with voice  recognition software. I apologize for typographical errors.  Cloretta Ned  [facility] [address]  [address] [place], [place] [address] [phone]

## 2023-06-19 ENCOUNTER — Ambulatory Visit: Payer: Managed Care, Other (non HMO) | Admitting: Urology

## 2023-06-19 ENCOUNTER — Encounter: Payer: Self-pay | Admitting: Urology

## 2023-06-19 VITALS — BP 121/84 | HR 71 | Ht 66.0 in | Wt 243.0 lb

## 2023-06-19 DIAGNOSIS — N401 Enlarged prostate with lower urinary tract symptoms: Secondary | ICD-10-CM

## 2023-06-19 DIAGNOSIS — R972 Elevated prostate specific antigen [PSA]: Secondary | ICD-10-CM

## 2023-06-19 DIAGNOSIS — N200 Calculus of kidney: Secondary | ICD-10-CM

## 2023-06-19 DIAGNOSIS — N529 Male erectile dysfunction, unspecified: Secondary | ICD-10-CM

## 2023-06-19 DIAGNOSIS — N281 Cyst of kidney, acquired: Secondary | ICD-10-CM

## 2023-06-19 DIAGNOSIS — N138 Other obstructive and reflux uropathy: Secondary | ICD-10-CM

## 2023-09-26 ENCOUNTER — Ambulatory Visit
Admission: RE | Admit: 2023-09-26 | Discharge: 2023-09-26 | Disposition: A | Discharge status: Home / Self Care | Payer: Managed Care, Other (non HMO) | Source: Ambulatory Visit | Attending: Urology | Admitting: Urology

## 2023-09-26 DIAGNOSIS — N200 Calculus of kidney: Secondary | ICD-10-CM | POA: Insufficient documentation

## 2023-09-29 ENCOUNTER — Ambulatory Visit: Payer: Managed Care, Other (non HMO) | Admitting: Urology

## 2023-10-06 ENCOUNTER — Ambulatory Visit: Payer: Managed Care, Other (non HMO) | Admitting: Urology

## 2023-10-13 ENCOUNTER — Ambulatory Visit: Payer: Self-pay | Admitting: Urology

## 2023-10-27 ENCOUNTER — Ambulatory Visit: Payer: Managed Care, Other (non HMO) | Admitting: Urology

## 2023-10-27 VITALS — BP 107/72 | HR 90

## 2023-10-27 DIAGNOSIS — R972 Elevated prostate specific antigen [PSA]: Secondary | ICD-10-CM | POA: Diagnosis not present

## 2023-10-27 DIAGNOSIS — N200 Calculus of kidney: Secondary | ICD-10-CM | POA: Diagnosis not present

## 2023-10-27 DIAGNOSIS — N401 Enlarged prostate with lower urinary tract symptoms: Secondary | ICD-10-CM

## 2023-10-27 DIAGNOSIS — N138 Other obstructive and reflux uropathy: Secondary | ICD-10-CM

## 2023-10-27 NOTE — Progress Notes (Addendum)
 [name],acting as a scribe for Alex Scotland, MD.,have documented all relevant documentation on the behalf of Alex Scotland, MD,as directed by  Alex Scotland, MD while in the presence of Alex Scotland, MD.  10/27/23 1:49 PM   [name] 1970-10-15 [id number]  Referring provider: Dione Housekeeper, MD [address] [place],  [place] [address]  Chief Complaint  Patient presents with   Follow-up    renal scan results    HPI: 53 y/o male referred for further evaluation of kidney stones.   He has a personal history of kidney stones and bilateral renal cysts.   He was also seen by [name] with a slight rise in his PSA. He had a prostate MRI in 05/2023 which was negative, and a recent renal ultrasound for monitoring purposes. No stones were identified.   He has a history of elevated PSA, which was last recorded at 3.51 on 05/04/2023.   He underwent gastric surgery on Monday, which was a revision to a sleeve, and is currently on a strict hydration regimen to prevent dehydration. He reports no recent kidney stone attacks.   PMH: Past Medical History:  Diagnosis Date   Acid reflux    GERD (gastroesophageal reflux disease)    Hematuria    Hypertension    Kidney stones    Sleep apnea    HAD GASTRIC SLEEVE AND HAS Garcia OFF OF CPAP   Urolithiasis     Surgical History: Past Surgical History:  Procedure Laterality Date   CYSTOSCOPY W/ RETROGRADES Left 08/08/2016   Procedure: CYSTOSCOPY WITH RETROGRADE PYELOGRAM;  Surgeon: Alex Scotland, MD;  Location: [facility];  Service: Urology;  Laterality: Left;   CYSTOSCOPY W/ RETROGRADES Bilateral 03/05/2016   Procedure: CYSTOSCOPY WITH RETROGRADE PYELOGRAM;  Surgeon: Alex Laser, MD;  Location: [facility];  Service: Urology;  Laterality: Bilateral;   CYSTOSCOPY W/ URETERAL STENT PLACEMENT Bilateral 03/29/2016   Procedure: CYSTOSCOPY WITH STENT REPLACEMENT;  Surgeon: Alex Scotland, MD;  Location: [facility];   Service: Urology;  Laterality: Bilateral;   CYSTOSCOPY W/ URETERAL STENT REMOVAL Left 08/08/2016   Procedure: CYSTOSCOPY WITH STENT REMOVAL;  Surgeon: Alex Scotland, MD;  Location: [facility];  Service: Urology;  Laterality: Left;   CYSTOSCOPY WITH STENT PLACEMENT Left 07/18/2016   Procedure: CYSTOSCOPY WITH STENT PLACEMENT;  Surgeon: Alex Scotland, MD;  Location: [facility];  Service: Urology;  Laterality: Left;   CYSTOSCOPY WITH STENT PLACEMENT Bilateral 03/05/2016   Procedure: CYSTOSCOPY WITH STENT PLACEMENT;  Surgeon: Alex Laser, MD;  Location: [facility];  Service: Urology;  Laterality: Bilateral;   CYSTOSCOPY/RETROGRADE/URETEROSCOPY Left 07/18/2016   Procedure: CYSTOSCOPY/RETROGRADE/URETEROSCOPY;  Surgeon: Alex Scotland, MD;  Location: [facility];  Service: Urology;  Laterality: Left;   CYSTOSCOPY/RETROGRADE/URETEROSCOPY/STONE EXTRACTION WITH BASKET  08/30/2021   Procedure: CYSTOSCOPY/RETROGRADE/URETEROSCOPY/STONE EXTRACTION WITH BASKET;  Surgeon: Alex Scotland, MD;  Location: [facility];  Service: Urology;;   CYSTOSCOPY/URETEROSCOPY/HOLMIUM Garcia/STENT PLACEMENT Right 08/30/2021   Procedure: CYSTOSCOPY/URETEROSCOPY/HOLMIUM Garcia/STENT PLACEMENT;  Surgeon: Alex Scotland, MD;  Location: [facility];  Service: Urology;  Laterality: Right;   EXTRACORPOREAL SHOCK WAVE LITHOTRIPSY Right 03/03/2016   Procedure: EXTRACORPOREAL SHOCK WAVE LITHOTRIPSY (ESWL);  Surgeon: Alex Scotland, MD;  Location: [facility];  Service: Urology;  Laterality: Right;   EXTRACORPOREAL SHOCK WAVE LITHOTRIPSY Right 08/26/2021   Procedure: EXTRACORPOREAL SHOCK WAVE LITHOTRIPSY (ESWL);  Surgeon: Alex Come, MD;  Location: [facility];  Service: Urology;  Laterality: Right;   KNEE SURGERY     LAPAROSCOPIC GASTRIC SLEEVE RESECTION  2014  SEPTOPLASTY Bilateral 06/07/2018   Procedure: SEPTOPLASTY;  Surgeon: Vernie Murders, MD;  Location[facility];  Service: ENT;  Laterality: Bilateral;   STONE EXTRACTION WITH BASKET Left  07/18/2016   Procedure: STONE EXTRACTION WITH BASKET;  Surgeon: Alex Scotland, MD;  Location: [facility];  Service: Urology;  Laterality: Left;   TURBINATE REDUCTION Bilateral 06/07/2018   Procedure: INFERIOR TURBINATE REDUCTION;  Surgeon: Vernie Murders, MD;  Location: [facility];  Service: ENT;  Laterality: Bilateral;   URETEROSCOPY Left 03/29/2016   Procedure: URETEROSCOPY;  Surgeon: Alex Scotland, MD;  Location: [facility];  Service: Urology;  Laterality: Left;   URETEROSCOPY WITH HOLMIUM Garcia LITHOTRIPSY Right 03/29/2016   Procedure: URETEROSCOPY WITH HOLMIUM Garcia LITHOTRIPSY;  Surgeon: Alex Scotland, MD;  Location: [facility];  Service: Urology;  Laterality: Right;    Home Medications:  Allergies as of 10/27/2023       Reactions   Penicillin G Rash   Penicillins Itching, Rash   Has patient had a PCN reaction causing immediate rash, facial/tongue/throat swelling, SOB or lightheadedness with hypotension: No Has patient had a PCN reaction causing severe rash involving mucus membranes or skin necrosis: No Has patient had a PCN reaction that required hospitalization No Has patient had a PCN reaction occurring within the last 10 years: No If all of the above answers are "NO", then may proceed with Cephalosporin use.        Medication List        Accurate as of October 27, 2023  1:49 PM. If you have any questions, ask your nurse or doctor.          STOP taking these medications    RABEprazole 20 MG tablet Commonly known as: ACIPHEX Stopped by: [name]       TAKE these medications    acetaminophen 500 MG tablet Commonly known as: TYLENOL Take by mouth.   calcium carbonate 500 MG chewable tablet Commonly known as: TUMS - dosed in mg elemental calcium Chew 2 tablets by mouth 2 (two) times daily as needed.   citalopram 20 MG tablet Commonly known as: CELEXA Take 20 mg by mouth daily.   cyanocobalamin 1000 MCG/ML injection Commonly known as: VITAMIN  B12 Inject 1,000 mcg into the skin every 14 (fourteen) days.   enoxaparin 40 MG/0.4ML injection Commonly known as: LOVENOX Inject into the skin.   famotidine 40 MG tablet Commonly known as: PEPCID Take 40 mg by mouth at bedtime.   folic acid 1 MG tablet Commonly known as: FOLVITE Take 1 mg by mouth daily.   gabapentin 300 MG capsule Commonly known as: NEURONTIN Take by mouth.   loratadine 10 MG tablet Commonly known as: CLARITIN Take 10 mg by mouth daily as needed for allergies.   losartan 25 MG tablet Commonly known as: COZAAR Take 25 mg by mouth daily.   ondansetron 4 MG disintegrating tablet Commonly known as: ZOFRAN-ODT Take by mouth.   pantoprazole 40 MG tablet Commonly known as: PROTONIX Take by mouth.   polyethylene glycol 17 g packet Commonly known as: MIRALAX / GLYCOLAX Take by mouth.        Allergies:  Allergies  Allergen Reactions   Penicillin G Rash   Penicillins Itching and Rash    Has patient had a PCN reaction causing immediate rash, facial/tongue/throat swelling, SOB or lightheadedness with hypotension: No Has patient had a PCN reaction causing severe rash involving mucus membranes or skin necrosis: No Has patient had a PCN reaction that required hospitalization No  Has patient had a PCN reaction occurring within the last 10 years: No If all of the above answers are "NO", then may proceed with Cephalosporin use.    Family History: Family History  Problem Relation Age of Onset   Prostate cancer Father    Kidney disease Mother    Kidney cancer Neg Hx    Bladder Cancer Neg Hx     Social History:  reports that he has never smoked. He has never used smokeless tobacco. He reports current alcohol use. He reports that he does not use drugs.   Physical Exam: BP 107/72   Pulse 90   Constitutional:  Alert and oriented, No acute distress. HEENT: [place] AT, moist mucus membranes.  Trachea midline, no masses. Neurologic: Grossly intact, no focal  deficits, moving all 4 extremities. Psychiatric: Normal mood and affect.   Pertinent Imaging: EXAM: RENAL / URINARY TRACT ULTRASOUND COMPLETE  COMPARISON:  Renal ultrasound 09/13/2021  FINDINGS: Right Kidney:  Renal measurements: 11.2 x 5.1 x 5.7 cm = volume: 170.3 mL. Normal renal cortical thickness and echogenicity. No hydronephrosis. There is a 1.1 cm cyst and a 1.5 cm cyst.  Left Kidney:  Renal measurements: 12.2 x 5.8 x 5.4 cm = volume: 200 mL. Normal renal cortical thickness and echogenicity. No hydronephrosis. There is a 1.5 cm cyst and a 2.3 cm cyst.  Bladder:  Appears normal for degree of bladder distention.  Other:  None.  IMPRESSION: 1. No hydronephrosis. 2. Bilateral renal cysts.   Electronically Signed By: Annia Belt M.D. On: 09/30/2023 11:08  This has been personally reviewed and I agree with the radiologic interpretation.  Assessment & Plan:    1. History of kidney stones - No recent stone attacks reported.  - Plan to monitor with an X-ray next year due to increased risk of stone formation post-gastric bypass surgery.  - Emphasize adequate hydration and dietary citric acid intake to prevent stone formation.  2. Elevated PSA - Recent PSA was 3.41.  - Given recent catheterization during surgery, defer PSA testing for a few weeks to avoid false elevation.  - Plan to recheck PSA at a later date to ensure stability (had Foley catheter earlier this week during surgery)  3. Bilateral renal cysts - Cysts appear benign; no intervention required.   Return in about 4 weeks (around 11/24/2023) for PSA. Return in 1 year with KUB.  I have reviewed the above documentation for accuracy and completeness, and I agree with the above.   Alex Scotland, MD    [facility] [address] [place], [place] [address] [phone]

## 2023-11-02 ENCOUNTER — Other Ambulatory Visit
Admission: RE | Admit: 2023-11-02 | Discharge: 2023-11-02 | Disposition: A | Discharge status: Home / Self Care | Source: Home / Self Care | Attending: Urology | Admitting: Urology

## 2023-11-02 ENCOUNTER — Ambulatory Visit
Admission: RE | Admit: 2023-11-02 | Discharge: 2023-11-02 | Disposition: A | Discharge status: Home / Self Care | Attending: Urology | Admitting: Urology

## 2023-11-02 ENCOUNTER — Ambulatory Visit
Admission: RE | Admit: 2023-11-02 | Discharge: 2023-11-02 | Disposition: A | Discharge status: Home / Self Care | Source: Ambulatory Visit | Attending: Urology | Admitting: Urology

## 2023-11-02 DIAGNOSIS — N138 Other obstructive and reflux uropathy: Secondary | ICD-10-CM | POA: Diagnosis present

## 2023-11-02 DIAGNOSIS — R972 Elevated prostate specific antigen [PSA]: Secondary | ICD-10-CM | POA: Insufficient documentation

## 2023-11-02 DIAGNOSIS — N200 Calculus of kidney: Secondary | ICD-10-CM | POA: Diagnosis present

## 2023-11-02 DIAGNOSIS — N401 Enlarged prostate with lower urinary tract symptoms: Secondary | ICD-10-CM | POA: Diagnosis present

## 2023-11-03 LAB — PSA: Prostatic Specific Antigen: 2.26 ng/mL (ref 0.00–4.00)

## 2023-11-06 ENCOUNTER — Encounter: Payer: Self-pay | Admitting: Urology

## 2024-03-14 ENCOUNTER — Encounter: Payer: Self-pay | Admitting: Urology

## 2024-05-14 NOTE — Progress Notes (Signed)
 Chief Complaint  Patient presents with  . Sinusitis    For a month to two months and a headache lasting for awhile     Subjective  [name] is a 53 y.o. male who presents for Sinusitis (For a month to two months and a headache lasting for awhile ) HPI History of Present Illness [name] is a 53 year old male who presents with sinusitis and for renewal of citalopram.  He has been experiencing sinusitis symptoms for the past couple of weeks, including a continuous headache on the right side of his face. The right side of his nose is sore and he reports it is sometimes bloody. He denies fevers or chills. Over-the-counter Sudafed and Claritin have not provided relief. He has a history of sinus surgery six years ago, which was effective for about five years before symptoms recurred. He also experiences a sensation of hearing a noise with quick movements.  He has a history of situational depression, he is currently taking citalopram, which is effective for him, with no issues in family interactions while on the medication.  He works at a landfill.  When he has nasal congestion performs nasal flushes a couple of times a week to clear his nasal passages. He has a known allergy to penicillin but has tolerated Augmentin in the past.  He has outside labs from a metabolic clinic that needs to be drawn in our lab, including complete blood count, iron, ferritin, reticulocyte count, B12, folate, and methylmalonic acid, with the most recent labs ordered in September.  Review of Systems  Patient Active Problem List  Diagnosis  . Morbid obesity (CMS-HCC)  . Kidney stones  . Frequency of micturition  . Nocturia  . COVID-19 virus infection  . Obesity (BMI 30-39.9), unspecified  . Primary osteoarthritis of right knee  . OSA (obstructive sleep apnea)  . Preoperative evaluation of a medical condition to rule out surgical contraindications (TAR required)  . S/P gastric sleeve procedure  . Acute  post-operative pain  . H/O total knee replacement, right  . Hiatal hernia with gastroesophageal reflux  . Joint pain  . Essential hypertension  . Iron deficiency anemia secondary to inadequate dietary iron intake  . Hepatic steatosis  . Iron deficiency anemia following bariatric surgery  . Vitamin B12 deficiency  . S/P laparoscopic sleeve gastrectomy  . Obesity, Class II, BMI 35-39.9  . Heartburn  . Vitamin D deficiency  . Acute upper GI bleeding  . Intractable pain, unspecified  . S/P gastric bypass  . Morbid obesity with BMI of 40.0-44.9, adult (CMS-HCC)    Outpatient Medications Prior to Visit  Medication Sig Dispense Refill  . famotidine (PEPCID) 40 MG tablet TAKE 1 TABLET BY MOUTH AT BEDTIME 90 tablet 3  . folic acid (FOLVITE) 1 MG tablet Take 1 tablet by mouth once daily 90 tablet 0  . multivitamin capsule Take 1 capsule by mouth once daily    . citalopram (CELEXA) 20 MG tablet Take 1 tablet (20 mg total) by mouth once daily 30 tablet 11  . cyanocobalamin (VITAMIN B12) 1,000 mcg/mL injection Inject 1 mL (1,000 mcg total) subcutaneously every 14 (fourteen) days (Patient not taking: Reported on 11/20/2023) 2 mL 11  . gabapentin (NEURONTIN) 300 MG capsule Take 1 capsule (300 mg total) by mouth 3 (three) times daily for 10 days (Patient not taking: Reported on 10/10/2023) 30 capsule 0  . pantoprazole  (PROTONIX ) 40 MG DR tablet Take 1 tablet (40 mg total) by mouth once daily  90 tablet 3   No facility-administered medications prior to visit.      Objective  Vitals:   05/14/24 0811  BP: 122/83  Pulse: 69  SpO2: 97%  Weight: (!) 103.9 kg (229 lb)  PainSc:   3  PainLoc: Head - Frontal   Body mass index is 36.96 kg/m.  Home Vitals:     Physical Exam Physical Exam HEENT: Right nasal passage with significant swelling and tenderness.  Constitutional: alert, in NAD, and communicates well Eye exam: pupils equal and reactive, extraocular eye movements intact. Neck: supple,  no thyroid enlargement or cervical adenopathy, and no bruits heard Respiratory: clear to auscultation, without rales or wheezes  Cardiovascular: regular rate and rhythm and without murmurs, rubs or gallops Lower extremities: no lower extremity edema Skin ankles/feet: warm, good capillary refill Neurological: sensorimotor grossly intact and normal muscle tone  Results       Assessment/Plan:   Assessment & Plan Acute sinusitis Acute sinusitis with continuous headache and facial tenderness on the right side. Symptoms returned five years post-turbinate reduction and deviated septum surgery. No fever or chills. Nasal congestion and epistaxis present. - Prescribe Augmentin (amoxicillin/clavulanate) for broad-spectrum coverage of upper respiratory infection. - Prescribe Atrovent (ipratropium bromide) nasal spray, two sprays in each nostril three times a day until swelling reduces. - Consider referral to an ENT specialist if symptoms persist.  Adjustment disorder with depressed mood Adjustment disorder with depressed mood, well-managed with citalopram. Reports good response to medication. - Renew citalopram prescription.  General Health Maintenance Due for annual physical examination. Last physical on October 3rd of the previous year. 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 - Administer influenza vaccine. - Administer COVID-19 vaccine. - Administer pneumonia vaccine for individuals over 25 years old. Diagnoses and all orders for this visit:  Acute bacterial sinusitis -     amoxicillin-clavulanate (AUGMENTIN) 875-125 mg tablet; Take 1 tablet (875 mg total) by mouth every 12 (twelve) hours for 10 days -     ipratropium (ATROVENT) 0.06 % nasal spray; Place 2 sprays into both nostrils 3 (three) times daily for 14 days  Situational depression -     citalopram (CELEXA) 20 MG tablet; Take 1 tablet (20 mg total)  by mouth once daily  Dietary counseling and surveillance -     25 OH Vitamin D -     Complete Blood Count (CBC) -     Comprehensive Metabolic Panel (CMP) -     Ferritin -     Folate -     Hemoglobin A1C -     Iron and Total Iron Binding Capacity (TIBC) -     Magnesium  -     Phosphorus -     Parathyroid Hormone (PTH) -     Thyroid Profile (TSH and T4 Free) -     Vitamin B12 -     Copper, Serum or Plasma - [facility] -     Vitamin A -     Vitamin B1 (Thiamine), Blood -     Vitamin K1 - [facility] -     Zinc, Serum or Plasma - [facility]  Status post bariatric surgery -     25 OH Vitamin D -     Complete Blood Count (CBC) -     Comprehensive Metabolic Panel (CMP) -     Ferritin -     Folate -     Hemoglobin A1C -  Iron and Total Iron Binding Capacity (TIBC) -     Magnesium  -     Phosphorus -     Parathyroid Hormone (PTH) -     Thyroid Profile (TSH and T4 Free) -     Vitamin B12 -     Copper, Serum or Plasma - [facility] -     Vitamin A -     Vitamin B1 (Thiamine), Blood -     Vitamin K1 - [facility] -     Zinc, Serum or Plasma - [facility]  S/P gastric bypass -     25 OH Vitamin D -     Complete Blood Count (CBC) -     Comprehensive Metabolic Panel (CMP) -     Ferritin -     Folate -     Hemoglobin A1C -     Iron and Total Iron Binding Capacity (TIBC) -     Magnesium  -     Phosphorus -     Parathyroid Hormone (PTH) -     Thyroid Profile (TSH and T4 Free) -     Vitamin B12 -     Copper, Serum or Plasma - [facility] -     Vitamin A -     Vitamin B1 (Thiamine), Blood -     Vitamin K1 - [facility] -     Zinc, Serum or Plasma - [facility]  Postsurgical malabsorption (HHS-HCC) -     25 OH Vitamin D -     Complete Blood Count (CBC) -     Comprehensive Metabolic Panel (CMP) -     Ferritin -     Folate -     Hemoglobin A1C -     Iron and Total Iron Binding Capacity (TIBC) -     Magnesium  -     Phosphorus -      Parathyroid Hormone (PTH) -     Thyroid Profile (TSH and T4 Free) -     Vitamin B12 -     Copper, Serum or Plasma - [facility] -     Vitamin A -     Vitamin B1 (Thiamine), Blood -     Vitamin K1 - [facility] -     Zinc, Serum or Plasma - [facility]    This visit was coded based on medical decision making (MDM).           Future Appointments     Date/Time Provider Department Center Visit Type   06/17/2024 8:30 AM Samuella Twyla Rocks, PA [facility] C FOLLOW UP   10/24/2024 1:00 PM (Arrive by 12:45 PM) Costella Clarity, RD [facility]URN [facility]   11/06/2024 1:20 PM (Arrive by 1:05 PM) Kay Delon Roses, PA [id number] VIDEO VISIT RETURN   12/16/2024 7:30 AM (Arrive by 7:00 AM) LAB(PHLEBOTOMY)- [facility]   12/16/2024 8:30 AM (Arrive by 8:00 AM) Tonette Lorane Reasoner, PA [facility]URN [facility]       There are no Patient Instructions on file for this visit.  An after visit summary was provided for the patient either in written format (printed) or through [facility].  This note has been created using automated tools and reviewed for accuracy by MARIO E OLMEDO.

## 2024-05-14 NOTE — Telephone Encounter (Signed)
 Reviewed note from today and called the patient.  Patient has tolerated doxycycline in the past.  Will switch to Augmentin to doxycycline.  Sent to pharmacy.

## 2024-06-04 NOTE — Progress Notes (Signed)
 Referring Provider:   Eliverto Bette Hover, Md [address] [place],  [place] [address]  Chief Complaint:   Sinusitis   Subjective:    [name] is a 53 y.o. male who is seen in consultation from Dr. Eliverto for evaluation of sinusitis.   History of Present Illness [name] is a 53 year old male with a history of sinus surgery who presents with chronic sinus headaches and nasal symptoms.  For the past six months, he has experienced persistent sinus headaches and pressure, primarily located behind the right eye, which are becoming more constant. Bright lights exacerbate these headaches. He recently completed a course of Augmentin prescribed by his primary care physician on October 14, which did not alleviate his symptoms. He has a history of migraines.  He reports frequent epistaxis, usually on the right side, with the most recent occurrence being yesterday. He also experiences nasal congestion, predominantly on the right side, and describes a sensation of tightness in the right ear. 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 He has also been using ipratropium bromide (Atrovent) nasal spray which was prescribed by his PCP.  He has a history of sinus surgery, specifically a septoplasty and turbinate reduction, performed in 2019.  He recalls a childhood incident where he was hit in the nose with a baseball, which may have resulted in a fracture, but no surgical intervention was required at that time.  No changes in hearing, ear pain, or drainage, but his ears are often waxy and itchy. He has a history of acid reflux and previously took multiple medications for it before undergoing stomach surgery. Currently, he takes only one medication for acid reflux.  The history was obtained by review of the patient's medical records and from the patient.   Past Medical History, Surgical History, Medications, Allergies, Social History:  Past  medical, surgical, social history including medications and allergies are reviewed.   Review of Systems A full review of systems was obtained from the patient and is documented in the Nursing Progress note. I have reviewed this information and discussed as appropriate with the patient; all other systems are negative.  Radiology:   No radiology reviewed.     Objective:   Audiometric Data: None  Vital Signs: BP (!) 142/80 (BP Location: Right upper arm, Patient Position: Sitting, BP Cuff Size: Large Adult)   Pulse 66   Temp 36.4 C (97.5 F) (Temporal)   Wt (!) 103.9 kg (229 lb)   BMI 36.96 kg/m   General: General appearance of this patient is normal with regard to development, nutrition, attention to grooming, and lack of deformities. Patient's ability to communicate is normal.  Neurologic and Psychiatric: The patient was oriented to person, place, and time.  The patient's mood and affect appear normal.  Head and Face: The overall appearance of the head and face is normal.   External Ears: External inspection of the ears is normal with no evidence of otorrhea, erythema, edema, lesions, or scars. The auricles are normal in appearance.   Right Ear: Inspection of the RIGHT external auditory canal reveals mild cerumen.  This is debrided without trauma. There are no masses, infection, inflammation, or obstruction of the canal.   The RIGHT tympanic membrane is normal. There is no evidence of perforation or retraction of the tympanic membrane. No infection, otorrhea or inflammation. No middle ear effusion or masses are visualized.  Left Ear: Inspection of the LEFT  external auditory canal reveals no erythema, edema, or otorrhea. There are no masses, debris, or obstruction of the canal.   The LEFT tympanic membrane is normal. There is no evidence of perforation or retraction of the tympanic membrane. No infection, otorrhea or inflammation. No middle ear effusion or masses are  visualized.   Nose: Inspection of the external nose reveals an intact nasal bridge without external deviation. Anterior rhinoscopy reveals inflamed/bloody friable mucosa along the right anterior septum. Normal on the left, except for pinpoint area of bleeding.  Patent nasal passages bilaterally. Turbinates are normal bilaterally. Septum is intact and midline.    Mouth/Oral Cavity: The oral cavity is normal without lesions or masses. Teeth are normal. The hard palate is normal. The soft palate and uvula is normal. The tonsils are normal, symmetric and non-obstructive without infection or lesion.    Neck and Lymph Nodes: Overall appearance of the neck is normal and symmetric. No palpable masses or lymphadenopathy. Neck musculature is normal without tenderness to palpation.    Respiratory: The patient appears comfortable and is breathing and speaking normally without effort.   Cardiovascular: Did Not Examine.   Skin: Did Not Examine.     Procedures: PROCEDURE:  NASAL ENDOSCOPY  The decision was made to perform this procedure to evaluate the patient's nasal passages for evaluation of infection, mass, epistaxis and/or obstruction. The nasal passages were not anesthestized. The scope was used to evaluate both nasal passages and was patent to the nasopharynx. Nasal anatomy was normal. The mucosa was inflamed with bloody friable mucosa of the right anterior septum. The turbinates were abnormal, consistent with prior surgical resection.  There was no epistaxis, polyp, mucopurulence, and or masses noted. The nasopharynx was examined and was normal without mass or lesion and with clear rhinorrhea. The eustachian tube orifices were identified and were normal.   Right     Left           Diagnosis for this encounter:      ICD-10-CM   1. Chronic sinusitis, unspecified location  J32.9 CT brain lab sinus without contrast    2. Allergic rhinitis, unspecified seasonality, unspecified  trigger  J30.9     3. Atypical facial pain  G50.1     4. Non-ulcerative nasal mucositis  J34.81     5. Ear itching  L29.9       Discussion and Plan:    Assessment & Plan Chronic rhinosinusitis with right-sided nasal mucositis Chronic right sided headache and sinus pressure with right-sided nasal mucositis. Recent Augmentin course was ineffective. Examination reveals friable and irritated mucosa of the right anterior septum.  Nasal endoscopy did not reveal any evidence of mucopurulence or nasal polyposis.  Differential includes sinus infection or migraine-related atypical facial pain. - Apply mupirocin cream inside both nostrils twice daily for 2-3 weeks for the nasal mucositis - Ordered CT scan of the sinuses to assess for chronic sinus inflammation/infection - Scheduled follow-up appointment after CT scan to review results and assess improvement.  Allergic rhinitis Contributing to nasal congestion and clear mucus drainage. Examination shows cobblestoning, indicative of seasonal allergies. Nasal steroids can exacerbate epistaxis, so azelastine is recommended as an alternative. - Prescribed azelastine (Astelin) nasal spray, one spray in each nostril twice daily. - Can also use OTC for a slightly stronger dosage - Continue saline nasal rinses with distilled water.  Atypical facial pain Possibly related to sinus issues or migraine. Symptoms include sinus headache and pressure, exacerbated by bright lights. No evidence of sinus infection  on nasal endoscopy.  - Monitor symptoms and assess response to azelastine and mupirocin treatment. - Will consider further evaluation, including possible referral to neurology, if symptoms persist after CT scan and treatment. - Can try OTC supplements Magnesium , Vitamin B2, and CoEnzyme Q10 to help with headaches  Pruritus and dry skin of external ear Pruritus and dry skin of the external ear, likely due to dry and flaky skin. Examination shows dry, flaky  skin on the outer ear. - Apply 3-4 drops of mineral oil or baby oil in the ears once a week to hydrate the skin and reduce itching.   Patient Education:  Patient and family education and communication were provided via verbal communication. No barriers to education were identified. Patient, family, and/or caretaker verbally expressed understanding of information provided.   Time and Medical Decision Making:   I spent a total of 25 minutes in both face-to-face and non-face-to-face activities for this visit on the date of this encounter.  Decision for Billing Based on Time:  Decision based on Medical Decision Making  Decision for Billing Based on Medical Decision Making (Needs 2 out of 3): NUMBER AND COMPLEXITY OF PROBLEMS ADDRESSED:  Level 4 Moderate: 1 undiagnosed new problem with uncertain prognosis AMOUNT AND COMPLEXITY OF DATA TO BE REVIEWED:  Level 3 Low: Category 1: (Need 2): Reviewed unique external notes; Review unique test results; Ordered unique tests RISK OF COMPLICATION/MORBIDITY/MORTALITY OF PATIENT MANAGEMENT Level 4 Moderate: Moderate Risk (Prescription, Minor Surgery with risk factors, Elective Major Surgery, Social Determinants)   Attestation Statement:   I personally performed the service, non-incident to. (WP)   BRENDAN MILLS CAIN, NP This note has been created using automated tools and reviewed for accuracy by [facility] MILLS CAIN.

## 2024-06-10 ENCOUNTER — Other Ambulatory Visit: Payer: Self-pay

## 2024-06-10 ENCOUNTER — Emergency Department
Admission: EM | Admit: 2024-06-10 | Discharge: 2024-06-10 | Disposition: A | Discharge status: Home / Self Care | Payer: Worker's Compensation

## 2024-06-10 ENCOUNTER — Emergency Department: Payer: Worker's Compensation

## 2024-06-10 DIAGNOSIS — Y990 Civilian activity done for income or pay: Secondary | ICD-10-CM | POA: Insufficient documentation

## 2024-06-10 DIAGNOSIS — I10 Essential (primary) hypertension: Secondary | ICD-10-CM | POA: Insufficient documentation

## 2024-06-10 DIAGNOSIS — S6992XA Unspecified injury of left wrist, hand and finger(s), initial encounter: Secondary | ICD-10-CM | POA: Diagnosis present

## 2024-06-10 DIAGNOSIS — W268XXA Contact with other sharp object(s), not elsewhere classified, initial encounter: Secondary | ICD-10-CM | POA: Insufficient documentation

## 2024-06-10 DIAGNOSIS — S61315A Laceration without foreign body of left ring finger with damage to nail, initial encounter: Secondary | ICD-10-CM | POA: Insufficient documentation

## 2024-06-10 DIAGNOSIS — Z23 Encounter for immunization: Secondary | ICD-10-CM | POA: Insufficient documentation

## 2024-06-10 MED ORDER — TETANUS-DIPHTH-ACELL PERTUSSIS 5-2-15.5 LF-MCG/0.5 IM SUSP
0.5000 mL | Freq: Once | INTRAMUSCULAR | Status: AC
  Administered 2024-06-10: 0.5 mL via INTRAMUSCULAR
  Filled 2024-06-10: qty 0.5

## 2024-06-10 MED ORDER — LIDOCAINE-EPINEPHRINE-TETRACAINE (LET) TOPICAL GEL
3.0000 mL | Freq: Once | TOPICAL | Status: AC
  Administered 2024-06-10: 3 mL via TOPICAL
  Filled 2024-06-10: qty 3

## 2024-06-10 MED ORDER — LIDOCAINE HCL (PF) 1 % IJ SOLN
5.0000 mL | Freq: Once | INTRAMUSCULAR | Status: AC
  Administered 2024-06-10: 5 mL
  Filled 2024-06-10: qty 5

## 2024-06-10 NOTE — ED Notes (Signed)
Provider at the bedside for lac repair

## 2024-06-10 NOTE — Discharge Instructions (Signed)
You have been seen in the Emergency Department (ED) today for a laceration (cut).  Please keep the cut clean but do not submerge it in the water.  It has been repaired with staples or sutures that will need to be removed in about 7-10 days. Please follow up with your doctor, an urgent care, or return to the ED for suture removal.    Please take Tylenol (acetaminophen) or Motrin (ibuprofen) as needed for discomfort as written on the box.   Please follow up with your doctor as soon as possible regarding today's emergent visit.   Return to the ED or call your doctor if you notice any signs of infection such as fever, increased pain, increased redness, pus, or other symptoms that concern you.

## 2024-06-10 NOTE — ED Provider Notes (Signed)
 [facility] Provider Note    None    (approximate)   History   Laceration   HPI  [name] is a 53 y.o. male  with a past medical history of HTN, GERD, urolithiasis presents to the emergency department with suspected left ring finger laceration after an injury around 2 p.m. today. 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 Finger was wrapped by coworker. Unsure of last tetanus injection. Denies fever, chills, pus-like drainage, numbness or tingling. Denies any other injuries, did not hit his head. No blood thinner usage. Has not taken any medication for pain.    Does not currently take any blood pressure medication.  Patient states he used to be on losartan  and he was taken off of it and February to March of this year because his blood pressure was regulated appropriately.  He does have a PCP.  He denies headaches, vision changes, dizziness, nausea, lightheadedness, chest pain, abdominal pain, back pain, any other symptoms.   Physical Exam   Triage Vital Signs: ED Triage Vitals  Encounter Vitals Group     BP 06/10/24 1450 (!) 150/103     Girls Systolic BP Percentile --      Girls Diastolic BP Percentile --      Boys Systolic BP Percentile --      Boys Diastolic BP Percentile --      Pulse Rate 06/10/24 1450 83     Resp 06/10/24 1450 18     Temp 06/10/24 1450 98 F (36.7 C)     Temp Source 06/10/24 1450 Oral     SpO2 06/10/24 1450 98 %     Weight 06/10/24 1451 225 lb (102.1 kg)     Height 06/10/24 1451 5' 6 (1.676 m)     Head Circumference --      Peak Flow --      Pain Score 06/10/24 1451 5     Pain Loc --      Pain Education --      Exclude from Growth Chart --     Most recent vital signs: Vitals:   06/10/24 1540 06/10/24 1720  BP: (!) 140/119 (!) 132/93  Pulse:  82  Resp:  16  Temp:    SpO2:  97%    General: Awake, in no acute  distress.  Head: Normocephalic, atraumatic. CV: Good peripheral perfusion. Radial pulses 2+ b/l. Cap refill <2 sec. Respiratory:Normal respiratory effort.  No respiratory distress.  GI: Soft, non-distended. MSK: Normal ROM and 5/5 strength in all b/l finger joints. Skin:Warm, dry, see media image below of fingers.  Once cleaned up, left pinky nail had a small elevation of the part of the nail but no laceration.  Left ring finger with 4 cm laceration on the medial side along the end of the finger and DIP joint, extending into the nail but no nailbed involvement. Neurological: A&Ox4 to person, place, time, and situation. Sensation intact and equal to b/l fingers. Strength symmetric.     ED Results / Procedures / Treatments   Labs (all labs ordered are listed, but only abnormal results are displayed) Labs Reviewed - No data to display   EKG     RADIOLOGY X ray left hand ordered.  FINDINGS:   BONES AND JOINTS: No acute fracture. No focal osseous lesion. No joint dislocation.   SOFT TISSUES: There is soft  tissue swelling of the distal fourth phalanx. There is no foreign body.   IMPRESSION: 1. Soft tissue swelling of the distal fourth phalanx, likely related to the reported laceration. 2. No acute osseous abnormality or foreign body.   PROCEDURES:  Critical Care performed: No   .Laceration Repair  Date/Time: 06/10/2024 5:10 PM  Performed by: Sheron, Allyiah Gartner, PA-C Authorized by: Sheron Salm, PA-C   Consent:    Consent obtained:  Verbal   Consent given by:  Patient   Risks, benefits, and alternatives were discussed: yes     Risks discussed:  Infection, nerve damage, need for additional repair, poor cosmetic result, pain, retained foreign body, tendon damage, poor wound healing and vascular damage Universal protocol:    Procedure explained and questions answered to patient or proxy's satisfaction: yes     Immediately prior to procedure, a time out was called: yes      Patient identity confirmed:  Verbally with patient Anesthesia:    Anesthesia method:  Topical application and local infiltration   Topical anesthetic:  LET   Local anesthetic:  Lidocaine  1% w/o epi Laceration details:    Location:  Finger   Finger location:  L ring finger   Length (cm):  4 Pre-procedure details:    Preparation:  Patient was prepped and draped in usual sterile fashion Exploration:    Hemostasis achieved with:  LET and direct pressure   Imaging obtained: x-ray     Imaging outcome: foreign body not noted     Wound exploration: wound explored through full range of motion and entire depth of wound visualized     Wound extent: no foreign body, no signs of injury, no nerve damage, no tendon damage, no underlying fracture and no vascular damage     Contaminated: no   Treatment:    Area cleansed with:  Povidone-iodine   Amount of cleaning:  Standard   Irrigation solution:  Sterile saline   Irrigation volume:  60 mL   Irrigation method:  Syringe Skin repair:    Repair method:  Sutures   Suture size:  5-0   Suture material:  Nylon   Suture technique:  Simple interrupted   Number of sutures:  5 Approximation:    Approximation:  Close Repair type:    Repair type:  Simple Post-procedure details:    Dressing:  Sterile dressing   Procedure completion:  Tolerated well, no immediate complications     MEDICATIONS ORDERED IN ED: Medications  Tdap (ADACEL) injection 0.5 mL (0.5 mLs Intramuscular Given 06/10/24 1514)  lidocaine -EPINEPHrine -tetracaine (LET) topical gel (3 mLs Topical Given 06/10/24 1515)  lidocaine  (PF) (XYLOCAINE ) 1 % injection 5 mL (5 mLs Other Given 06/10/24 1630)     IMPRESSION / MDM / ASSESSMENT AND PLAN / ED COURSE  I reviewed the triage vital signs and the nursing notes.                              Differential diagnosis includes, but is not limited to, laceration of the left ring finger with nail involvement, abrasion, distal tuft fracture,  foreign body of the finger  Patient's presentation is most consistent with acute complicated illness / injury requiring diagnostic workup.  Patient is a 53 year old male here with left ring finger laceration that extends into the nail.  Bleeding was controlled with direct pressure and LET.  X-ray of left hand ordered. I independently viewed the x-ray and radiologist's report.  I agree  with the radiologist's report that there are no acute findings other than the soft tissue swelling of the distal fourth phalanx.  Digital block used.  Tetanus updated.  Please see procedure note for full details regarding repair of lacerated skin and nail.  Five 5-0 simple interrupted nylon sutures were placed, 4 in the skin and 1 in the nail.  Sterile dressing applied.  Wound care instructions discussed with the patient.  He will need to get the sutures removed in 7 to 10 days in urgent care, emergency department or with his PCP.  Blood pressure was elevated here today but has decreased significantly while monitoring. Work note provided.  Patient asked for 3 days off so he did not have to go back to work with fear of contamination of his finger.  The patient may return to the emergency department for any new, worsening, or concerning symptoms. Patient was given the opportunity to ask questions; all questions were answered. Emergency department return precautions were discussed with the patient.  Patient is in agreement to the treatment plan.  Patient is stable for discharge.   FINAL CLINICAL IMPRESSION(S) / ED DIAGNOSES   Final diagnoses:  Laceration of left ring finger with damage to nail w/o foreign body, initial encounter     Rx / DC Orders   ED Discharge Orders     None        Note:  This document was prepared using Dragon voice recognition software and may include unintentional dictation errors.     Sheron Salm, PA-C 06/10/24 1723    Clarine Ozell LABOR, MD 06/10/24 [phone]

## 2024-06-10 NOTE — ED Triage Notes (Signed)
 Pt to ED via POV from work. 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 Pt has large laceration to left ring finger. Lac wrapped by coworker.    Pt works for [employer]

## 2024-07-19 ENCOUNTER — Encounter: Payer: Self-pay | Admitting: Urology

## 2024-10-16 ENCOUNTER — Ambulatory Visit: Admitting: Urology

## 2024-10-18 ENCOUNTER — Ambulatory Visit: Admitting: Urology
